# Patient Record
Sex: Male | Born: 1950 | Race: White | Hispanic: No | Marital: Married | Smoking: Never smoker
Health system: Southern US, Community
[De-identification: ages and names within clinical notes are randomized; demographics above are authoritative.]

## PROBLEM LIST (undated history)

## (undated) DIAGNOSIS — Z8639 Personal history of other endocrine, nutritional and metabolic disease: Secondary | ICD-10-CM

## (undated) DIAGNOSIS — N182 Chronic kidney disease, stage 2 (mild): Secondary | ICD-10-CM

## (undated) DIAGNOSIS — M109 Gout, unspecified: Secondary | ICD-10-CM

## (undated) DIAGNOSIS — J069 Acute upper respiratory infection, unspecified: Secondary | ICD-10-CM

## (undated) DIAGNOSIS — I340 Nonrheumatic mitral (valve) insufficiency: Secondary | ICD-10-CM

## (undated) DIAGNOSIS — F419 Anxiety disorder, unspecified: Secondary | ICD-10-CM

## (undated) DIAGNOSIS — I4891 Unspecified atrial fibrillation: Secondary | ICD-10-CM

## (undated) DIAGNOSIS — N400 Enlarged prostate without lower urinary tract symptoms: Secondary | ICD-10-CM

## (undated) DIAGNOSIS — G4730 Sleep apnea, unspecified: Secondary | ICD-10-CM

## (undated) DIAGNOSIS — E669 Obesity, unspecified: Secondary | ICD-10-CM

## (undated) DIAGNOSIS — H6690 Otitis media, unspecified, unspecified ear: Secondary | ICD-10-CM

## (undated) DIAGNOSIS — N3941 Urge incontinence: Secondary | ICD-10-CM

## (undated) DIAGNOSIS — E291 Testicular hypofunction: Secondary | ICD-10-CM

## (undated) HISTORY — DX: Testicular hypofunction: E29.1

## (undated) HISTORY — DX: Anxiety disorder, unspecified: F41.9

## (undated) HISTORY — DX: Benign prostatic hyperplasia without lower urinary tract symptoms: N40.0

## (undated) HISTORY — DX: Chronic kidney disease, stage 2 (mild): N18.2

## (undated) HISTORY — DX: Otitis media, unspecified, unspecified ear: H66.90

## (undated) HISTORY — DX: Unspecified atrial fibrillation: I48.91

## (undated) HISTORY — DX: Nonrheumatic mitral (valve) insufficiency: I34.0

## (undated) HISTORY — DX: Sleep apnea, unspecified: G47.30

## (undated) HISTORY — DX: Gout, unspecified: M10.9

## (undated) HISTORY — DX: Personal history of other endocrine, nutritional and metabolic disease: Z86.39

## (undated) HISTORY — DX: Urge incontinence: N39.41

## (undated) HISTORY — PX: TRANSTHORACIC ECHOCARDIOGRAM: SHX275

## (undated) HISTORY — DX: Acute upper respiratory infection, unspecified: J06.9

## (undated) HISTORY — DX: Obesity, unspecified: E66.9

---

## 1998-03-07 HISTORY — PX: HEMORRHOID SURGERY: SHX153

## 2001-03-07 HISTORY — PX: COLONOSCOPY: SHX174

## 2003-07-21 ENCOUNTER — Inpatient Hospital Stay (HOSPITAL_BASED_OUTPATIENT_CLINIC_OR_DEPARTMENT_OTHER): Admission: RE | Admit: 2003-07-21 | Discharge: 2003-07-21 | Payer: Self-pay | Admitting: Cardiovascular Disease

## 2004-05-13 ENCOUNTER — Ambulatory Visit: Payer: Self-pay | Admitting: Family Medicine

## 2005-01-17 ENCOUNTER — Ambulatory Visit: Payer: Self-pay | Admitting: Family Medicine

## 2005-02-11 ENCOUNTER — Ambulatory Visit: Payer: Self-pay | Admitting: Family Medicine

## 2005-03-07 HISTORY — PX: MITRAL VALVE REPAIR: SHX2039

## 2005-03-29 ENCOUNTER — Ambulatory Visit: Payer: Self-pay | Admitting: Family Medicine

## 2005-04-18 ENCOUNTER — Ambulatory Visit: Payer: Self-pay | Admitting: Family Medicine

## 2005-05-04 ENCOUNTER — Ambulatory Visit: Payer: Self-pay | Admitting: Family Medicine

## 2005-06-16 ENCOUNTER — Ambulatory Visit: Payer: Self-pay | Admitting: Family Medicine

## 2005-06-27 ENCOUNTER — Ambulatory Visit: Payer: Self-pay | Admitting: Family Medicine

## 2005-07-18 ENCOUNTER — Ambulatory Visit: Payer: Self-pay | Admitting: Family Medicine

## 2005-11-29 ENCOUNTER — Encounter: Payer: Self-pay | Admitting: Cardiovascular Disease

## 2005-11-29 ENCOUNTER — Ambulatory Visit: Payer: Self-pay

## 2005-12-08 ENCOUNTER — Ambulatory Visit: Payer: Self-pay | Admitting: Cardiovascular Disease

## 2005-12-12 ENCOUNTER — Ambulatory Visit (HOSPITAL_COMMUNITY): Admission: RE | Admit: 2005-12-12 | Discharge: 2005-12-12 | Payer: Self-pay | Admitting: Cardiovascular Disease

## 2005-12-12 ENCOUNTER — Ambulatory Visit: Payer: Self-pay | Admitting: Cardiovascular Disease

## 2005-12-26 ENCOUNTER — Ambulatory Visit: Payer: Self-pay | Admitting: Cardiovascular Disease

## 2005-12-28 ENCOUNTER — Ambulatory Visit: Payer: Self-pay | Admitting: Family Medicine

## 2005-12-30 ENCOUNTER — Inpatient Hospital Stay (HOSPITAL_BASED_OUTPATIENT_CLINIC_OR_DEPARTMENT_OTHER): Admission: RE | Admit: 2005-12-30 | Discharge: 2005-12-30 | Payer: Self-pay | Admitting: Cardiovascular Disease

## 2005-12-30 ENCOUNTER — Ambulatory Visit: Payer: Self-pay | Admitting: Cardiovascular Disease

## 2006-01-11 ENCOUNTER — Ambulatory Visit: Payer: Self-pay | Admitting: Family Medicine

## 2006-01-17 ENCOUNTER — Inpatient Hospital Stay (HOSPITAL_COMMUNITY)
Admission: RE | Admit: 2006-01-17 | Discharge: 2006-01-22 | Payer: Self-pay | Admitting: Thoracic Surgery (Cardiothoracic Vascular Surgery)

## 2006-01-17 ENCOUNTER — Encounter (INDEPENDENT_AMBULATORY_CARE_PROVIDER_SITE_OTHER): Payer: Self-pay | Admitting: *Deleted

## 2006-02-02 ENCOUNTER — Ambulatory Visit: Payer: Self-pay | Admitting: Cardiovascular Disease

## 2006-02-14 ENCOUNTER — Ambulatory Visit: Payer: Self-pay | Admitting: Cardiovascular Disease

## 2006-02-23 ENCOUNTER — Ambulatory Visit: Payer: Self-pay | Admitting: Cardiovascular Disease

## 2006-02-23 ENCOUNTER — Ambulatory Visit: Payer: Self-pay | Admitting: Cardiology

## 2006-03-03 ENCOUNTER — Ambulatory Visit (HOSPITAL_COMMUNITY): Admission: RE | Admit: 2006-03-03 | Discharge: 2006-03-03 | Payer: Self-pay | Admitting: Cardiovascular Disease

## 2006-03-03 ENCOUNTER — Ambulatory Visit: Payer: Self-pay | Admitting: Cardiology

## 2006-03-15 ENCOUNTER — Ambulatory Visit: Payer: Self-pay | Admitting: Cardiology

## 2006-04-10 ENCOUNTER — Ambulatory Visit: Payer: Self-pay | Admitting: Cardiology

## 2006-04-10 ENCOUNTER — Ambulatory Visit: Payer: Self-pay | Admitting: Cardiovascular Disease

## 2006-04-17 ENCOUNTER — Ambulatory Visit: Payer: Self-pay | Admitting: Thoracic Surgery (Cardiothoracic Vascular Surgery)

## 2006-04-27 ENCOUNTER — Ambulatory Visit: Payer: Self-pay | Admitting: Family Medicine

## 2006-05-10 ENCOUNTER — Ambulatory Visit: Payer: Self-pay | Admitting: *Deleted

## 2006-06-06 ENCOUNTER — Ambulatory Visit: Payer: Self-pay | Admitting: Family Medicine

## 2006-06-07 ENCOUNTER — Ambulatory Visit: Payer: Self-pay | Admitting: Cardiovascular Disease

## 2006-06-10 ENCOUNTER — Emergency Department (HOSPITAL_COMMUNITY): Admission: EM | Admit: 2006-06-10 | Discharge: 2006-06-10 | Payer: Self-pay | Admitting: Emergency Medicine

## 2006-06-19 ENCOUNTER — Ambulatory Visit: Payer: Self-pay | Admitting: Family Medicine

## 2006-06-23 ENCOUNTER — Ambulatory Visit: Payer: Self-pay | Admitting: Cardiology

## 2006-07-05 ENCOUNTER — Ambulatory Visit: Payer: Self-pay | Admitting: Family Medicine

## 2006-07-20 ENCOUNTER — Ambulatory Visit: Payer: Self-pay | Admitting: Internal Medicine

## 2006-08-17 ENCOUNTER — Ambulatory Visit: Payer: Self-pay | Admitting: Cardiology

## 2006-09-12 ENCOUNTER — Ambulatory Visit: Payer: Self-pay | Admitting: Cardiovascular Disease

## 2006-09-12 ENCOUNTER — Ambulatory Visit: Payer: Self-pay | Admitting: Cardiology

## 2006-09-15 ENCOUNTER — Ambulatory Visit (HOSPITAL_COMMUNITY): Admission: RE | Admit: 2006-09-15 | Discharge: 2006-09-15 | Payer: Self-pay | Admitting: Cardiovascular Disease

## 2006-09-17 ENCOUNTER — Ambulatory Visit: Payer: Self-pay | Admitting: Cardiology

## 2006-11-22 ENCOUNTER — Ambulatory Visit: Payer: Self-pay | Admitting: Cardiology

## 2007-03-15 ENCOUNTER — Ambulatory Visit: Payer: Self-pay

## 2007-03-15 ENCOUNTER — Encounter: Payer: Self-pay | Admitting: Cardiology

## 2007-09-21 IMAGING — CR DG CHEST 1V PORT
1 series · 1 of 1 positions shown · non-contrast
Comparison: Yesterday?s exam.

CLINICAL DATA: MVR.  
 PORTABLE CHEST - 1 VIEW, 3933 HOURS:

[view not recorded]
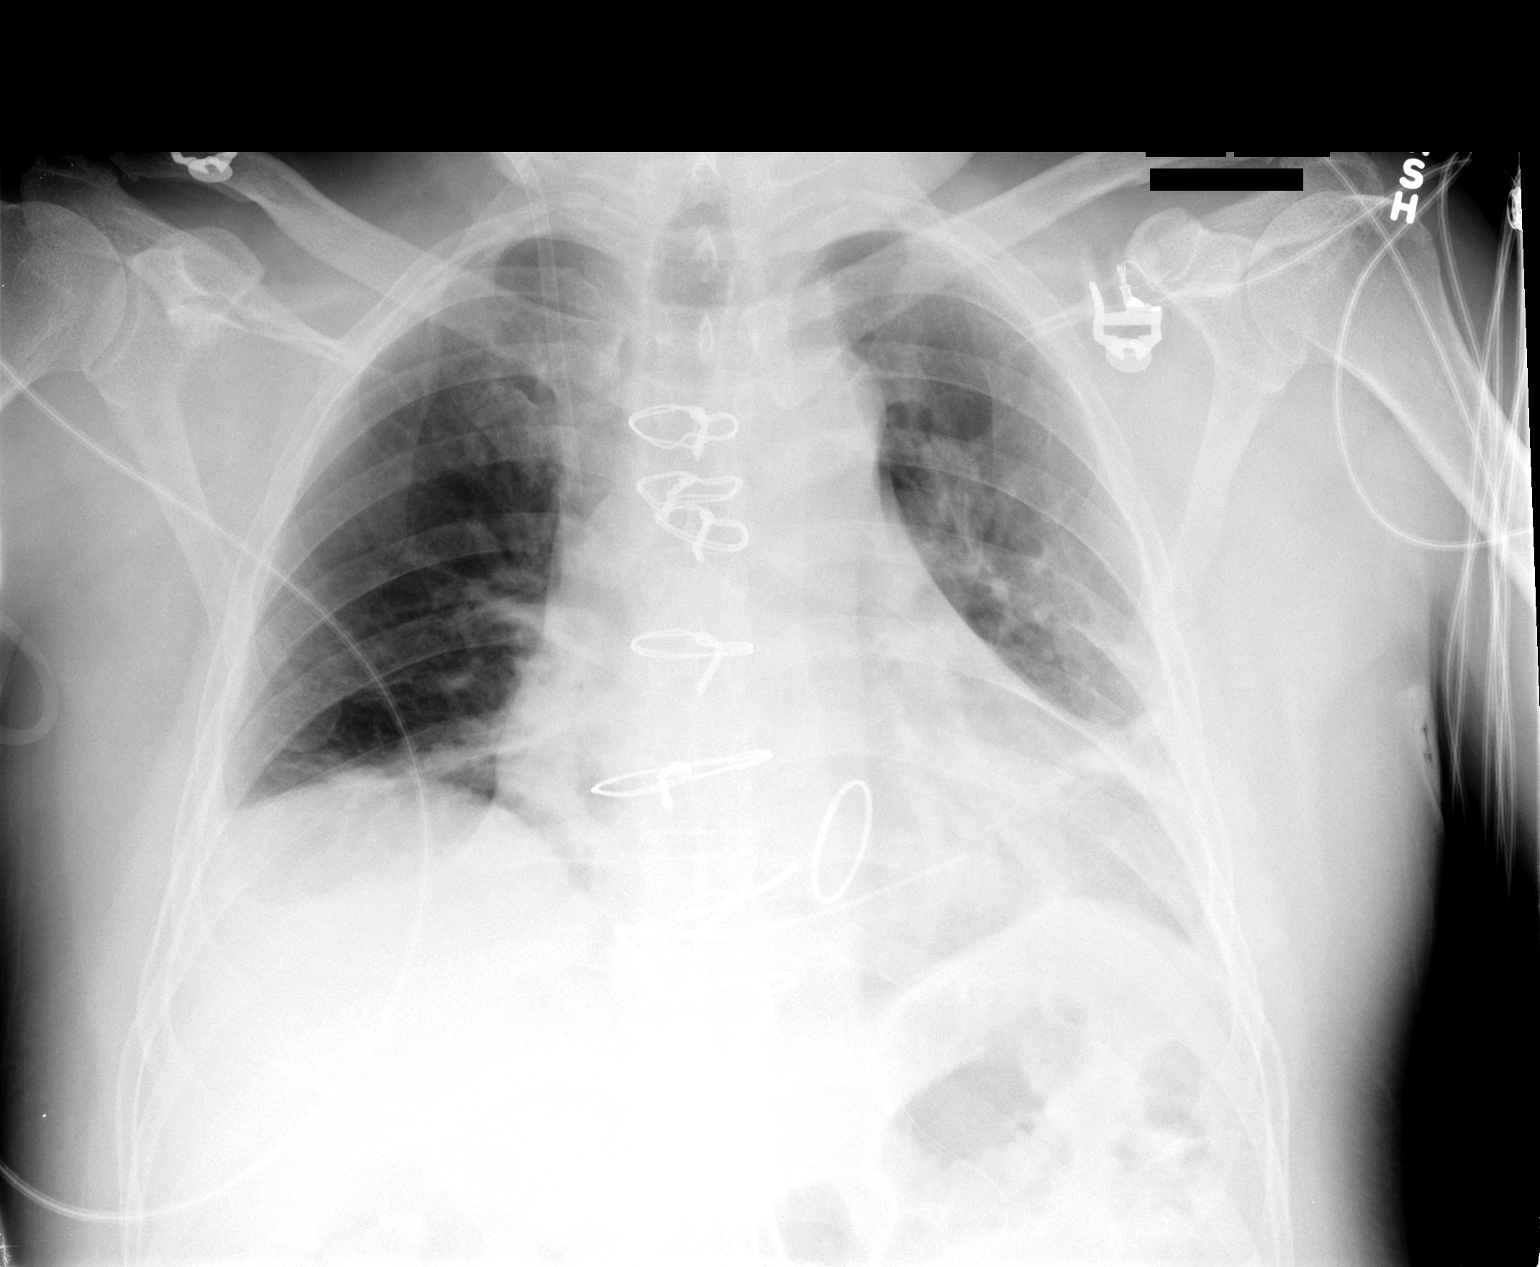

[1 of 1 positions shown; findings below may reference images not displayed]

Mediastinal chest tube remains.  Two chest tubes have been removed.  SGC has been removed with catheter sheath remaining in the right jugular/innominate vein.  Some improvement in aeration of the lungs.  Subsegmental atelectasis in the lower lung zone.  Cardiomediastinal silhouette appears stable.  No pneumothorax.
IMPRESSION: Improvement in lung aeration.  Bilateral subsegmental atelectasis.

## 2007-09-22 IMAGING — CR DG CHEST 2V
2 series · 2 of 2 positions shown · non-contrast
Comparison: 09/18/05.

CLINICAL DATA: Status post surgery for mitral regurgitation.  Follow-up postop status. 
 CHEST - 2 VIEW:

[w chest pa]
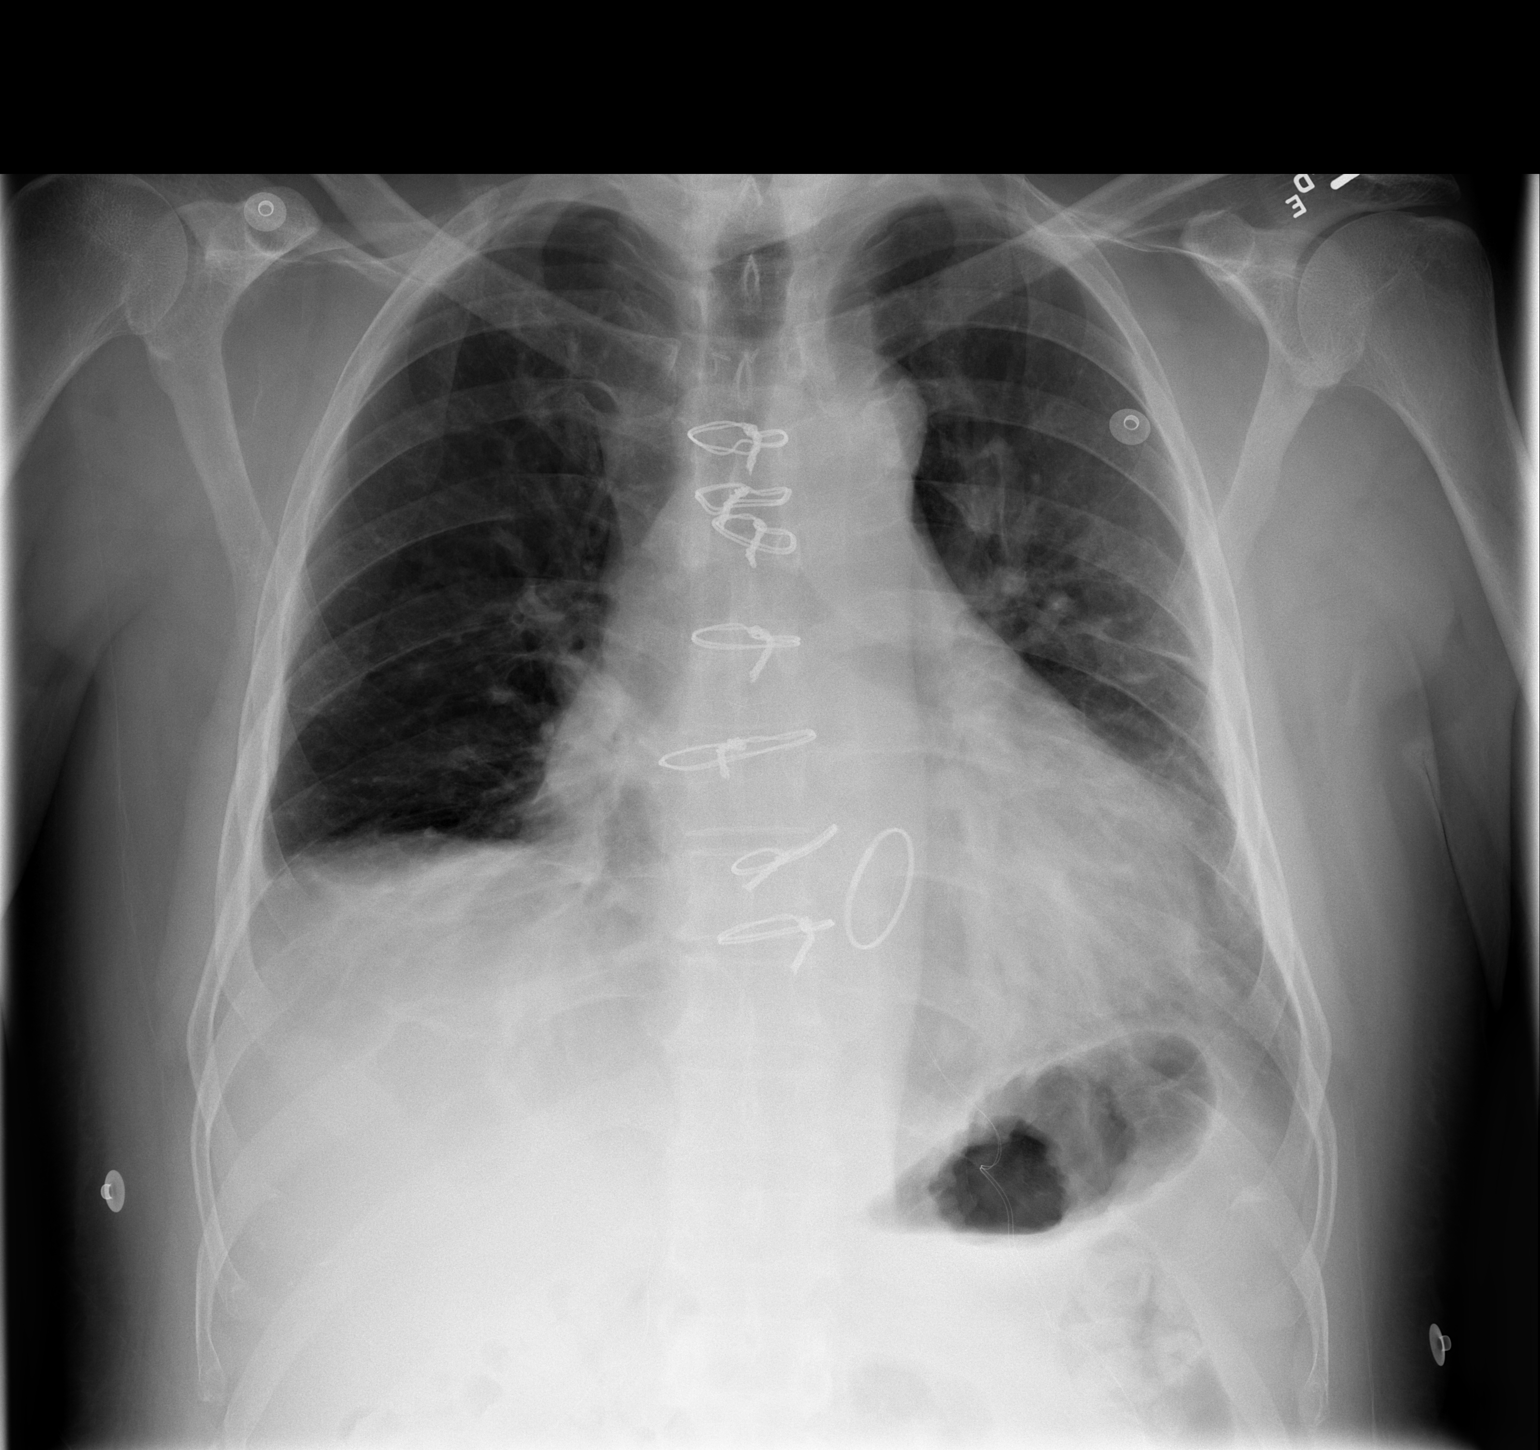

[w chest lat]
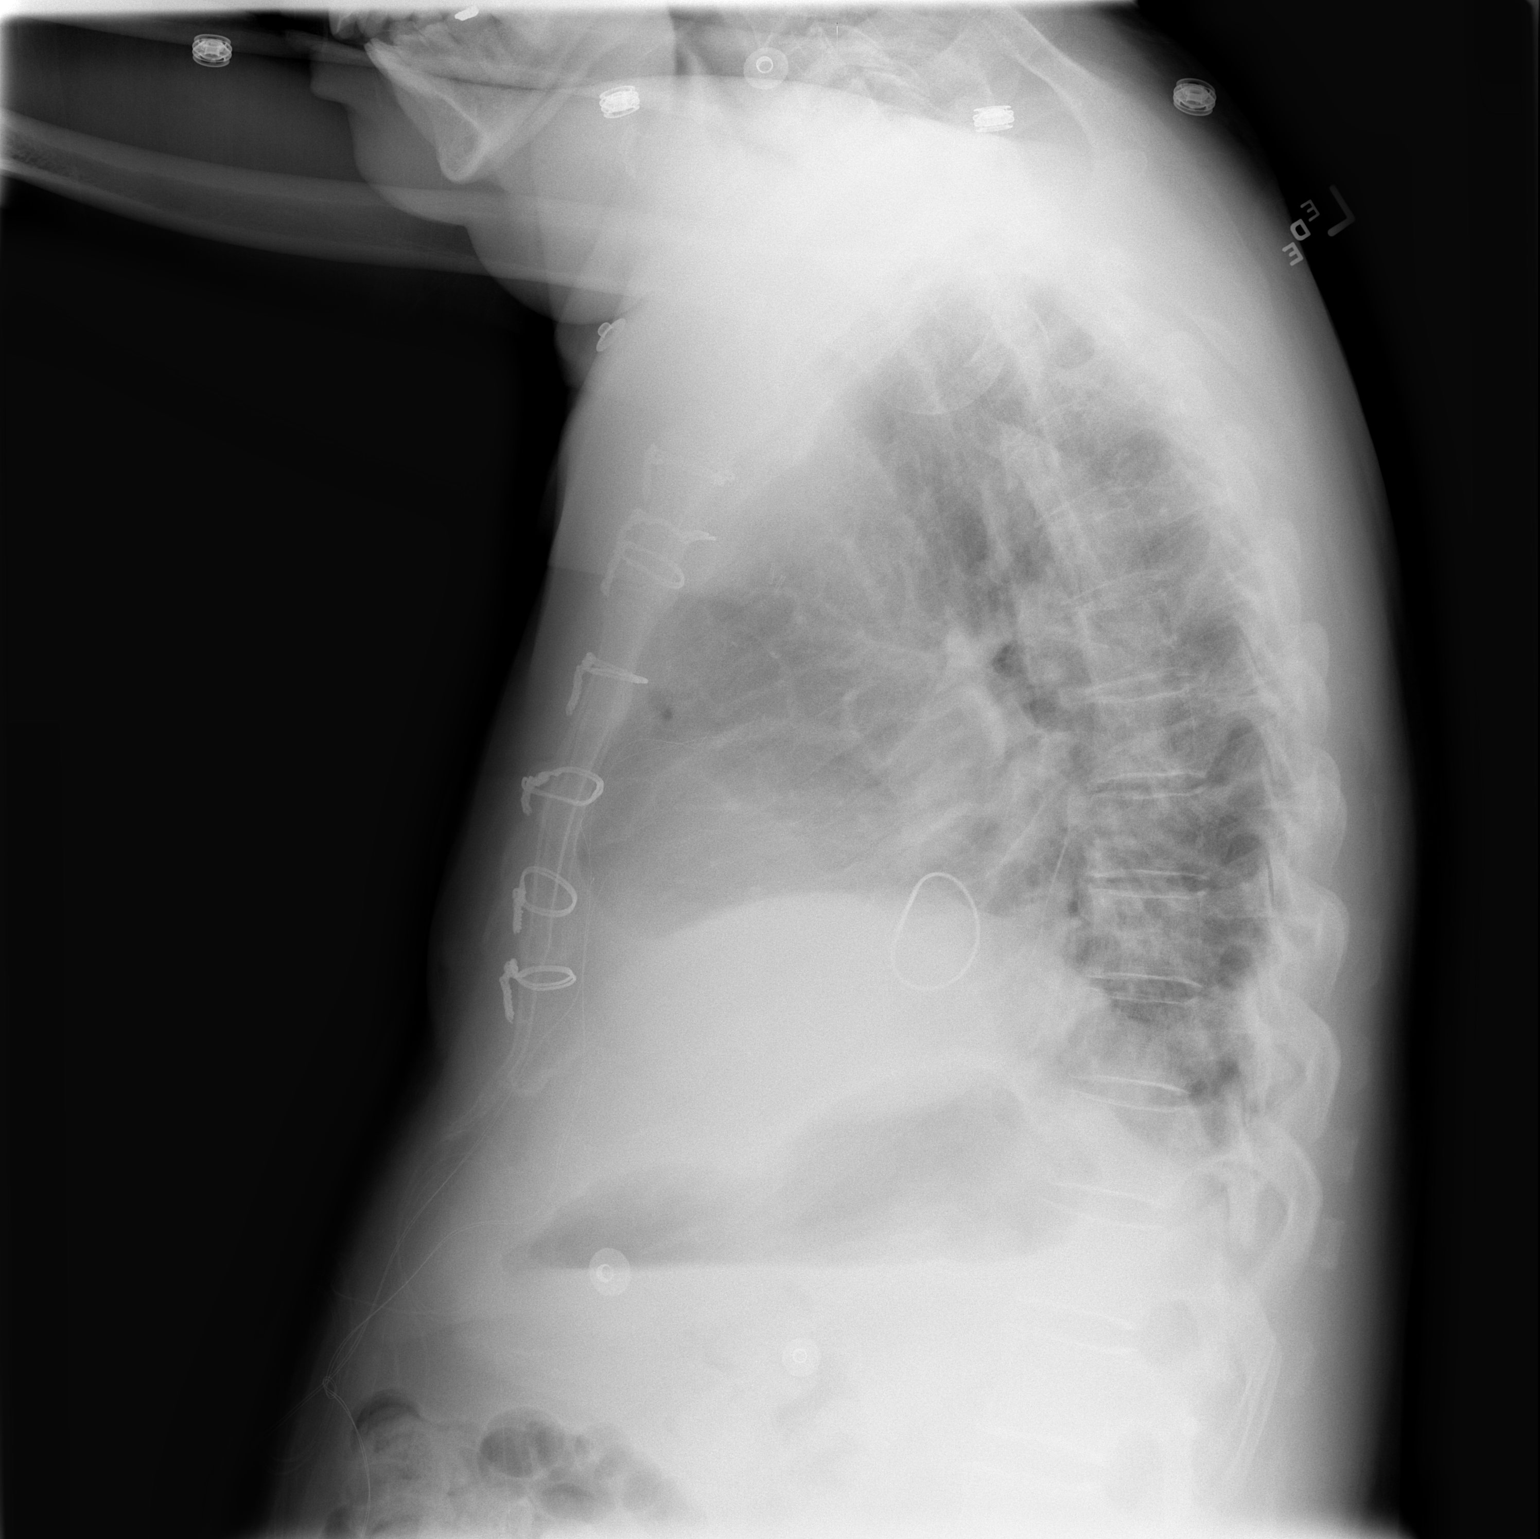

[2 of 2 positions shown; findings below may reference images not displayed]

FINDINGS: Mitral annular ring.  Subsegmental atelectasis at the right base.  Cardiomegaly.  No CHF.  Minimal right pleural effusion.  Removal of chest tube.  Swan Ganz catheter sheath removed as well.
IMPRESSION: Right base subsegmental atelectasis and minimal right pleural effusion.  Cardiomegaly.

## 2008-04-20 DIAGNOSIS — I059 Rheumatic mitral valve disease, unspecified: Secondary | ICD-10-CM | POA: Insufficient documentation

## 2008-04-20 DIAGNOSIS — I080 Rheumatic disorders of both mitral and aortic valves: Secondary | ICD-10-CM

## 2008-04-20 DIAGNOSIS — E669 Obesity, unspecified: Secondary | ICD-10-CM

## 2008-04-20 DIAGNOSIS — Z87898 Personal history of other specified conditions: Secondary | ICD-10-CM | POA: Insufficient documentation

## 2008-04-21 ENCOUNTER — Encounter: Payer: Self-pay | Admitting: Cardiology

## 2008-04-21 ENCOUNTER — Ambulatory Visit: Payer: Self-pay | Admitting: Cardiology

## 2008-04-21 DIAGNOSIS — F410 Panic disorder [episodic paroxysmal anxiety] without agoraphobia: Secondary | ICD-10-CM

## 2008-05-23 ENCOUNTER — Encounter: Payer: Self-pay | Admitting: Cardiology

## 2008-05-23 ENCOUNTER — Ambulatory Visit: Payer: Self-pay

## 2008-07-29 ENCOUNTER — Encounter: Admission: RE | Admit: 2008-07-29 | Discharge: 2008-07-29 | Payer: Self-pay | Admitting: Family Medicine

## 2008-08-14 ENCOUNTER — Encounter: Payer: Self-pay | Admitting: Cardiology

## 2008-08-14 ENCOUNTER — Telehealth: Payer: Self-pay | Admitting: Cardiology

## 2008-08-21 ENCOUNTER — Telehealth: Payer: Self-pay | Admitting: Cardiology

## 2008-10-01 ENCOUNTER — Encounter: Admission: RE | Admit: 2008-10-01 | Discharge: 2008-10-01 | Payer: Self-pay | Admitting: Family Medicine

## 2008-10-29 ENCOUNTER — Ambulatory Visit: Payer: Self-pay | Admitting: Cardiology

## 2008-10-29 ENCOUNTER — Telehealth (INDEPENDENT_AMBULATORY_CARE_PROVIDER_SITE_OTHER): Payer: Self-pay | Admitting: Physician Assistant

## 2008-10-30 ENCOUNTER — Ambulatory Visit: Payer: Self-pay | Admitting: Cardiology

## 2008-10-30 ENCOUNTER — Observation Stay (HOSPITAL_COMMUNITY): Admission: EM | Admit: 2008-10-30 | Discharge: 2008-10-30 | Payer: Self-pay | Admitting: Emergency Medicine

## 2008-10-30 ENCOUNTER — Encounter: Payer: Self-pay | Admitting: Cardiology

## 2008-12-03 ENCOUNTER — Ambulatory Visit: Payer: Self-pay | Admitting: Cardiovascular Disease

## 2008-12-03 DIAGNOSIS — R079 Chest pain, unspecified: Secondary | ICD-10-CM | POA: Insufficient documentation

## 2009-05-26 ENCOUNTER — Encounter: Payer: Self-pay | Admitting: Cardiology

## 2009-05-26 ENCOUNTER — Ambulatory Visit (HOSPITAL_COMMUNITY): Admission: RE | Admit: 2009-05-26 | Discharge: 2009-05-26 | Payer: Self-pay | Admitting: Cardiology

## 2009-05-26 ENCOUNTER — Ambulatory Visit: Payer: Self-pay | Admitting: Cardiology

## 2009-05-26 ENCOUNTER — Ambulatory Visit: Payer: Self-pay

## 2009-06-01 ENCOUNTER — Telehealth: Payer: Self-pay | Admitting: Cardiology

## 2009-06-04 ENCOUNTER — Telehealth: Payer: Self-pay | Admitting: Cardiology

## 2009-06-04 ENCOUNTER — Encounter: Payer: Self-pay | Admitting: Cardiology

## 2009-07-14 ENCOUNTER — Ambulatory Visit: Payer: Self-pay | Admitting: Cardiology

## 2009-12-08 ENCOUNTER — Telehealth: Payer: Self-pay | Admitting: Cardiology

## 2010-01-19 ENCOUNTER — Ambulatory Visit: Payer: Self-pay | Admitting: Cardiology

## 2010-01-19 DIAGNOSIS — R0602 Shortness of breath: Secondary | ICD-10-CM | POA: Insufficient documentation

## 2010-01-22 ENCOUNTER — Ambulatory Visit: Payer: Self-pay | Admitting: Cardiology

## 2010-01-22 ENCOUNTER — Ambulatory Visit (HOSPITAL_COMMUNITY): Admission: RE | Admit: 2010-01-22 | Discharge: 2010-01-22 | Payer: Self-pay | Admitting: Cardiology

## 2010-01-27 ENCOUNTER — Telehealth: Payer: Self-pay | Admitting: Cardiology

## 2010-02-14 ENCOUNTER — Emergency Department (HOSPITAL_COMMUNITY)
Admission: EM | Admit: 2010-02-14 | Discharge: 2010-02-14 | Payer: Self-pay | Source: Home / Self Care | Admitting: Emergency Medicine

## 2010-02-15 ENCOUNTER — Ambulatory Visit (HOSPITAL_COMMUNITY)
Admission: RE | Admit: 2010-02-15 | Discharge: 2010-02-15 | Payer: Self-pay | Source: Home / Self Care | Attending: Cardiology | Admitting: Cardiology

## 2010-02-15 ENCOUNTER — Encounter: Payer: Self-pay | Admitting: Cardiology

## 2010-02-15 ENCOUNTER — Telehealth: Payer: Self-pay | Admitting: Cardiology

## 2010-02-22 ENCOUNTER — Telehealth: Payer: Self-pay | Admitting: Cardiology

## 2010-03-07 DIAGNOSIS — Z8639 Personal history of other endocrine, nutritional and metabolic disease: Secondary | ICD-10-CM

## 2010-03-07 HISTORY — DX: Personal history of other endocrine, nutritional and metabolic disease: Z86.39

## 2010-03-15 ENCOUNTER — Ambulatory Visit: Admission: RE | Admit: 2010-03-15 | Discharge: 2010-03-15 | Payer: Self-pay | Source: Home / Self Care

## 2010-03-28 ENCOUNTER — Encounter: Payer: Self-pay | Admitting: Thoracic Surgery (Cardiothoracic Vascular Surgery)

## 2010-04-06 NOTE — Assessment & Plan Note (Signed)
Summary: [name]   Primary Provider:  Cannon Kettle, FNP   History of Present Illness: The patient returns for followup. Since I last saw him he has had no acute cardiac problems. He is busy and physically active at work. With this he denies any chest discomfort, neck or arm discomfort. He has no shortness of breath and denies any PND or orthopnea. He has no palpitations, presyncope or syncope. He has no weight gain or swelling. I did repeat an echocardiogram in March which demonstrated mild mitral regurgitation and stenosis of his repaired valve but this is clinically and hemodynamically stable.   Current Medications (verified): 1)  Aspirin 81 Mg Tabs (Aspirin) .... One By Mouth Daily 2)  Metoprolol Succinate 25 Mg Xr24h-Tab (Metoprolol Succinate) .... Take 1/2 Daily  Allergies (verified): No Known Drug Allergies  Past History:  Past Medical History: Reviewed history from 04/20/2008 and no changes required. Mitral valve prolapse and regurgitation status  post repair Obesity Postoperative atrial fibrillation Anxiety BPH  Past Surgical History: Reviewed history from 04/20/2008 and no changes required. Median sternotomy for mitral valve repair (quadrangular  resection of posterior leaflet with sliding leaflet annuloplasty, posterior  commissuroplasty, artificial Gore-Tex chord x1, closure of posterior leaflet  cleft, 30 mm Edwards physio ring annuloplasty).  (Dr. Cornelius Moras.  Nov 13/2007)  Review of Systems       As stated in the HPI and negative for all other systems.   Vital Signs:  Patient profile:   60 year old male Height:      67 inches Weight:      188 pounds BMI:     29.55 Pulse rate:   80 / minute Resp:     16 per minute BP sitting:   111 / 76  (left arm)  Vitals Entered By: Marrion Coy, CNA (Jul 14, 2009 4:20 PM)  Physical Exam  General:  Well developed, well nourished, in no acute distress. Head:  normocephalic and atraumatic Eyes:  PERRLA/EOM intact;  conjunctiva and lids normal. Neck:  Neck supple, no JVD. No masses, thyromegaly or abnormal cervical nodes. Chest Wall:  no deformities or breast masses noted Lungs:  Clear bilaterally to auscultation and percussion. Abdomen:  Bowel sounds positive; abdomen soft and non-tender without masses, organomegaly, or hernias noted. No hepatosplenomegaly. Msk:  Back normal, normal gait. Muscle strength and tone normal. Extremities:  No clubbing or cyanosis. Neurologic:  Alert and oriented x 3. Skin:  Intact without lesions or rashes. Cervical Nodes:  no significant adenopathy Inguinal Nodes:  no significant adenopathy Psych:  Normal affect.   Detailed Cardiovascular Exam  Neck    Carotids: Carotids full and equal bilaterally without bruits.      Neck Veins: Normal, no JVD.    Heart    Inspection: no deformities or lifts noted.      Palpation: normal PMI with no thrills palpable.      Auscultation: regular rate and rhythm, S1, S2 without murmurs, rubs, gallops, or clicks.    Vascular    Abdominal Aorta: no palpable masses, pulsations, or audible bruits.      Femoral Pulses: normal femoral pulses bilaterally.      Pedal Pulses: pulses normal in all 4 extremities    Radial Pulses: normal radial pulses bilaterally.      Peripheral Circulation: no clubbing, cyanosis, or edema noted with normal capillary refill.     EKG  Procedure date:  07/14/2009  Findings:      Sinus rhythm, rate 80, axis within normal limits,  intervals within normal limits, no acute ST-T wave changes.  Impression & Recommendations:  Problem # 1:  * MITRAL VALVE REPAIR Is mitral valve seems to be stable status post repair. He understands endocarditis prophylaxis. No further change in therapy is indicated. No further imaging is indicated.  Problem # 2:  CHEST PAIN-UNSPECIFIED (ICD-786.50) He has had none of the chest pain or other symptoms that he had that were so severe in the past months. He was hospitalized for  this previously. No further cardiovascular testing is suggested. Orders: EKG w/ Interpretation (93000)  Patient Instructions: 1)  Your physician recommends that you schedule a follow-up appointment in: 1 yr with Dr Antoine Poche 2)  Your physician recommends that you continue on your current medications as directed. Please refer to the Current Medication list given to you today. 3)  Your physician has requested that you have an echocardiogram in 1 yr.  Echocardiography is a painless test that uses sound waves to create images of your heart. It provides your doctor with information about the size and shape of your heart and how well your heart's chambers and valves are working.  This procedure takes approximately one hour. There are no restrictions for this procedure.

## 2010-04-06 NOTE — Miscellaneous (Signed)
  Clinical Lists Changes  Observations: Added new observation of ECHOINTERP:   - Left ventricle: The cavity size was normal. Wall thickness was       increased in a pattern of mild LVH. The estimated ejection       fraction was 60%.     - Aortic valve: Sclerosis without stenosis. Mild regurgitation.     - Mitral valve: The mitral valve has been treated in the past. I do       not have the specifics. There is MR. It is probably mild, but       difficult to assess. There may be mild inflow obstruction. Valve       area by continuity equation (using LVOT flow): 1.26cm 2.     - Left atrium: The atrium was mildly dilated.     - Right ventricle: The cavity size was moderately dilated. Systolic       function was moderately reduced. (05/26/2009 9:08)      Echocardiogram  Procedure date:  05/26/2009  Findings:        - Left ventricle: The cavity size was normal. Wall thickness was       increased in a pattern of mild LVH. The estimated ejection       fraction was 60%.     - Aortic valve: Sclerosis without stenosis. Mild regurgitation.     - Mitral valve: The mitral valve has been treated in the past. I do       not have the specifics. There is MR. It is probably mild, but       difficult to assess. There may be mild inflow obstruction. Valve       area by continuity equation (using LVOT flow): 1.26cm 2.     - Left atrium: The atrium was mildly dilated.     - Right ventricle: The cavity size was moderately dilated. Systolic       function was moderately reduced.

## 2010-04-06 NOTE — Progress Notes (Signed)
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 He is having trouble with extreme fatigue,  Doesn't get SOB just very tired, by the time he gets off work he just goes home and goes to bed,  started about 2 weeks ago - didn't happen when it was real hot.   Cannon Kettle is listed as his primary care (NP)  Pt very anxious about what to do.  He is going to start taking a multivitamin but he is "afraid it's his heart".  Again, he has no other s/s Follow-up by: Charolotte Capuchin, RN,  December 08, 2009 2:48 PM  Additional Follow-up for Phone Call Additional follow up Details #1::        We will schedule a follow up appt. Additional Follow-up by: Rollene Rotunda, MD, [facility],  December 09, 2009 1:27 PM    Additional Follow-up for Phone Call Additional follow up Details #2::    APPT GIVEN FOR 12/16/2009 AT 11AM.  PT AWARE AND AGREEABLE Follow-up by: Charolotte Capuchin, RN,  December 09, 2009 2:16 PM

## 2010-04-06 NOTE — Assessment & Plan Note (Signed)
Summary: ROV   Visit Type:  Follow-up Primary Provider:  Cannon Kettle, FNP  CC:  MVR and SOB.  History of Present Illness: The patient presents for followup of mitral valve repair. His echo in March of this year suggested possible mild regurgitation but the views were somewhat obscured. He now presents with progressive dyspnea on exertion. This is worse than it was when I saw him earlier this year. It is almost back to where he was prior to surgery. 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 He does not describe chest pressure, neck or arm discomfort. He does not describe palpitations, presyncope or syncope. He does not describe PND or orthopnea. He denies any weight gain or edema.  Current Medications (verified): 1)  Aspirin 81 Mg Tabs (Aspirin) .... One By Mouth Daily 2)  Metoprolol Succinate 25 Mg Xr24h-Tab (Metoprolol Succinate) .... Take 1/2 Daily  Allergies (verified): No Known Drug Allergies  Past History:  Past Medical History: Reviewed history from 04/20/2008 and no changes required. Mitral valve prolapse and regurgitation status  post repair Obesity Postoperative atrial fibrillation Anxiety BPH  Past Surgical History: Reviewed history from 04/20/2008 and no changes required. Median sternotomy for mitral valve repair (quadrangular  resection of posterior leaflet with sliding leaflet annuloplasty, posterior  commissuroplasty, artificial Gore-Tex chord x1, closure of posterior leaflet  cleft, 30 mm Edwards physio ring annuloplasty).  (Dr. Cornelius Moras.  Nov 13/2007)  Review of Systems       As stated in the HPI and negative for all other systems.   Vital Signs:  Patient profile:   60 year old male Height:      67 inches Weight:      189 pounds BMI:     29.71 Pulse rate:   78 / minute Resp:     16 per minute BP sitting:   110 / 72  (right arm)  Vitals Entered By: Marrion Coy, CNA (January 19, 2010 2:56 PM)  Physical  Exam  General:  Well developed, well nourished, in no acute distress. Head:  normocephalic and atraumatic Eyes:  PERRLA/EOM intact; conjunctiva and lids normal. Mouth:  Teeth, gums and palate normal. Oral mucosa normal. Neck:  Neck supple, no JVD. No masses, thyromegaly or abnormal cervical nodes. Chest Wall:  no deformities or breast masses noted Lungs:  Clear bilaterally to auscultation and percussion. Abdomen:  Bowel sounds positive; abdomen soft and non-tender without masses, organomegaly, or hernias noted. No hepatosplenomegaly. Msk:  Back normal, normal gait. Muscle strength and tone normal. Extremities:  No clubbing or cyanosis. Neurologic:  Alert and oriented x 3. Skin:  Intact without lesions or rashes. Cervical Nodes:  no significant adenopathy Axillary Nodes:  no significant adenopathy Inguinal Nodes:  no significant adenopathy Psych:  Normal affect.   Detailed Cardiovascular Exam  Neck    Carotids: Carotids full and equal bilaterally without bruits.      Neck Veins: Normal, no JVD.    Heart    Inspection: no deformities or lifts noted.      Palpation: normal PMI with no thrills palpable.      Auscultation: regular rate and rhythm, S1, S2 minimal systolic murmur heard at the apex only, no diastolic murmurs  Vascular    Abdominal Aorta: no palpable masses, pulsations, or audible bruits.      Femoral Pulses: normal femoral pulses bilaterally.      Pedal Pulses: normal pedal pulses bilaterally.      Radial Pulses:  normal radial pulses bilaterally.      Peripheral Circulation: no clubbing, cyanosis, or edema noted with normal capillary refill.     EKG  Procedure date:  01/19/2010  Findings:      Sinus rhythm, rate 78, axis within normal limits, intervals within normal limits, no acute ST-T wave changes  Impression & Recommendations:  Problem # 1:  * MITRAL VALVE REPAIR The patient had progressive dyspnea. I reviewed the results of his most recent echo. It is  clear that these images could be hiding more significant regurgitation. Therefore, transesophageal echocardiography is indicated.  Problem # 2:  CHEST PAIN-UNSPECIFIED (ICD-786.50) He is having no further chest discomfort.  Problem # 3:  OBESITY (ICD-278.00) He and I have discussed weight loss with diet and exercise.

## 2010-04-06 NOTE — Letter (Signed)
Summary: Cardioversion/TEE Instructions  Architectural technologist, Main Office  [address]   [place], [place] [address]   Phone: [phone]  Fax: [phone]    Cardioversion / TEE Cardioversion Instructions  01/19/2010 MRN: [id number]  [facility] [address] [place], [place]  [address]  Dear [name],  You are scheduled for a TEE  on Friday January 22, 2010 with Dr. Marca Ancona   Please arrive at the [facility] at 11 a.m. on the day of your procedure.  1)   DIET:   A)   May have clear liquid breakfast, then nothing to eat or drink after  7 a.m.     Clear liquids include:  water, broth, Sprite, Ginger Ale, black coffee, tea (no sugar),      cranberry / grape / apple juice, jello (not red), popsicle from clear juices (not red). Marland Kitchen  3)   MAKE SURE YOU TAKE YOUR COUMADIN.   B)   YOU MAY TAKE ALL of your remaining medications with a small amount of water.   4)  Must have a responsible person to drive you home.  5)   Bring a current list of your medications and current insurance cards.   * Special Note:  Every effort is made to have your procedure done on time. Occasionally there are emergencies that present themselves at the hospital that may cause delays. Please be patient if a delay does occur.  * If you have any questions after you get home, please call the office at [phone].

## 2010-04-06 NOTE — Progress Notes (Signed)
Summary: appt  Phone Note Call from Patient Call back at [phone]   Caller: Patient Reason for Call: Talk to Nurse Summary of Call: wants to know if he needs to keep his appt since his echo results came back normal Initial call taken by: Migdalia Dk,  June 04, 2009 1:26 PM  Follow-up for Phone Call        Spoke with pt. Patient would like to know if he needs to keeping the appointment with [facility] on May 10th ,2011 becaouse his echo results were normal. RN let pt. know this is a 12 month F/U appointment he has with Dr. Antoine Poche. Pt verbalized understanding. Follow-up by: Ollen Gross, RN, BSN,  June 04, 2009 1:51 PM

## 2010-04-06 NOTE — Progress Notes (Signed)
Summary: test results (review w/ [facility]) [phone] CELL NUMBER  Phone Note Call from Patient Call back at [phone]   Caller: Patient Reason for Call: Talk to Nurse, Lab or Test Results Summary of Call: TEE Cardioversion results  Follow-up for Phone Call        I called and spoke with the pt. I made him aware that Dr. Antoine Poche has not reviewed the results of his TEE. I did make him aware that Dr. Shirlee Latch read his MR as moderate. I explained I would contact Dr. Antoine Poche in the [place] office to see if he can review the pt's TEE results. The pt is quite concerned because he does not feel well. He is SOB and states it is getting very hard for him to do his job. He works for a [employer], [employer] and has to pull hoses to contact propane. I explained again I would call to see if Dr. Antoine Poche can review the TEE results and that he should hear something back from our office today. He is agreeable with this. Follow-up by: Sherri Rad, RN, BSN,  January 27, 2010 9:51 AM  Additional Follow-up for Phone Call Additional follow up Details #1::        Spoke with Dr Antoine Poche who is aware to review the results of the pts TEE.  Once reviewed, we will call the patient with recommendations  Sander Nephew, RN    Additional Follow-up for Phone Call Additional follow up Details #2::    I tried to call him back.  He needs a CPX to try to sort this out.  This needs to be done ASAP  Follow-up by: Rollene Rotunda, MD, [facility],  January 27, 2010 1:06 PM  Additional Follow-up for Phone Call Additional follow up Details #3:: Details for Additional Follow-up Action Taken: placed order for test.  will continue to try to get in touch with pt.  Sander Nephew, RN  Dr Antoine Poche spoke with pt 01/27/2010 about the need for further testing Additional Follow-up by: Charolotte Capuchin, RN,  January 29, 2010 9:15 AM

## 2010-04-06 NOTE — Progress Notes (Signed)
Summary: OK to Cancel 06/05/09 appt reschedule for 4 months  Phone Note Call from Patient Call back at Home Phone [phone]   Caller: Patient Reason for Call: Talk to Nurse Summary of Call: Echo results were good.  Patient would like to know if the appointment is necessary on Friday 06-05-09.  Ok to leave message if he is not home.  Initial call taken by: Burnard Leigh,  June 01, 2009 4:07 PM  Follow-up for Phone Call        OK to cancel follow up apt 4/1.  Reschedule follow up in 4 months. Follow-up by: Rollene Rotunda, MD, [facility],  June 01, 2009 8:25 PM  Additional Follow-up for Phone Call Additional follow up Details #1::        Daughter aware.  Appt to be canceled and reminder for 4 months to be placed in system Additional Follow-up by: Charolotte Capuchin, RN,  June 02, 2009 3:46 PM

## 2010-04-06 NOTE — Miscellaneous (Signed)
Summary: Orders Update  Clinical Lists Changes  Problems: Added new problem of DYSPNEA (ICD-786.05) Orders: Added new Service order of EKG w/ Interpretation (93000) - Signed Added new Test order of TLB-BNP (B-Natriuretic Peptide) (83880-BNPR) - Signed

## 2010-04-08 NOTE — Assessment & Plan Note (Signed)
Summary: to discuss heart cath d/t SOB  pfh,rn   Visit Type:  Follow-up Primary Provider:  Cannon Kettle, FNP  CC:  MR.  History of Present Illness: The patient returns for followup of mitral regurgitation. He has now had an extensive evaluation of his recent complaints of dyspnea. Transthoracic echo demonstrated that his mitral regurgitation is moderate but not severe. A cardiopulmonary stress test demonstrated only a mild limitation with no evidence for any ischemic or primary pulmonary etiology. BNP was 17.  Today he reports no change in his symptoms other than to say that he does feel better when the weather is cooler. He still gets fatigued by the end of the day. 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 He does not have resting shortness of breath, PND or orthopnea. He is not having chest pressure, neck or arm discomfort. He is having no palpitations, presyncope or syncope.  Current Medications (verified): 1)  Aspirin 81 Mg Tabs (Aspirin) .... One By Mouth Daily 2)  Metoprolol Succinate 25 Mg Xr24h-Tab (Metoprolol Succinate) .... Take 1/2 Daily 3)  Vesicare 10 Mg Tabs (Solifenacin Succinate) .Marland Kitchen.. 1 By Mouth Daily  Allergies (verified): No Known Drug Allergies  Past History:  Past Medical History: Reviewed history from 04/20/2008 and no changes required. Mitral valve prolapse and regurgitation status  post repair Obesity Postoperative atrial fibrillation Anxiety BPH  Past Surgical History: Reviewed history from 04/20/2008 and no changes required. Median sternotomy for mitral valve repair (quadrangular  resection of posterior leaflet with sliding leaflet annuloplasty, posterior  commissuroplasty, artificial Gore-Tex chord x1, closure of posterior leaflet  cleft, 30 mm Edwards physio ring annuloplasty).  (Dr. Cornelius Moras.  Nov 13/2007)  Review of Systems       As stated in the HPI and negative for all other  systems.   Vital Signs:  Patient profile:   60 year old male Height:      67 inches Weight:      188 pounds BMI:     29.55 Pulse rate:   80 / minute Resp:     16 per minute BP sitting:   122 / 77  (right arm)  Vitals Entered By: Marrion Coy, CNA (March 15, 2010 4:15 PM)  Physical Exam  General:  Well developed, well nourished, in no acute distress. Head:  normocephalic and atraumatic Eyes:  PERRLA/EOM intact; conjunctiva and lids normal. Neck:  Neck supple, no JVD. No masses, thyromegaly or abnormal cervical nodes. Chest Wall:  Well-healed surgical scar Lungs:  Clear bilaterally to auscultation and percussion. Abdomen:  Bowel sounds positive; abdomen soft and non-tender without masses, organomegaly, or hernias noted. No hepatosplenomegaly. Msk:  Back normal, normal gait. Muscle strength and tone normal. Extremities:  No clubbing or cyanosis. Neurologic:  Alert and oriented x 3. Skin:  Intact without lesions or rashes. Cervical Nodes:  no significant adenopathy Inguinal Nodes:  no significant adenopathy Psych:  Normal affect.   Detailed Cardiovascular Exam  Neck    Carotids: Carotids full and equal bilaterally without bruits.      Neck Veins: Normal, no JVD.    Heart    Inspection: no deformities or lifts noted.      Palpation: normal PMI with no thrills palpable.      Auscultation: regular rate and rhythm, S1, S2 minimal systolic murmur heard at the apex only, no diastolic murmurs  Vascular    Abdominal Aorta: no palpable masses, pulsations,  or audible bruits.      Femoral Pulses: normal femoral pulses bilaterally.      Pedal Pulses: normal pedal pulses bilaterally.      Radial Pulses: normal radial pulses bilaterally.      Peripheral Circulation: no clubbing, cyanosis, or edema noted with normal capillary refill.     Impression & Recommendations:  Problem # 1:  DYSPNEA (ICD-786.05) At this point he and I have discussed his symptoms and he does not drink and  is limiting and not from further evaluation given the negative results to date. He certainly does have mitral regurgitation which will be followed closely but I do not think this is causing his problem. If he continues to have increased dyspnea next with the right heart catheterization. He understands endocarditis prophylaxis.  Problem # 2:  * MITRAL VALVE REPAIR I will check another chest thoracic echo in May which will be 6 months from the time of his last ultrasound. I will keep a close eye on his moderate mitral regurgitation.  Problem # 3:  CHEST PAIN-UNSPECIFIED (ICD-786.50) The patient denies any ongoing chest pain. He had normal coronaries in 2007 no evidence of ischemia on a cardiopulmonary stress test. No further cardiovascular workup of this is indicated.  Other Orders: Echocardiogram (Echo)  Patient Instructions: 1)  Your physician recommends that you schedule a follow-up appointment in: 3 months with Dr Antoine Poche 2)  Your physician recommends that you continue on your current medications as directed. Please refer to the Current Medication list given to you today. 3)  Your physician has requested that you have an echocardiogram in May 2012.  Echocardiography is a painless test that uses sound waves to create images of your heart. It provides your doctor with information about the size and shape of your heart and how well your heart's chambers and valves are working.  This procedure takes approximately one hour. There are no restrictions for this procedure.

## 2010-04-08 NOTE — Progress Notes (Signed)
Summary: pt calling CPX results  appt scheduled  Phone Note Call from Patient   Caller: Patient [phone] Reason for Call: Talk to Nurse, Lab or Test Results Action Taken: Appt Scheduled Summary of Call: pt calling for results of stress test Initial call taken by: Glynda Jaeger,  February 22, 2010 4:38 PM  Follow-up for Phone Call        PT [facility] FOR TEST RESULTS [name]  February 23, 2010 2:57 PM  Left message for pt that test has not been read yet but should be read this week and I will call him with the results as soon as I have them  02/24/2010 Dr Gala Romney aware and will read this afternoon.  02/25/2010 No results in echart as of yet 4:50 pm  Sander Nephew, RN Follow-up by: Charolotte Capuchin, RN,  February 23, 2010 3:17 PM  Additional Follow-up for Phone Call Additional follow up Details #1::        TEE with moderate MR.  CPX does not suggest ischemia or severe pulmonary limitaiton.  BNP is normal.  The next test, since he is very symptomatic would be a right heart cath.  I would like to see him in the office this week to discuss if he wants to pursue further work up.  per pt calling back re test results. [phone]    [name]  March 03, 2010 12:22 PM Pt returning call call back number [phone] [name]  March 04, 2010 11:13 AM Additional Follow-up by: Rollene Rotunda, MD, [facility],  March 01, 2010 10:59 AM    Additional Follow-up for Phone Call Additional follow up Details #2::    Called patient and left message on machine to call back to discuss CPX results and schedule appt to discuss possible cath.  Sander Nephew, RN  10:00 03/04/10  Pt aware of results and scheduled to see Dr Antoine Poche 03/15/2010 at 4 pm Follow-up by: Charolotte Capuchin, RN,  March 04, 2010 12:47 PM

## 2010-04-08 NOTE — Progress Notes (Signed)
Summary: re today appt for  stress test  Phone Note Call from Patient Call back at Home Phone [phone]   Caller: Patient Reason for Call: Talk to Nurse Summary of Call: pt has a stress today pt would like to talk to a nurse before the test. pt states he was in er last night [place]. pt states he had test done at the hospital. Initial call taken by: Roe Coombs,  February 15, 2010 9:52 AM  Follow-up for Phone Call        per Dr Antoine Poche pt still needs CPX testing.  pt aware Follow-up by: Charolotte Capuchin, RN,  February 15, 2010 12:13 PM

## 2010-05-17 LAB — TROPONIN I: Troponin I: 0.03 ng/mL (ref 0.00–0.06)

## 2010-05-17 LAB — URINALYSIS, ROUTINE W REFLEX MICROSCOPIC
Glucose, UA: NEGATIVE mg/dL
Hgb urine dipstick: NEGATIVE
Specific Gravity, Urine: 1.012 (ref 1.005–1.030)
pH: 6 (ref 5.0–8.0)

## 2010-05-17 LAB — DIFFERENTIAL
Eosinophils Relative: 3 % (ref 0–5)
Lymphocytes Relative: 32 % (ref 12–46)
Monocytes Absolute: 0.5 10*3/uL (ref 0.1–1.0)
Monocytes Relative: 6 % (ref 3–12)
Neutro Abs: 5.1 10*3/uL (ref 1.7–7.7)

## 2010-05-17 LAB — CBC
HCT: 45.2 % (ref 39.0–52.0)
Hemoglobin: 15.5 g/dL (ref 13.0–17.0)
MCH: 29.2 pg (ref 26.0–34.0)
MCHC: 34.3 g/dL (ref 30.0–36.0)
RDW: 12.6 % (ref 11.5–15.5)

## 2010-05-17 LAB — POCT CARDIAC MARKERS
Myoglobin, poc: 44.9 ng/mL (ref 12–200)
Myoglobin, poc: 65.3 ng/mL (ref 12–200)
Troponin i, poc: <0.05 ng/mL (ref 0.00–0.09)

## 2010-05-17 LAB — BASIC METABOLIC PANEL
BUN: 19 mg/dL (ref 6–23)
CO2: 25 mEq/L (ref 19–32)
Calcium: 9.4 mg/dL (ref 8.4–10.5)
GFR calc non Af Amer: 59 mL/min — ABNORMAL LOW (ref >60)
Glucose, Bld: 103 mg/dL — ABNORMAL HIGH (ref 70–99)
Sodium: 139 mEq/L (ref 135–145)

## 2010-05-17 LAB — CK TOTAL AND CKMB (NOT AT ARMC): Relative Index: INVALID (ref 0.0–2.5)

## 2010-06-12 LAB — DIFFERENTIAL
Basophils Absolute: 0.1 10*3/uL (ref 0.0–0.1)
Basophils Relative: 1 % (ref 0–1)
Eosinophils Absolute: 0.3 10*3/uL (ref 0.0–0.7)
Neutro Abs: 3.4 10*3/uL (ref 1.7–7.7)
Neutrophils Relative %: 45 % (ref 43–77)

## 2010-06-12 LAB — CBC
HCT: 46.1 % (ref 39.0–52.0)
Hemoglobin: 15.7 g/dL (ref 13.0–17.0)
MCHC: 34.2 g/dL (ref 30.0–36.0)
MCV: 86.5 fL (ref 78.0–100.0)
Platelets: 197 10*3/uL (ref 150–400)
RBC: 5.33 MIL/uL (ref 4.22–5.81)
RDW: 13.1 % (ref 11.5–15.5)
WBC: 7.5 10*3/uL (ref 4.0–10.5)

## 2010-06-12 LAB — POCT CARDIAC MARKERS
CKMB, poc: <1 ng/mL — ABNORMAL LOW (ref 1.0–8.0)
CKMB, poc: <1 ng/mL — ABNORMAL LOW (ref 1.0–8.0)
CKMB, poc: <1 ng/mL — ABNORMAL LOW (ref 1.0–8.0)
Myoglobin, poc: 55.3 ng/mL (ref 12–200)
Myoglobin, poc: 59.7 ng/mL (ref 12–200)
Myoglobin, poc: 60.3 ng/mL (ref 12–200)
Troponin i, poc: <0.05 ng/mL (ref 0.00–0.09)
Troponin i, poc: <0.05 ng/mL (ref 0.00–0.09)
Troponin i, poc: <0.05 ng/mL (ref 0.00–0.09)

## 2010-06-12 LAB — BASIC METABOLIC PANEL
BUN: 17 mg/dL (ref 6–23)
CO2: 30 mEq/L (ref 19–32)
Calcium: 9.4 mg/dL (ref 8.4–10.5)
Chloride: 101 mEq/L (ref 96–112)
Creatinine, Ser: 1.01 mg/dL (ref 0.4–1.5)
GFR calc Af Amer: >60 mL/min (ref >60)

## 2010-06-12 LAB — AMMONIA: Ammonia: 30 umol/L (ref 11–35)

## 2010-07-20 NOTE — Assessment & Plan Note (Signed)
[facility]                       [place] CARDIOLOGY OFFICE NOTE   NAME:[name], [name]                        MRN:          [id number]  DATE:09/12/2006                            DOB:          08/23/1950    [name] returns today for followup.  It has been somewhat interesting.  The patient lives in [place].  I have been seeing him in [place].  He  subsequently wanted to see me in [place].  I explained to him that  Dr. Antoine Poche goes to [facility] and I will try to arrange his followup  there, since it is most convenient.   Patient is status post mitral valve repair for flail posterior leaflet.  He has done fairly well postoperatively.  He did have some atrial  fibrillation.  He has been on Coumadin.  We have been cutting back his  dosages of amiodarone over the last couple of months.  He has been  maintained in sinus rhythm.  I told him to stop his Coumadin in a month.   He continues to have significant anxiety.  Unfortunately, he lost his  job after his surgery.  He is not working.  He has gained some weight.  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   He has not had any significant palpitations.  He gets occasional flip  flops, but there has been no evidence of recurrent A-fib.   He had his INR checked in [address] today.  I do not know the result.   The patient has been having occasional exertional dyspnea and fatigue.  It sounds functional.  I think he is somewhat depressed about not having  work and there is a lot of stress.  He recently had to put a new septic  system in at his house.   His daughter was with him today, as well, and she has significant  anxiety and sees a psychiatrist.   REVIEW OF SYSTEMS:  Otherwise negative.  In particular, he has not had  any significant bleeding diathesis, TIA or CVA.   MEDICATIONS INCLUDE:  1. Warfarin as directed.  2. Lopressor 12.5 mg b.i.d.  3. Stool softener.  4. An aspirin a day.  5. Claritin as needed.  6. Amiodarone 200 a day, which was stopped about a month ago.   EXAM:  Remarkable for a somewhat anxious, middle-aged, white male, in no  distress.  Affect is appropriate.  Weight is 189, which is up about 5  pounds from February.  Blood pressure is 110/72, pulse is 84 and  regular, he is afebrile, respiratory rate is 14.  HEENT:  Normal.  There is no lymphadenopathy, no thyromegaly, no carotid  bruits.  LUNGS:  Clear with good diaphragmatic motion.  Sternum is well-healed.  There is an S1, S2, without a murmur.  PMI is  normal.  ABDOMEN:  Benign.  Bowel sounds positive.  There is no tenderness, no  AAA, no hepatosplenomegaly, no hepatojugular reflux.  Distal pulses are intact, no edema.  There is no femoral bruit.  NEUROLOGIC:  Nonfocal.  SKIN:  Warm and dry.  There is no muscular weakness.   IMPRESSION:  1. Status post mitral valve replacement.  Followup echo in the next      few weeks.  His valve repair sounds normal with no residual MR;      however, I would like to see what his LV function is.  In regards      to his complaints of dyspnea and fatigue, we will continue his low-      dose beta blocker.  2. Benign palpitations.  There is no evidence of recurrent atrial      fibrillation.  He is beating regular.  We will continue low-dose      beta blocker and aspirin.  He will stop his Coumadin in a month.  3. Anxiety.  I am not sure what anxiolytics he takes.  He needs to      follow up with his primary care doctor about this.  I suspect he      would benefit from an SSRI.  Suspect this would also improve if he      were to get gainful employment again.  4. Seasonal allergies.  Continue Claritin p.r.n.  Avoid Sudafed or      other stimulants in regards to sinus problems.   Again, patient will have his followup 2-D echocardiogram.  So long as  his LV function is good and his repair is not too tight, I will have him  see Dr.  Antoine Poche in about two months in [place].     Noralyn Pick. Eden Emms, MD, [facility]  Electronically Signed    PCN/MedQ  DD: 09/12/2006  DT: 09/13/2006  Job #: [id number]

## 2010-07-20 NOTE — Assessment & Plan Note (Signed)
[facility] HEALTHCARE                            CARDIOLOGY OFFICE NOTE   NAME:Wolfe Wolfe                        MRN:          [id number]  DATE:11/22/2006                            DOB:          [birth year]    PRIMARY CARE PHYSICIAN:  Delaney Meigs, M.D.   REASON FOR PRESENTATION:  Evaluate patient with mitral valve repair.   HISTORY OF PRESENT ILLNESS:  The patient presents for followup with  mitral valve repair. He had this done by Dr.  Cornelius Moras in mid November. He  apparently had mitral valve prolapse and severe mitral regurgitation. He  had some postoperative atrial fibrillation, but this resolved. He was  seeing Dr.  Eden Emms, but lives here in [place] and so is now referred  here. He did have an echocardiogram ordered by Dr.  Eden Emms and done in  [place], this was July. It demonstrated mitral regurgitation  substantial. However, Dr.  Dietrich Pates could not adequately quantify  this. Thought it was moderate but not necessarily severe. He had normal  left ventricular size and function.   The patient denies any symptoms. He never really had any prior to the  surgery. He denies any shortness of breath. He has had no palpitations,  pre-syncope or syncope. He denies any chest discomfort, neck or arm  discomfort. He thinks he has recovered nicely from the surgery.   PAST MEDICAL HISTORY:  1. Mitral valve prolapse and regurgitation, status post repair.  2. Obesity.  3. Postoperative atrial fibrillation.  4. Anxiety.   ALLERGIES:  None.   MEDICATIONS:  1. Aspirin 81 mg daily.  2. Lopressor 12.5 mg daily.   REVIEW OF SYSTEMS:  He did have an episode of orthostasis the other day.   PHYSICAL EXAMINATION:  The patient is in no distress. Blood pressure  112/68, heart rate 90 and regular. Weight 188 pounds. Body mass index  28.  HEENT: Eyelids unremarkable. Pupils equal, round, and reactive to light.  Fundi not visualized. Oral mucosa is unremarkable.  NECK: No jugular venous distention, waveform within normal limits.  Carotid upstroke brisk and symmetrical. No bruits, no thyromegaly.  LYMPHATICS: No cervical, axillary or inguinal adenopathy.  LUNGS: Clear to auscultation bilaterally.  BACK: No costovertebral angle tenderness.  CHEST: Well-healed sternotomy scar.  HEART: PMI not displaced or sustained. S1, S2 within normal limits. No  S3. No S4. No clicks, rub or murmurs.  ABDOMEN: Obese, positive bowel sounds, normal in frequency and pitch. No  bruits. No rebounds. No guarding. No midline pulsatile mass. No  hepatomegaly or splenomegaly.  SKIN: No rashes, no nodules.  EXTREMITIES: 2+ pulses. No edema.  NEURO: Oriented to person, place and time. Cranial nerves II-XII grossly  intact. Motor grossly intact throughout.   EKG: Sinus rhythm, rate 98, axis within normal limits. Intervals within  normal limits. No acute ST-T wave changes.   ASSESSMENT/PLAN:  1. Mitral regurgitation. The patient apparently has some perhaps      moderate mitral regurgitation following valve repair. My plan is to      wait another six months and have an echocardiogram done  down in our      office. It was suggested by Dr.  Dietrich Pates on his reading that we      get adequate Doppler. He is not having symptoms. At this point, he      will continue with medications as listed.  2. Orthostatic hypotension. The patient describes symptoms consistent      with this when he was bending over to fix a golf cart. He has not      had any other symptoms related to this. I cautioned him about      orthostasis and how to avoid it.  3. Obesity. He understands that he would lose weight with diet and      exercise.  4. Followup. Will be as listed above.     Rollene Rotunda, MD, [facility]  Electronically Signed    JH/MedQ  DD: 11/22/2006  DT: 11/22/2006  Job #: [id number]   cc:   Delaney Meigs, M.D.

## 2010-07-20 NOTE — Consult Note (Signed)
NAME:  [name], [name] NO.:  [id number]   MEDICAL RECORD NO.:  [id number]          PATIENT TYPE:  OBV   LOCATION:  1859                         FACILITY:  [facility]   PHYSICIAN:  Luis Abed, MD, FACCDATE OF BIRTH:  September 12, 1950   DATE OF CONSULTATION:  10/30/2008  DATE OF DISCHARGE:  10/30/2008                                 CONSULTATION   PRIMARY CARDIOLOGIST:  Rollene Rotunda, MD, [facility]   PRIMARY CARE Florentine Diekman:  Delaney Meigs, MD   PATIENT PROFILE:  A 60 year old Caucasian male with prior history of  mitral regurgitation status post mitral valve repair in 2007 who  presented to the ED with chest pain.   PROBLEMS:  1. Chest pain.      a.     Normal coronary arteries by cardiac catheterization in       October 2007.      b.     On October 30, 2008, stress echocardiogram, EF 60%,       questionable inferior basal hypokinesis, and significant MR.  2. History of mitral regurgitation.      a.     Status post mitral valve repair in 2007.      b.     Moderate MR postoperatively, stable by echo on march 2010.  3. History of postop AFib.  4. Anxiety.   HISTORY OF PRESENT ILLNESS:  A 60 year old Caucasian male with history  of mitral regurgitation status post mitral valve repair in 2007.  Postop, he has been known to have moderate mitral regurgitation.  His  last echo was in March of this year.  Last evening at approximately 5:00  p.m., the patient was at [employer], sitting and had not eaten.  He had  sudden onset of sharp, 10/10, substernal chest pain with mild  diaphoresis and flushing.  Chest pain resolved very promptly in about 30  seconds without intervention, but diaphoresis and flushing persisted for  about 30 minutes.  He then felt weak for about an hour or so.  He  presented to the [facility] ED and felt completely better after about  one or two hours.  He subsequently ruled out for MI and underwent stress  echo this morning.  He had good exercise  tolerance without recurrence of  chest pain; however, his echocardiographic imaging was difficult to  assess for inferobasal wall motion and thus the study was called  inconclusive.  He has not had any additional chest pain.  We have been  asked to formally evaluate him.   ALLERGIES:  No known drug allergies.   HOME MEDICATIONS:  1. Aspirin 81 mg daily.  2. Toprol-XL 12.5 mg daily.   FAMILY HISTORY:  Mother died at 24 of COPD.  Father died at 37 of MI.  His brother is alive and well.   SOCIAL HISTORY:  He lives in [place] with his wife.  He works as a Musician.  He denies tobacco,  alcohol, or drugs.  He does not routinely exercise.   REVIEW OF SYSTEMS:  Positive for  chest pain as outlined in the HPI.  Otherwise, he has also had diaphoresis and flushing, all of which have  resolved.  Otherwise, all systems are reviewed and negative.  He is a  full code.   PHYSICAL EXAMINATION:  VITAL SIGNS:  Temperature 97.3, heart rate 75,  respirations 18, blood pressure 107/71, and pulse oximetry 97% on room  air.  GENERAL:  A pleasant white male in no acute distress.  Awake, alert, and  oriented x3.  He has a normal affect.  HEENT:  Normal.  NEUROLOGIC:  Grossly intact, nonfocal.  SKIN:  Warm and dry without lesions or masses.  NECK:  Supple without bruits or JVD.  LUNGS:  Respirations are regular and unlabored.  Clear to auscultation.  CARDIAC:  Regular S1 and S2.  He has 3/6 systolic murmur at the apex.  ABDOMEN:  Round, soft, nontender, and nondistended. Bowel sounds present  X4.  EXTREMITIES:  Lower extremities are warm, dry, and pink.  No clubbing,  cyanosis, or edema.  Dorsalis pedis and posterior tibial pulses are 2+  and equal bilaterally.   Chest x-ray shows mild cardiomegaly.  No acute findings.  EKG shows no  acute ST-T changes.  Hemoglobin 15.7, hematocrit 46.1, WBC 7.5, and  platelets 197.  Sodium 137, potassium 3.9, chloride  101, CO2 of 30, BUN  17, creatinine 1.01, and glucose 90.  Cardiac markers negative x3.  Calcium 9.4.   ASSESSMENT AND PLAN:  1. Episode of chest pain, that last about 30 seconds somewhat      atypical.  This was followed by some fatigue and diaphoresis that      lasted about an hour or two, but have now been resolved completely      for about 18 hours.  Stress echo this morning showed good exercise      tolerance and overall has been felt to be low risk (this has been      read by Dr. Myrtis Ser who has also seen the patient).  Plan to discharge      from the ED.  He is to follow up with Dr. Antoine Poche on August 29 at      4:00 p.m.  2. Mitral regurgitation.  This was moderate by last echo March 2010.      It does not appear to be significantly changed but limited imaging      today.  Follow with Dr. Antoine Poche.      Nicolasa Ducking, ANP      Luis Abed, MD, [facility]  Electronically Signed    CB/MEDQ  D:  10/30/2008  T:  10/31/2008  Job:  [id number]

## 2010-07-20 NOTE — Assessment & Plan Note (Signed)
[facility] HEALTHCARE                            CARDIOLOGY OFFICE NOTE   NAME:PECHEJaimie, Redditt                        MRN:          [id number]  DATE:03/15/2007                            DOB:          January 12, 1951    PRIMARY CARE PHYSICIAN:  Delaney Meigs, M.D.   REASON FOR VISIT:  Evaluate patient status post mitral valve repair.   HISTORY OF PRESENT ILLNESS:  The patient presents for follow-up of the  above.  When I last saw him in September, he had no symptoms.  However,  an echocardiogram had suggested that he had substantial mitral  regurgitation following valve surgery.  At that point my plan was  repeat echo which was done today.   He has had no symptoms of shortness of breath, PND, or orthopnea.  He  has had no palpitations, presyncope, or syncope.  He did have a vague  episode two weeks ago.  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  He  felt like I was going to die.  He could not describe any symptoms  associated with this other than one of extreme anxiety.  He actually got  up and started running.  He started to cry.  Actually when he did that,  things got better.  He had a slight recurrence after he stopped and went  back to work.  He called EMS.  Everything checked out.  His blood  pressure, heart rate, and blood sugar were fine.  He has not had any  recurrence of this.  He had never had anything like this before.  He did  not describe any tachypalpitations.  He did not describe any shortness  of breath.  He has not had any PND or orthopnea.  He has not had any  chest pressure.   PAST MEDICAL HISTORY:  Mitral valve prolapse and regurgitation status  post repair, obesity, postoperative atrial fibrillation, anxiety.   ALLERGIES:  No known drug allergies.   CURRENT MEDICATIONS:  1. Aspirin 81 mg daily.  2. Lopressor 12.5 mg daily.  3. Vesicare.   REVIEW OF SYSTEMS:  As stated in the HPI and otherwise negative for  other systems.   PHYSICAL EXAMINATION:  GENERAL:  The patient is in no distress.  VITAL SIGNS:  Blood pressure 115/85, heart rate 87 and regular.  HEENT:  Eye lids unremarkable, pupils equal, round, and reactive to  light, fundi not visualized.  Oral mucosa unremarkable.  NECK:  No jugular venous distention, waveform within normal limits.  Carotid upstroke brisk and symmetric.  No bruits and no thyromegaly.  LYMPHATICS:  No cervical, axillary, or inguinal adenopathy.  LUNGS:  Clear to auscultation bilaterally.  BACK:  No costovertebral angle tenderness.  CHEST:  Unremarkable except for well-healed sternotomy scar.  HEART:  PMI not displaced or sustained.  S1 and S2 within normal limits.  No S3, no S4.  No clicks, rubs, or murmurs.  ABDOMEN:  Flat, positive bowel sounds normal in frequency and pitch.  No  bruits, no rebound, no guarding, no midline pulsatile mass, no  hepatomegaly, and no  splenomegaly.  SKIN:  No rashes, no nodules.  EXTREMITIES:  2+ pulses throughout.  No edema. No cyanosis or clubbing.  NEUROLOGY:  Oriented to person, place, and time.  Cranial nerves II-XII  grossly intact.  Motor grossly intact.   ECHOCARDIOGRAM:  The patient had an echocardiogram today.  The  preliminary demonstrates no significant regurgitation.  He has mild  regurgitation.  We will look at this carefully and try to quantify and  qualify this.  I would not suspect significant regurgitation from  physical examination or symptoms.   ASSESSMENT:  Abnormal episode, for lack of a better name.  I have listed  this as a episode.  This could have been tachypalpitations as he has  had atrial fibrillation in the past.  He had no symptoms consistent with  that.  If he gets this in the future, that is probably the first thing I  would investigate with a monitor.  There is a possibility that this is a  panic attack, though, he does not think so.  He has a history of  anxiety.   FOLLOWUP:  I will see him back in one year or  sooner if he has any  problems.     Rollene Rotunda, MD, [facility]  Electronically Signed    JH/MedQ  DD: 03/15/2007  DT: 03/16/2007  Job #: [id number]   cc:   Delaney Meigs, M.D.

## 2010-07-23 NOTE — Op Note (Signed)
[name], [name]                 ACCOUNT NO.:  [id number]   MEDICAL RECORD NO.:  [id number]          PATIENT TYPE:  INP   LOCATION:  2306                         FACILITY:  [facility]   PHYSICIAN:  Salvatore Decent. Cornelius Moras, M.D. DATE OF BIRTH:  [birth year]   DATE OF PROCEDURE:  01/17/2006  DATE OF DISCHARGE:                                 OPERATIVE REPORT   PREOPERATIVE DIAGNOSIS:  Mitral valve prolapse with severe mitral  regurgitation.   POSTOPERATIVE DIAGNOSIS:  Mitral valve prolapse with severe mitral  regurgitation.   PROCEDURE:  Median sternotomy for mitral valve repair (quadrangular  resection of posterior leaflet with sliding leaflet annuloplasty, posterior  commissuroplasty, artificial Gore-Tex chord x1, closure of posterior leaflet  cleft, 30 mm Edwards physio ring annuloplasty).   SURGEON:  Salvatore Decent. Cornelius Moras, M.D.   ASSISTANT:  Evelene Croon, M.D.   SECOND ASSISTANT:  Constance Holster, [place]   ANESTHESIA:  General.   BRIEF CLINICAL NOTE:  The patient is a 60 year old male with known history  of mitral valve prolapse, who has otherwise been healthy.  Approximately 2  years ago the patient was noted to have a heart murmur on physical exam.  An  echocardiogram confirmed the presence of mitral valve prolapse with mitral  regurgitation.  More recently the patient has developed progressive symptoms  of exertional shortness of breath, malaise, and fatigue.  Follow-up  echocardiogram was notable for the presence of moderate to severe mitral  regurgitation with normal left ventricular function.  Transesophageal  echocardiogram confirmed the presence of bileaflet prolapse of the mitral  valve with severe mitral regurgitation.  The patient ultimately underwent  left and right heart catheterization. And this was notable for the absence  of significant coronary artery disease.  A full consultation note has been  dictated previously.  The patient has been counseled at length regarding the  indications, risks, and potential benefits of surgical intervention for  treatment of his mitral valve regurgitation.  Alternative strategies have  been discussed in detail.  He understands and accepts all associated risks  of surgery including but not limited to risk of death, stroke, myocardial  infarction, bleeding requiring blood transfusion, arrhythmia, heart block  with bradycardia requiring permanent pacemaker, renal failure, respiratory  failure, possibility of late complications related to mitral valve repair  such as late recurrence of regurgitation or stenosis, possible need for  valve replacement if valve repair is not technically feasible at the time of  surgery.  All of his questions have been addressed.   OPERATIVE FINDINGS:  1. 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  2. No residual mitral regurgitation after successful mitral valve repair.  3. Mild left ventricular dysfunction (ejection fraction estimated 50%)   OPERATIVE NOTE IN DETAIL:  The patient was brought to the operating room on  above-mentioned date and central monitoring was established by the  anesthesia service under the care and direction of Dr. Bedelia Person.  Specifically, a Swan-Ganz catheter is placed through the right internal  jugular approach.  A radial  arterial line is placed.  Intravenous  antibiotics are administered.  Following induction with general endotracheal  anesthesia, a Foley catheter is placed.  The patient's chest, abdomen, both  groins, and both lower extremities are prepared and draped in a sterile  manner.   Baseline transesophageal echocardiogram is performed by Dr. Gypsy Balsam.  This  confirms the presence of mitral valve prolapse with moderate to severe  mitral regurgitation.  Findings are identical to those from the patient's  recent transesophageal echocardiogram performed as an outpatient.  Left  ventricular function  appears mildly reduced.  The aortic valve appears  normal.  No other significant abnormalities are noted.  There is bileaflet  prolapse with thickened and redundant leaflet tissues for both anterior and  posterior leaflet with gross characteristics consistent with Barlow's  disease.   A median sternotomy incision is performed.  The pericardium is opened.  The  patient is heparinized systemically.  The ascending aorta is cannulated.  A  venous cannula is placed in the superior vena cava.  A second venous cannula  is placed low in the right atrium with tip extending down the inferior vena  cava.  A retrograde cardioplegia catheter is placed through the right atrium  into the coronary sinus.  Adequate heparinization is verified.  Cardiopulmonary bypass is begun.  Vessel loops are placed around the  superior vena cava and the inferior vena cava.  A cardioplegia catheter is  placed in the ascending aorta.  A temperature probe is placed in the left  ventricular septum.   The patient is allowed to cool passively to 28 degrees' systemic  temperature.  The aortic crossclamp is applied and cold blood cardioplegia  is administered initially in an antegrade fashion through the aortic root.  Iced saline slush is applied for topical hypothermia.  Supplemental  cardioplegia is administered retrograde through the coronary sinus catheter.  The initial cardioplegic arrest and myocardial cooling are felt to be  excellent.  Repeat doses of cardioplegia are administered intermittently  throughout the crossclamp portion of the operation retrograde through the  coronary sinus catheter to maintain the left ventricular septal temperature  below 15 degrees centigrade.   A left atriotomy incision is performed posteriorly through the interatrial  groove.  The mitral valve is exposed using a self-retaining retractor. Exposure is felt to be excellent.  The patient has a large, floppy,  redundant mitral valve with  gross characteristics consistent with Barlow's  disease.  Specifically, there is bileaflet prolapse of the valve with  thickened and redundant leaflet tissue circumferentially.  There is a  particular severe area of prolapse in the posterior leaflet.  The posterior  leaflet is actually comprised of a total four scallops, to of which would  typically be characterized as P2.  The area of the most severe prolapse  involves the P2 portion of the posterior leaflet scallop, which was located  toward the posterior commissure.  There is also severe prolapse of P3.  The  patient has relatively large commissural leaflets in both the anterior and  posterior commissures, and both of these are prolapsed, particularly the one  in the posterior commissure.  The other portion of the posterior leaflet P2  towards the anterior commissure is also prolapsed, and there is mild  prolapse involving A2 as well.  There are no ruptured chords.  There is some  elongation of the subvalvular apparatus.  There is minimal calcification.   A quadrangular resection of the severely prolapsed portion of P2 is  performed.  A sliding leaflet annuloplasty is performed and the majority of  this sliding portion is constructed under the P2 portion of the residual P2  leaflet.  This is mobilized off of the posterior annulus over a distance of  approximately half the posterior circumference.  The P3 portion is mobilized  a short distance but not nearly as much.  The remaining P2 portion is also  shortened due to the excessive height of the P2 portion of the posterior  leaflet.  This is performed by excising a triangular shape of leaflet tissue  at its base.  Interrupted 2-0 Ethibond horizontal mattress sutures are  placed circumferentially around the entire mitral annulus.  These sutures  will ultimately serve as ring annuloplasty sutures and at this portion of  the procedure, they facilitate to expose the annulus more  symmetrically.  A  figure-of-eight compression suture is placed in the posterior annulus in the  vicinity of the quadrangular resection.  The remaining portions of P2 and P3  are reattached to the posterior annulus with a two-layer closure of running  4-0 Ethibond suture.  The intervening vertical defect in the posterior  leaflet is closed with interrupted 4-0 Ethibond everting simple sutures.  Inspection at this juncture is notable for the presence of some residual  prolapse in the P2 portion of the posterior leaflet towards the anterior  commissure as well as prolapse of P3 and the commissural leaflet at the  posterior commissure.  A single CD-4 Gore-Tex artificial chord is  constructed placing the Gore-Tex suture with small Gore-Tex pledgets through  the head of the anterior papillary muscle.  This suture is tied down and  subsequently placed through the free margin of the posterior leaflet in the prolapsed portion of P2 near the P1-P2 junction.  This suture is not tied  down until later after the ring annuloplasty has been completed.  The  prolapse of P3 and the posterior commissure is corrected by performing a  series of interrupted 4-0 Ethibond everting mattress sutures to construct a  commissuroplasty in this region.  A 30-mm annuloplasty ring is chosen as the  size, corresponds precisely to the dimensions of the anterior leaflet of the  mitral valve.  A 30-mm Edwards physio ring (model number 4450, serial number  [id number]) is secured in place uneventfully.  After completion of the ring  annuloplasty, the Gore-Tex suture is tied while filling the left ventricular  chamber with saline to adjust the height of the artificial chordae tendineae  to appropriate level.  Two additional horizontal mattress 4-0 Ethibond  sutures are utilized to further complete the commissuroplasty suture near  the posterior commissure.  In addition, a large cleft between P1 and P2 is  closed using a series of  interrupted 4-0 Ethibond sutures to treat mild  prolapse in this region as well.  After completion of the valve repair, the  valve appears to be perfectly competent upon instilling the left ventricular  chamber with iced saline solution.  Blue ink is utilized to mark the atrial  surface of the mitral valve at the level of coaptation and upon re-  examination, there appears to be a large broad area of surface of coaptation  below the plane of the leaflet.  Rewarming is begun.   The left atrial appendage is oversewn from within the left atrium using a  two-layer closure of running 3-0 Prolene suture.  The left atriotomy  incision is now closed using a two-layer closure of running  3-0 Prolene  suture.  The patient is placed in Trendelenburg position.  One final dose of  warm retrograde hot-shot cardioplegia is administered.  The lungs are  ventilated and the heart allowed to fill to evacuate any residual air  through the aortic root.  The aortic crossclamp is removed after a total  crossclamp time of 130 minutes.   The heart begins to beat spontaneously without need for cardioversion.  The  retrograde cardioplegia catheter is removed.  Epicardial pacing wires are  fixed to the right ventricular outflow tract and to the right atrial  appendage.  The patient is rewarmed to 37 degrees centigrade temperature.  The IVC cannula is removed and its cannulation site oversewn with Prolene  suture.  The patient is rewarmed to 37 degrees centigrade temperature.  The  patient is weaned from cardiopulmonary bypass.  Initially upon separation  from bypass, the patient's blood pressure is notably low and partial bypass  is continued for an additional 5 minutes until vasoconstrictive agents can  be instilled.  The patient is subsequently weaned from cardiopulmonary  bypass without difficulty.  The patient's rhythm at separation from bypass is a slow junctional rhythm.  AV sequential pacing is employed.   Total  cardiopulmonary bypass time for the operation is 165 minutes.  The patient  is weaned from bypass on low-dose dopamine at 5 mcg/kg per minute.   Follow-up transesophageal echocardiogram performed by Dr. Gypsy Balsam after  separation from bypass demonstrates preserved left ventricular function with  a well-seated mitral valve ring.  The mitral valve appears to be functioning  normally with an excellent broad area surface of coaptation of the anterior  and posterior leaflet below the plane of the mitral valve annulus.  There is  trivial residual mitral regurgitation.  No other abnormalities are noted.  There is no significant residual air.   The venous and arterial cannulae are removed uneventfully.  Protamine is  administered to reverse the anticoagulation.  The mediastinum is irrigated  with saline solution containing vancomycin.  Meticulous surgical hemostasis  is ascertained.  The mediastinum and both left and right pleural space are  drained using four chest tubes exited through separate stab incisions  inferiorly.  The pericardium is reapproximated loosely anterior to the aorta  and the right ventricular outflow tract.  The sternum is closed with double-  strength sternal wire.  The On-Q continuous pain management system is  utilized to facilitate postoperative pain control.  Two 10-inch Silastic  catheters supplied with the On-Q kit are tunneled into the deep subcutaneous  tissues and positioned just lateral to the lateral border of the sternum on  either side.  Each catheter is flushed with 5 mL of 0.5% bupivacaine  solution, and ultimately they are connected to a continuous infusion pump.  The soft tissues anterior to the sternum are closed in multiple layers and  the skin is closed with a running subcuticular skin closure.   The patient tolerated the procedure well and is transported to the surgical  intensive care unit in stable condition.  __________ intraoperative   complications.  All sponge, instrument and needle counts are verified  correct at completion of the operation.  No blood products were  administered.      Salvatore Decent. Cornelius Moras, M.D.  Electronically Signed     CHO/MEDQ  D:  01/17/2006  T:  01/17/2006  Job:  [id number]   cc:   Noralyn Pick. Eden Emms, MD, [facility]  Delaney Meigs, M.D.  [facility] Cardiology  Office

## 2010-07-23 NOTE — Discharge Summary (Signed)
[name], [name]                 ACCOUNT NO.:  [id number]   MEDICAL RECORD NO.:  [id number]          PATIENT TYPE:  INP   LOCATION:  2012                         FACILITY:  [facility]   PHYSICIAN:  [name], M.D. DATE OF BIRTH:  [birth year]   DATE OF ADMISSION:  01/17/2006  DATE OF DISCHARGE:  01/22/2006                               DISCHARGE SUMMARY   ADMISSION DIAGNOSIS:  Mitral valve prolapse with severe mitral  regurgitation.   DISCHARGE DIAGNOSES:  1. status post mitral valve repair.  2. Anxiety.   CONSULTATIONS:  None.   PROCEDURE:  On January 17, 2006, the patient underwent median  sternotomy for mitral valve repair (quadrangular resection of posterior  leaflet with sliding leaflet annuloplasty, posterior commissuroplasty,  artifical Gore-Tex chord  x1, closure posterior leaflet cleft, 30-mm  Edwards physio ring annuloplasty (by Dr. Tressie Stalker).   HISTORY AND PHYSICAL:  This is 60 year old male with known history of  mitral valve prolapse but has otherwise been healthy.  Two years ago,  the patient was noted to have heart murmur on physical exam.  An  echocardiogram confirmed the presence of mitral valve prolapse and  mitral regurgitation.  More recently the patient developed progressive  symptoms of shortness of breath, lightheadedness, fatigue.  Followup  echo was normal for presence of moderate to severe mitral regurgitation  with normal LV function.  Patient underwent a right and left heart cath  and this was absent for significant coronary artery disease.  Please see  dictated history and physical for further details.   HOSPITAL COURSE:  Patient underwent mitral valve repair, by Dr. Tressie Stalker on January 17, 2006, without any complications.  Postoperatively  the patient progressed quite well and as expected.  Hospital stay was  rather uneventful.  The patient was off the vent on January 18, 2006  with O2 sats of 98% on 2 liters.  The patients oxygen  was eventually  weaned appropriately and he was breathing 92% on room air.   Patient did have some acute blood loss anemia; however, he did not  require any blood transfusions and his hemoglobin and hematocrit have  remained stable.  Patient had some post op volume overload and he was  diuresed with Lasix and he did respond appropriately.  The patient did  have post op hypotension and he was on dopamine and Neo drips until the  evening of January 19, 2006.  Patient was then transferred out of the  SICU on January 20, 2006.  Patient's blood pressure was slightly  improved on January 20, 2006 at 113/69.  The patient was on a very low  dose Beta blocker, Lopressor 12.5 mg daily.  Patient was quite anxious  postoperatively and has received Xanax p.r.n. for this, which did help.  Patient has post op thrombocytopenia; however, this did improve daily.  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  Patient was started on  Coumadin on January 19, 2006 for short term treatment of his mitral  valve  repair.   Patient's incisions have been clear, dry, and intact and healing well.  He is doing his pulmonary toilet as instructed.  The patient is  ambulating with cardiac rehab.  He is ambulating 500 feet with a rolling  walker and assist x2 with a steady gait.  The patient did have a bowel  movement on post op day 4.   DISCHARGE DISPOSITION:  Patient will be discharged home in good health.   MEDICATIONS:  1. Aspirin 81 mg p.o. daily.  2. Lopressor 12.5 mg p.o. daily.  3. Tylenol 1 to 2 tabs every 4 hours p.r.n.  4. Coumadin 2.5 mg p.o. daily.  5. Lasix 40 mg p.o. daily x5 days.  6. Potassium chloride 20 mEq p.o. daily x5 days.  7. Amiodarone 200 mg b.i.d.   INSTRUCTIONS:  Patient is instructed to follow a low-fat, low-salt diet.  No heavy lifting or driving for 3 weeks.  Patient is to ambulate 3 to 5  times daily and  increase until is tolerated.  Continue his breathing  exercises.  He may shower and clean his incision with mild soap and  water.  Patient is to call the office if any more problems shall arise,  such an incision erythema, drainage, temperature greater than 101.5.   FOLLOWUP:  Patient will have a followup appointment with Dr. Cornelius Wolfe on  February 13, 2006 at 12:15 p.m.  He is to follow up with [facility] at January 24, 2006.  Patient will follow up with Dr.  Eden Emms in 2 weeks after discharge, where he will have a chest x-ray  taken.      Constance Holster, PA      [name], M.D.  Electronically Signed    JMW/MEDQ  D:  02/03/2006  T:  02/04/2006  Job:  [id number]   cc:   [name], M.D.

## 2010-07-23 NOTE — Assessment & Plan Note (Signed)
[facility]                              CARDIOLOGY OFFICE NOTE   NAME:PECHEDerreon, Consalvo                          MRN:          [id number]  DATE:12/26/2005                            DOB:          08/15/1950    [name] returns here to follow up his TEE.  He did show significant  prolapse of the posterior leaflet which should be repairable.   He has seen the dentist and had 7 days of antibiotics, but I am not sure  what the ultimate plan is for his abscessed tooth.  He has a followup  appointment with Dr. Cornelius Moras and I explained to him that he needed right and  left heart catheterization prior to surgery.  We will try to schedule this  for this Friday.  He knows that no definitive surgery can be made yet until  his teeth get taken care of.   He did not have significant coronary artery disease on this catheterization.  He had a significant V-wave but no pulmonary hypertension.   EXAM:  On exam, he is anxious.  The  blood pressure is 120/70, pulse is 70  and regular, lungs were clear, carotids are normal, skin is warm and dry.  There is no thyromegaly, there is no adenopathy.  He has a significant MR  murmur.  PMI is palpable.  Abdomen is benign.  His pulses are intact with no  edema.   MEDICATIONS:  His only medications currently include Protonix.   IMPRESSION:  Severe MR with unsupported segment to the middle scallop of the  posterior leaflet.  Valve appears repairable.  I think particularly given  its repairable nature he should proceed with surgery.  He has had  significant increase in his dyspnea.  He has  other constitutional symptoms  which I do not think are related to his heart.  He will continue to follow  up with the dentist tomorrow, and once his abscessed tooth is fixed he can  proceed with the surgery.  He will see Dr. Cornelius Moras in the near future and I  will proceed with his right and left heart cath.    ______________________________  Noralyn Pick. Eden Emms, MD, [facility]    PCN/MedQ  DD: 12/26/2005  DT: 12/27/2005  Job #: [id number]   cc:   Salvatore Decent. Cornelius Moras, M.D.

## 2010-07-23 NOTE — Procedures (Signed)
NAMEWILDER, KUROWSKI                 ACCOUNT NO.:  [id number]   MEDICAL RECORD NO.:  [id number]          PATIENT TYPE:  OUT   LOCATION:  RAD                           FACILITY:  [facility]   PHYSICIAN:  Gerrit Friends. Dietrich Pates, MD, FACCDATE OF BIRTH:  03-26-1950   DATE OF PROCEDURE:  DATE OF DISCHARGE:  09/15/2006                                ECHOCARDIOGRAM   CLINICAL DATA:  A 60 year old gentleman with previous mitral valve  repair surgery.   M-mode aorta 3.9, left atrium 3.3, septum 1.3, posterior wall 1.3, LV  diastole 4.4, LV systole 3.6.   1. Technically adequate echocardiographic study.  2. Normal left and right atrial size.  3. Right ventricular size at the upper limit of normal; normal wall      thickness; normal RV systolic function.  4. Normal and trileaflet aortic valve.  5. Normal diameter of the proximal ascending aorta; moderate      calcification of the wall.  6. Mitral valve annuloplasty ring identified.  There is a delicate and      mobile anterior leaflet with a fixed posterior leaflet.  The      Doppler study is technically suboptimal.  A valve area of 1.4 is      suggested, but this does not appear to be an accurate estimate.      Valve area may be somewhat higher.  The mitral regurgitation is not      adequately defined, but appears moderate.  7. The tricuspid valve is normal; trivial regurgitation is present.  8. The pulmonic valve and proximal pulmonary artery are normal.  9. Normal left ventricular size; very mild hypertrophy; normal      regional and global function.   IMPRESSION:  Substantial mitral regurgitation following valve repair  surgery.  Some component of mitral stenosis as well, but this does not  appear to be severe.   A repeat study with adequate attention to Doppler examination of the  mitral valve is recommended.      Gerrit Friends. Dietrich Pates, MD, [facility]  Electronically Signed     RMR/MEDQ  D:  09/17/2006  T:  09/18/2006  Job:  [id number]

## 2010-07-23 NOTE — H&P (Signed)
[name], Wolfe                 ACCOUNT NO.:  [id number]   MEDICAL RECORD NO.:  [id number]          PATIENT TYPE:  OIB   LOCATION:  1961                         FACILITY:  [facility]   PHYSICIAN:  Salvatore Decent. Cornelius Moras, M.D. DATE OF BIRTH:  [birth year]   DATE OF ADMISSION:  12/30/2005  DATE OF DISCHARGE:  12/30/2005                                HISTORY & PHYSICAL   DATE OF PLANNED HOSPITAL ADMISSION:  January 17, 2006.   CHIEF COMPLAINT:  Exertional shortness of breath.   HISTORY OF PRESENT ILLNESS:  [name] is a 60 year old white male with  known history of mitral valve prolapse, who has otherwise been healthy most  of his life.  [name] reports that he first found that he had mitral valve  prolapse approximately 2 years ago, when he was evaluated by Dr. Andee Lineman.  At  that time, there apparently was only a mild amount of mitral regurgitation.  More recently, he was evaluated by [name], a physician assistant, who  works at Dr. [name] office in [place].  He was noted to have a murmur on  physical exam, and he complained of progressive symptoms of shortness of  breath, malaise and fatigue.  He was referred to Dr. Eden Emms and underwent a  transthoracic echocardiogram on September 25 that was notable for the  presence of moderate to severe mitral regurgitation with normal left  ventricular function.  After discussing options with Dr. Eden Emms, [name]  subsequently underwent transesophageal echocardiogram on October 8th, which  confirmed the presence of mitral valve prolapse with severe mitral  regurgitation.  [name] was undergoing some dental work at that time, due  to a dental abscess, and once this was cleared up, he was brought in for  elective left and right heart catheterization which was performed on October  26th.  This confirmed the presence of severe mitral regurgitation with  normal left ventricular function and normal right-sided pressures.  There  was no significant  coronary artery disease.  [name] has now been referred  for possible elective mitral valve repair.   REVIEW OF SYSTEMS:  GENERAL:  The patient reports normal appetite.  Has not  been gaining or losing weight recently.  He is 5 feet 7 inches tall and  weighs 170 pounds.  CARDIAC:  The patient describes a progressive history of  exertional shortness of breath, which probably began close to a year ago and  seemed to progress more significantly after April of this year.  The patient  denies any history of resting shortness of breath.  The patient reports no  PND, orthopnea, nor lower extremity edema.  He does have occasional tachy  palpitations.  He has no documented history of irregular heart rhythms, and  he denies any syncopal episodes.  RESPIRATORY:  Notable for a persistent dry  cough that has lasted over the last 2 months.  The patient denies productive  cough, hemoptysis, wheezing.  GASTROINTESTINAL:  Negative.  The patient has  had symptoms of reflux in the distant past but none for quite some time.  The patient  reports no difficulty swallowing.  He reports normal bowel  function, and he specifically denies hematochezia, hematemesis or melena.  GENITOURINARY:  Negative.  The patient has been treated for benign prostatic  hypertrophy in the past, but he has not had any difficulty urinating  recently.  PERIPHERAL VASCULAR:  Negative.  The patient does report aching  pains in both lower legs that occurs typically at night when he is lying in  bed.  He denies any exertional pain in his legs suggestive of claudication.  NEUROLOGIC:  Notable for fairly frequent occasional headaches.  These have  been evaluated in the past, and the patient was seen by Dr. Darrick Penna at one  time, due to question of a intracranial arterial aneurysm.  MUSCULOSKELETAL:  Notable for mild arthritis afflicting both hands.  HEENT:  Notable for  chronic and recurrent ear infections in the left ear, as well as  decreased  hearing that stems back to a ruptured eardrum and failed surgery that was  performed almost 30 years ago.  Of note, the patient recently had an ear  infection develop, and he has just now started oral antibiotics under the  care of [name] for this infection.  HEMATOLOGICAL:  Negative.   PAST MEDICAL HISTORY:  1. Mitral valve prolapse with mitral regurgitation.  2. Recurrent left ear infections, acute at present.  3. Recent dental abscess, resolved.  4. GE reflux disease, quiescent.  5. Benign prostatic hypertrophy, quiescent.   FAMILY HISTORY:  Noncontributory.   SOCIAL HISTORY:  The patient is married and lives with his wife and two of  their children and one grandchild in [place].  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  This requires fairly strenuous  physical activity.  He is a nonsmoker, and he denies significant alcohol  consumption.   CURRENT MEDICATIONS:  Augmentin 1 tablet 3 times daily, which commenced 3  days ago.   DRUG ALLERGIES:  None known.   PHYSICAL EXAM:  GENERAL:  The patient is a well-appearing white male who  appears his stated age, in no acute distress.  VITAL SIGNS:  Blood pressure is 154/80, pulse 90 regular, oxygen saturation  94% on room air.  HEENT:  Exam is grossly unrevealing.  There is no tenderness in the vicinity  of the previous dental abscess, and the patient has seen his dentist  recently who has cleared him for surgery.  NECK:  Supple.  There is no cervical or supraclavicular lymphadenopathy.  There is no jugular venous distension.  No carotid bruits noted.  Auscultation of the chest reveals clear and symmetrical breath sounds  bilaterally.  No wheezes or rhonchi are noted.  CARDIOVASCULAR:  Exam is  notable for regular rate and rhythm.  There is a very prominent grade 3/6  systolic murmur heard best at the apex but with radiation all across the precordium into the axilla.  There is a prominent S3  gallop as well.  ABDOMEN:  Soft, nontender.  There are no palpable masses.  There is rectus  diastasis in the midline, but there is no true hernia present.  Bowel sounds  are present.  EXTREMITIES:  Warm and well-perfused.  There is no lower extremity edema.  Pulses are palpable at the level of the ankle.  There is no sign of  significance venous insufficiency.  Rectal and GU exams are both deferred.  NEUROLOGIC:  Examination is grossly nonfocal.   DIAGNOSTIC TESTS:  Transthoracic and transesophageal echocardiogram  performed September 25 and October 8 respectively, have both been directly  reviewed.  Mr. Dawson has bi-leaflet prolapse of the mitral valve with severe  mitral regurgitation.  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  There is particularly severe prolapse of the posterior leaflet  near of the vicinity of the junction between the P2 and P3 portion of the  posterior leaflet,t somewhat eccentrically located towards the posterior  commissure.  From this region, the most significant jet of mitral  regurgitation emanates and courses over the atrial surface of the anterior  leaflet of the mitral valve and around the left atrium.  There are no  obvious ruptured cords.  There is no thrombus in the left atrial appendage.  Left ventricular function appears normal.  No other significant  abnormalities are noted.  Cardiac catheterization performed.  October 26 is  also reviewed and notable for the presence of normal coronary artery anatomy  with no significant coronary artery disease.  Right heart pressures were  normal.  Cardiac output is not reported.   IMPRESSION:  Mitral valve prolapse with severe mitral regurgitation and  worsening symptoms of exertional shortness of breath.  I believe that Mr.  Russom would best  be treated by mitral valve repair.  He has severe bileaflet  prolapse of the mitral valve, but I do feel that his valves should be  repairable.  He has had recent exacerbation of recurrent ear infection on  the left side for which he is currently on antibiotics.  He has also had a  dental abscess recently, but this has apparently resolved.   PLAN:  I have discussed options at length with Mr. Rabideau and his wife here,  in the office today.  Alternative treatment strategies have been discussed.  In particular, the risks of continued medical therapy versus proceeding with  surgery at this juncture have been reviewed at length.  A variety of  surgical alternatives have also been reviewed, including minimally invasive  technique versus use of a full sternotomy.  They understand and accept all  associated risks of surgery, including but not limited to risk of death,  stroke, myocardial infarction, respiratory failure, renal failure, pneumonia, bleeding requiring blood transfusion, arrhythmia, heart block  with bradycardia requiring permanent pacemaker, recurrence of mitral  regurgitation or other complications thereof, or the possibility of need for  valve replacement at the time of surgery, if valve repair is not technically  feasible or satisfactory.  If this were the case, we would probably use a  mechanical prosthesis, due to Mr. Martel's relatively young age and lack of  significant other comorbid medical conditions.  This would unfortunately be  associated with the need for long-term anticoagulation with Coumadin, and  under the circumstances, obviously we feel that in all likelihood valve  repair will be feasible.  He understands and accepts all associated risks of  surgery and desires to proceed as described.  I have asked Mr. Sandoval to  schedule an appointment to see [name], after he has completed his course  of oral antibiotics to make sure that his ear infection is cleared up prior   to surgery.  Once he has completed his course of antibiotics, we will begin  amiodarone 400 mg p.o. twice daily in an effort to decrease his risk of  perioperative arrhythmias.  Salvatore Decent. Cornelius Moras, M.D.  Electronically Signed     CHO/MEDQ  D:  01/02/2006  T:  01/03/2006  Job:  [id number]   cc:   Noralyn Pick. Eden Emms, MD, [facility] [place]  Delaney Meigs, M.D.  [facility], [place]

## 2010-07-23 NOTE — Op Note (Signed)
NAME:  [name], [name]                 ACCOUNT NO.:  [id number]   MEDICAL RECORD NO.:  [id number]          PATIENT TYPE:  AMB   LOCATION:  ENDO                         FACILITY:  [facility]   PHYSICIAN:  Peter C. Eden Emms, MD, FACCDATE OF BIRTH:  1950-08-18   DATE OF PROCEDURE:  12/12/2005  DATE OF DISCHARGE:                                 OPERATIVE REPORT   INDICATIONS:  A 60 year old patient with increasing shortness of breath,  severe MR, pre-open heart surgery for assessment of mitral valve  architecture.   The patient was sedated with 7 mg of Versed and 75 mcg of fentanyl.  Using  digital technique, an Omniplane probe was advanced in the distal esophagus  without incident.   Left ventricular cavity size was normal.  EF was 60%.   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  There was bileaflet prolapse.  The  posterior leaflet was worse.  There appeared to be severe prolapse of the  middle segment of the posterior leaflet although it was not frankly flail it  was clearly unsupported.  There was severe anteriorly directed mitral  insufficiency.   The left atrium was mild to moderately dilated.  The right-sided cardiac  chambers were normal.  The aortic valve was trileaflet and normal.  There  was no PFO.  There was no atrial appendage thrombus.  Imaging in the aorta  showed no significant debris.   IMPRESSION:  1. Severe mitral regurgitation with bileaflet prolapse, posterior leaflet      worse than anterior leaflet, unsupported middle scallop segment.  The      valve should be reparable and, given the patient's symptoms of      shortness of breath, he will be referred to CVTS for mitral valve      repair.  2. Normal aortic valve, tricuspid valve, and pulmonary valve.  3. Ejection fraction 60%.  4. Normal right-sided cardiac chambers.  5. No left atrial appendage clot.  6. No atrial septal defect.   The patient tolerated the procedure well.  I will make arrangements for him  to have a  right and left heart catheterization and follow up with myself and  CVTS.           ______________________________  Noralyn Pick. Eden Emms, MD, [facility]     PCN/MEDQ  D:  12/12/2005  T:  12/12/2005  Job:  [id number]

## 2010-07-23 NOTE — Cardiovascular Report (Signed)
[name], [name]                 ACCOUNT NO.:  [id number]   MEDICAL RECORD NO.:  [id number]          PATIENT TYPE:  OIB   LOCATION:  1961                         FACILITY:  [facility]   PHYSICIAN:  Peter C. Eden Emms, MD, FACCDATE OF BIRTH:  10-26-1950   DATE OF PROCEDURE:  12/30/2005  DATE OF DISCHARGE:  12/30/2005                              CARDIAC CATHETERIZATION   INDICATIONS:  60 year old patient with severe mitral valve prolapse,  unsupported middle scallop to the posterior leaflet, severe MR.  Catheterization was done pre mitral valve repair surgery.   Cine catheterization was done with 4-French arterial sheath in the right  femoral artery and a 7-French venous sheath in right femoral vein.   Left main coronary was normal.   Left anterior descending artery was normal.   First and second diagonal branch was normal.   The circumflex coronary artery was a large vessel.  There were two large  obtuse marginal branches that were normal.   Right coronary artery was dominant and normal.   Right heart cath was done due to the patient's significant shortness of  breath and MR.  Mean right atrial pressure was 5, RV pressure was 24/5, PA  pressure was 21/70, mean pulmonary capillary wedge pressure was 12, LV  pressure was 120/80, aortic pressure is 120/68.   RAO ventriculography showed mild left ventricular cavity dilatation.  There  was normal LV function, ejection fraction 55%.  There was angiographic grade  3-4 out of 4 MR.   IMPRESSION:  Mr. Tapp has symptoms of fatigue and shortness of breath.  He  has severe MR with the mitral valve that should be repairable. He does not  have concomitant coronary disease or pulmonary hypertension.  He will be  referred to Dr. Cornelius Moras for mitral valve repair.           ______________________________  Noralyn Pick. Eden Emms, MD, [facility]     PCN/MEDQ  D:  12/30/2005  T:  12/31/2005  Job:  [id number]   cc:   Salvatore Decent. Cornelius Moras, M.D.

## 2010-07-23 NOTE — Assessment & Plan Note (Signed)
[facility]                            CARDIOLOGY OFFICE NOTE   NAME:PECHETerrian, Sentell                          MRN:          [id number]  DATE:03/03/2006                            DOB:          28-Oct-1950    PRIMARY CARDIOLOGIST:  Dr. Charlton Haws.   PATIENT PROFILE:  A 60 year old white male who is recently status post  mitral valve repair by Dr. Cornelius Moras, who presents to clinic for followup of  a recently developed cough.   PROBLEM LIST:  1. Flail mitral valve leaflet.      a.     Status post mitral valve repair, January 17, 2006, by Dr.       Tressie Stalker.  2. Paroxysmal atrial fibrillation.  3. Cough postoperatively.  4. Postoperative minimal right-sided pleural effusion.  5. History of hemorrhoid surgery in 1999.  6. History of ear surgery x2 in the 1980s.  7. Normal coronary arteries by catheterization December 30, 2005.   HISTORY OF PRESENT ILLNESS:  A 60 year old white male with the above  problem list.  He was last seen in the clinic February 23, 2006.  At  that time, he was noted to be in atrial fibrillation but, otherwise,  doing well.  Unfortunately, on December 22, he noted the beginnings of  flu-like symptoms with a cough and called in to the on-call Tamsin Nader and  was advised to continue taking Tessalon Perles and a followup  appointment was arranged.  On December 25, he had worsening of cough  which was productive of clear sputum.  He denies any fevers but did have  chills and aches which he would described as flu-like symptoms.  It hurt  his left chest to cough hard.  Since then, the aches and chills have  resolved and his cough has improved markedly.  It is no longer  productive and it is much less frequent and severe.  He does  occasionally get chest wall discomfort with cough but, as the cough is  improving, this is occurring less frequently.  He denies any PND or  orthopnea, dizziness, syncope, edema, exertional chest pain, or  dyspnea  on exertion.   CURRENT MEDICATIONS:  1. Coumadin as directed.  2. Metoprolol 25 mg 1/2 tablet daily.  3. A stool softener.  4. Aspirin 81 mg daily.  5. Protonix 40 mg daily.  6. Claritin daily.  7. Amiodarone 200 mg b.i.d.  8. Tessalon Perles p.r.n.  9. Drinks lemon juice, which he thinks helps with the cough.   PHYSICAL EXAM:  Blood pressure 116/80.  Heart rate 88.  Respirations 18.  His weight is 180 pounds, up 1 pound since his last visit.  A pleasant, white male in no acute distress, awake, alert, and oriented  x3.  NECK:  No bruits or JVD.  LUNGS:  Respirations are regular and unlabored, clear to auscultation.  CARDIAC:  Regular S1, S2.  No S3, S4, or murmurs.  ABDOMEN:  Round, soft, nontender, nondistended.  Bowel sounds present  x4.  EXTREMITIES:  Warm, dry, pink.  No clubbing, cyanosis, or edema.  Dorsalis pedis, posterior tibial pulses 2+ and equal bilaterally.   ACCESSORY CLINICAL FINDINGS:  Chest x-ray is pending.  EKG showing sinus  rhythm, first-degree AV block, rate of 88 beats per minute.   ASSESSMENT AND PLAN:  1. Cough.  This has been somewhat ongoing since his surgery.  He says      that he was able to control it postoperatively with lemon juice and      Tessalon but then, over this past weekend and holiday, he developed      a recurrence of cough which became productive and then associated      with myalgias and chills which have now resolved.  I suspect he      could have had a touch of the flu and advised continued hydration      and symptomatic management of cough with Tessalon Perles.  We will      obtain a chest x-ray today.  Notably on exam whereas he was      described to have diminished breath sounds by Dr. Eden Emms on      December 20 in the left base, he has clear breath sounds      bilaterally with good air movement today.  2. Mitral regurgitation/flair mitral valve leaflet, status post      repair.  The patient has a well-healed surgical  scar and denies any      dyspnea, which was his presenting symptom prior to surgery.  He      follows up with Dr. Cornelius Moras.  3. Atrial fibrillation.  The patient remains in sinus rhythm today.      The patient remains on amiodarone 200 mg b.i.d. as well as Coumadin      as directed.  When taken off of amiodarone in the past, he had      recurrent paroxysmal atrial fibrillation.  4. Gastroesophageal reflux disease, stable, continue proton-pump      inhibitor.  5. Disposition.  He will follow up with Dr. Eden Emms in 1-2 months or      sooner if necessary.  We will call the patient with results of the      chest x-ray.      Nicolasa Ducking, ANP  Electronically Signed      Everardo Beals. Juanda Chance, MD, [facility]  Electronically Signed   CB/MedQ  DD: 03/03/2006  DT: 03/03/2006  Job #: [id number]

## 2010-07-23 NOTE — Assessment & Plan Note (Signed)
[facility]                            CARDIOLOGY OFFICE NOTE   NAME:Wolfe, [name] HOGSTON                        MRN:          [id number]  DATE:04/10/2006                            DOB:          16-Feb-1951    [name] returns today for a followup.  He has had a bit of a long  recovery from his mitral valve repair.   I think a lot of his problems have stemmed from PAF but also from  anxiety.  Unfortunately, he lost his job.  He used to deliver fuel for a  company for 18 years.  He thinks it is a combination of his health and  his age but he will have to collect unemployment after his disability  runs out.  From a cardiac perspective, he is much improved.  His  postoperative effusions are gone.  He has had some PAF and he has been  maintaining sinus rhythm.  We will start to wean back his amiodarone.   He is not having any significant palpitations.  He did have a  presyncopal spell the other day.  It sounded vagal.  He did not actually  pass out.  It was not associated with shortness of breath, PND, or  orthopnea.   Currently, his medications include:  1. Warfarin as directed.  2. Lopressor 12.5 mg daily.  3. Aspirin daily.  4. Protonix 40 mg daily.  5. Amiodarone 200 b.i.d.   EXAM:  He is in sinus rhythm, rate of 75.  Blood pressure is 130/70.  HEENT:  Normal. Carotids are normal without bruits.  LUNGS:  Clear.  There is a S1, S2.  Normal heart sounds.  ABDOMEN:  Benign.  LOWER EXTREMITIES:  Intact pulses, no edema.   EKG is entirely normal.   IMPRESSION:  Stable flail mitral leaflet status post mitral valve repair  with paroxysmal atrial fibrillation.  He will continue his current  medications and we will wean his amiodarone back to 200 once daily.  I  will try to stop this in a month.   We will reassess the need for Coumadin in 6 months.   The patient, again, has a lot of anxiety and I told him he should  probably try to seek retraining at  [employer] or some other community college.   His heart is improving and I do not think he needs to be on disability  in regards to this.   I had a long discussion with [name] regarding some of his anxiety issues.   He will have a followup echo in 6 months to reassess his LV function  since his mitral valve has been repaired.   And, again, he knows to stop his amiodarone within a month.     Noralyn Pick. Eden Emms, MD, [facility]  Electronically Signed    PCN/MedQ  DD: 04/10/2006  DT: 04/10/2006  Job #: [id number]

## 2010-07-23 NOTE — Assessment & Plan Note (Signed)
[facility]                              CARDIOLOGY OFFICE NOTE   NAME:[name], [name]                        MRN:          [id number]  DATE:12/08/2005                            DOB:          November 27, 1950    [name] is seen today as a new patient.   He is 60years old.  I read a 2D echocardiogram on him on September 25th.  He  had what appeared to be a flail leaflet of his mitral valve.  We arranged  for him to see me in followup.  The patient apparently has seen [name],  P.A., at Dr. Melene Muller office for a physical required by his job.  He  works for an [employer] in [place].  At the time of his physical, he was  noted to have a murmur.   The patient says he has felt awful since April.  He had the acute onset of  some shortness of breath, malaise, and fatigue.  He has not felt well since.  He apparently has seen Dr. Andee Lineman in [employer] two years ago.  At that time, he  said there was a question of some prolapse.  He had a heart cath which  apparently was normal.  He had not had followup since.   On review of systems, he has not had fever.  There has been no evidence of  SBE.  He is functional class I but is having increasing exertional edema.  No palpitations.  No lower extremity swelling.   In talking to the patient, he really does not get seen by medical doctors  frequently.  He is supposed to be a patient of Dr. Joyce Copa, but has not  seen him in about two years.  He denies taking any medicines on a routine  basis.   Review of systems otherwise remarkable for an abscessed tooth in the right  side that needs to be fixed.  He appears to have put this off as well, due  to the cost.   He has no known allergies.   He has not had any major surgery in the past.  He is happily married.  I  have actually seen his wife before for an abnormal EKG.  He works at [employer] and is otherwise fairly sedentary.  He is a nonsmoker.   PHYSICAL  EXAMINATION:  VITAL SIGNS:  Blood pressure 130/70, pulse 64 and  regular.  LUNGS:  Clear.  NECK:  Carotids normal.  HEART:  There is an S1 and S2 with a loud MR murmur with a mid systolic  click.  ABDOMEN:  Benign.  EXTREMITIES:  Lower extremities have intact pulses.  No edema.  LYMPH:  No thyromegaly.  No lymphadenopathy.  HEENT:  He does have an abscessed tooth on the right side.   EKG is normal.  Echo was reviewed.   IMPRESSION:  I had a long discussion with [name] and his wife.  I suspect  that he has baseline mitral valve prolapse and ruptured a cord or leaflet;  however, I will  have to review the previous cath and echoes that were  apparently done two years ago.  With the acute onset of symptoms in April,  it is very likely that he had acute change in the mitral valve apparatus.   His MR was quite significant on our echo, and it appeared that he had a  flail segment.  The natural history of a flail segment with symptoms would  indicate that he should probably have surgery.  I also talked to him at  length about his dentition and his abscess.  This needs to get fixed.  I  gave him a prescription for amoxicillin to take an hour before any dental  surgery, and he understands the importance of fixing this, both in terms of  not having SBE and also having his valve fixed.   We will initially start his workup with the TEE to further define the mitral  valve apparatus and to see which segment may be flail.  Subsequently, if  there is a high likelihood of repair, he will probably be referred to right  and left heart cath.  The patient has had family members who have been  operated on by Dr. Cornelius Moras, and I think he is an excellent mitral valve repair  surgeon, and he would likely be the person we would send him to.  I am a  little bit concerned about his general overall malaise since April.  We will  check his routine lab work for a heart cath as well as two blood cultures to  rule  out indolent SBE.   Again, I spent quite a long time talking to the wife and the patient  regarding the diagnosis, the treatment options, the diagnostic options, and  complications.   Since the patient has not had close medical followup, this was all quite new  to them, but they seem to understand.  We will answer more questions after  his TEE.            ______________________________  Noralyn Pick. Eden Emms, MD, [facility]     PCN/MedQ  DD:  12/08/2005  DT:  12/09/2005  Job #:  [id number]

## 2010-07-23 NOTE — Op Note (Signed)
[name], [name]                 ACCOUNT NO.:  [id number]   MEDICAL RECORD NO.:  [id number]          PATIENT TYPE:  INP   LOCATION:  2306                         FACILITY:  [facility]   PHYSICIAN:  [name], M.D.        DATE OF BIRTH:  [birth year]   DATE OF PROCEDURE:  01/17/2006  DATE OF DISCHARGE:                                 OPERATIVE REPORT   PROCEDURE:  Intraoperative transesophageal echocardiography.   Prior to the surgical procedure the patient was questioned about the  possibility of esophageal or gastric medical complications.  The patient  denied any.  The TEE will be used intraoperatively to assess the repair or  the replacement of the damaged mitral valve.  After induction with general  anesthesia the airway was secured with an oral endotracheal tube.  An oral  gastric tube was used to decompress the stomach and then removed.  The  transesophageal probe was heavily lubricated, placed in a sleeve which was  also lubricated and passed blindly down the oropharynx to the 45 cm mark.  There it remained throughout the case obtaining various Omniplane views.  The probe was left in the neutral and flexed position during the bypass.  At  the completion of the case the probe was removed.  There was no evidence of  oral or pharyngeal damage.  The pre-bypass examination revealed the left  ventricle to be normal sized.  There were no segmental defects detected.  Left atrium was enlarged.  The appendage was clean.  The septum was intact.  The mitral valve revealed thickened anterior and posterior leaflets.  The  anterior leaflet slightly prolapsed.  The posterior leaflet showed  significant prolapse with an exaggerated segment of knuckling.  This was  determined to be the middle scallop.  Color Doppler revealed 2-3+ mitral  regurgitation that showed the jet extending over the posterior leaflet.  No  reversal of flow could be documented in either pulmonary vein.  The aortic  valve had  three leaflets.  They opened and closed appropriately.  There was  no aortic insufficiency on color Doppler.  The tricuspid valve was normal in  appearance.  Color Doppler revealed trace regurgitant flow.  Swan-Ganz  catheter was noted across the valve at the time.  The patient was placed on  cardiopulmonary bypass and underwent placement of a mitral valve ring as  well as tangential wedge resection and repair of the posterior leaflet.  At  the completion of the bypass there were numerous bubbles which were cleared  with the left ventricular vent.  Contractility initially was significantly  depressed, especially in the posterior wall which was severely hypokinetic  to akinetic.  This improved with time after bypass suggesting air embolus  down the coronary artery as well as improved with inotropic Dopamine  support.  The mitral valve now showed a significantly reduced size to the  posterior leaflet.  There was no longer prolapse of the posterior leaflet.  The anterior and posterior leaflets appeared to be coapting well.  The ring  appeared to be well seated in  place.  Color Doppler revealed an  insignificant wisp of central mitral regurgitation that again appeared to be  coming from the middle scallop region.  There were no perivalvular leaks  detected.  The aortic valve was unchanged from the pre-bypass examination.  The septum remained intact and left ventricular function continues to  improve as time progressed after bypass.  There were no other changes from  the pre-bypass examination.           ______________________________  [name], M.D.     LK/MEDQ  D:  01/17/2006  T:  01/18/2006  Job:  [id number]   cc:   [name]. [name], M.D.

## 2010-07-23 NOTE — Assessment & Plan Note (Signed)
[facility]                                   ON-CALL NOTE   NAME:PECHEBayley, Hurn                          MRN:          [id number]  DATE:12/18/2005                            DOB:          Jul 20, 1950    PHONE CONVERSATION:  Patient of Dr. Eden Emms.  Patient called the telephone  service with complaints of feeling tired and weak.  I called Mr. Teche back  following his page and spoke with him extensively.  Patient reports that he  has been feeling fatigued throughout the day and anxious.  He reports that  he underwent a TEE on Monday for evaluation of his valvular heart disease.  He was told by Dr. Eden Emms at that time that he is going to require surgery.  He is scheduled for a heart catheterization to complete his preoperative  workup.  He reports this evening he is feeling anxious as he has been  thinking more and more about this surgery.  He denies any chest pain or  recent shortness of breath.  He denies any swelling.  He did report no  significant change in his activity.  He just feels more anxious and a little  more fatigued.  Due to this he called the service to discuss his symptoms.  I asked him if he had any palpitations or had had anything of syncopal  events.  In addition to the other questions he states that he is had not had  any discomfort or abnormal feelings in his chest at all. He has had no  palpitations, he has had no light headedness.  With this I felt that he  likely just had some anxiety related to his recent diagnosis and asked him  to call the clinic on Monday to discuss further with Dr. Eden Emms or his  extender his results and for possible anxiolytic therapy.  I also informed  the patient should he develop any palpitations or shortness of breath, any  lightheadedness or syncopal events, any increase in swelling, then he should  call us back immediately or go immediately to the emergency room.  He  understands this and agreed with our  plan.      ______________________________  Lorain Childes, MD    ______________________________  Noralyn Pick. Eden Emms, MD, [facility]     CGF/MedQ  DD:  12/18/2005  DT:  12/19/2005  Job #:  [id number]

## 2010-08-06 DIAGNOSIS — E291 Testicular hypofunction: Secondary | ICD-10-CM

## 2010-08-06 HISTORY — DX: Testicular hypofunction: E29.1

## 2010-10-18 ENCOUNTER — Encounter: Payer: Self-pay | Admitting: Cardiology

## 2010-11-11 ENCOUNTER — Ambulatory Visit: Payer: Self-pay | Admitting: Cardiology

## 2010-11-15 ENCOUNTER — Telehealth: Payer: Self-pay | Admitting: Cardiology

## 2010-11-15 NOTE — Telephone Encounter (Signed)
Office closed. Will call tomorrow

## 2010-11-15 NOTE — Telephone Encounter (Signed)
Does pt need to be pre-med before dental work. Pt at office now.

## 2010-11-16 NOTE — Telephone Encounter (Signed)
Will ask Dr Hochrein 

## 2010-11-16 NOTE — Telephone Encounter (Signed)
[name] needs scaling and root planing.  The dental office wants to know if he needs pre-medication.

## 2010-11-17 NOTE — Telephone Encounter (Signed)
He does not need prophylaxis

## 2010-11-17 NOTE — Telephone Encounter (Signed)
Left message for dentist office that pt does not need SBE

## 2010-11-23 ENCOUNTER — Telehealth: Payer: Self-pay | Admitting: Cardiology

## 2010-11-23 NOTE — Telephone Encounter (Signed)
Per Arline Asp calling back regarding pre-med for dental work.  appt in oct. Fax # [phone]

## 2010-11-23 NOTE — Telephone Encounter (Signed)
Note faxed to [facility] at number listed

## 2010-12-09 ENCOUNTER — Encounter: Payer: Self-pay | Admitting: Cardiology

## 2011-03-24 ENCOUNTER — Ambulatory Visit: Payer: Self-pay | Admitting: Cardiology

## 2011-03-31 ENCOUNTER — Encounter: Payer: Self-pay | Admitting: Cardiology

## 2011-03-31 ENCOUNTER — Ambulatory Visit: Payer: Self-pay | Admitting: Cardiology

## 2011-03-31 ENCOUNTER — Ambulatory Visit (INDEPENDENT_AMBULATORY_CARE_PROVIDER_SITE_OTHER): Payer: BC Managed Care – PPO | Admitting: Cardiology

## 2011-03-31 DIAGNOSIS — R0600 Dyspnea, unspecified: Secondary | ICD-10-CM

## 2011-03-31 DIAGNOSIS — R0602 Shortness of breath: Secondary | ICD-10-CM

## 2011-03-31 DIAGNOSIS — R0609 Other forms of dyspnea: Secondary | ICD-10-CM

## 2011-03-31 DIAGNOSIS — I080 Rheumatic disorders of both mitral and aortic valves: Secondary | ICD-10-CM

## 2011-03-31 DIAGNOSIS — R0989 Other specified symptoms and signs involving the circulatory and respiratory systems: Secondary | ICD-10-CM

## 2011-03-31 NOTE — Patient Instructions (Signed)
The current medical regimen is effective;  continue present plan and medications.  Follow up in 1 year with Dr Hochrein.  You will receive a letter in the mail 2 months before you are due.  Please call us when you receive this letter to schedule your follow up appointment.  

## 2011-03-31 NOTE — Assessment & Plan Note (Signed)
This seems to have resolved. No further cardiovascular testing is suggested.

## 2011-03-31 NOTE — Assessment & Plan Note (Signed)
At this point he needs no further imaging. He has no symptoms or change in his physical exam. I will follow him up in one year.

## 2011-03-31 NOTE — Progress Notes (Signed)
   HPI The patient presents for followup of mitral regurgitation status post repair. In 2011 he was having dyspnea. He did have some mitral regurgitation on TEE but this was mild to moderate. It is much improved compared with prior to his surgery. Cardiopulmonary stress testing could not identify an etiology for this exercise-induced dyspnea. However, since that time he has done much better. He denies any ongoing shortness of breath. He's had no PND or orthopnea. He's had no palpitations, presyncope or syncope. He has had no chest pressure, neck or arm discomfort.  No Known Allergies  Current Outpatient Prescriptions  Medication Sig Dispense Refill  . aspirin 81 MG tablet Take 81 mg by mouth daily.        . metoprolol succinate (TOPROL-XL) 25 MG 24 hr tablet Take 12.5 mg by mouth daily.        . solifenacin (VESICARE) 10 MG tablet Take 5 mg by mouth daily.          Past Medical History  Diagnosis Date  . Prolapse of mitral valve     s/p repair  . Mitral valve regurgitation     s/p repair  . Obesity   . Atrial fibrillation     postoperative  . Anxiety   . BPH (benign prostatic hypertrophy)     Past Surgical History  Procedure Date  . Mitral valve repair     ROS:  As stated in the HPI and negative for all other systems.  PHYSICAL EXAM BP 125/90  Pulse 75  Ht 5\' 8"  (1.727 m)  Wt 189 lb (85.73 kg)  BMI 28.74 kg/m2 GENERAL:  Well appearing HEENT:  Pupils equal round and reactive, fundi not visualized, oral mucosa unremarkable NECK:  No jugular venous distention, waveform within normal limits, carotid upstroke brisk and symmetric, no bruits, no thyromegaly LYMPHATICS:  No cervical, inguinal adenopathy LUNGS:  Clear to auscultation bilaterally BACK:  No CVA tenderness CHEST:  Well healed surgical scar HEART:  PMI not displaced or sustained,S1 and S2 within normal limits, no S3, no S4, no clicks, no rubs, no murmurs ABD:  Flat, positive bowel sounds normal in frequency in  pitch, no bruits, no rebound, no guarding, no midline pulsatile mass, no hepatomegaly, no splenomegaly EXT:  2 plus pulses throughout, no edema, no cyanosis no clubbing SKIN:  No rashes no nodules NEURO:  Cranial nerves II through XII grossly intact, motor grossly intact throughout [facility]:  Cognitively intact, oriented to person place and time  EKG:  Sinus rhythm, incomplete right bundle branch block, no acute ST-T wave changes, rate 75 03/31/2011   ASSESSMENT AND PLAN

## 2011-04-06 ENCOUNTER — Telehealth: Payer: Self-pay | Admitting: Cardiology

## 2011-04-06 NOTE — Telephone Encounter (Signed)
Left message to call back  

## 2011-04-06 NOTE — Telephone Encounter (Signed)
Pt having feeling in his heart that he never felt before and wants to talk about it. No chest pain just a feeling he can not explain

## 2011-04-22 NOTE — Telephone Encounter (Signed)
Pt has never returned my calls

## 2011-05-24 ENCOUNTER — Ambulatory Visit: Payer: BC Managed Care – PPO | Admitting: Family Medicine

## 2011-06-02 ENCOUNTER — Other Ambulatory Visit: Payer: Self-pay | Admitting: Cardiology

## 2011-06-02 NOTE — Telephone Encounter (Signed)
Refill -Metoprolol succinate (TOPROL-XL) 25 MG 24 hr tablet   Patient verified [facility], he can be reached at [phone] for additional information.

## 2011-06-06 MED ORDER — METOPROLOL SUCCINATE ER 25 MG PO TB24: 12.5000 mg | ORAL_TABLET | Freq: Every day | ORAL | Status: DC

## 2011-06-06 NOTE — Telephone Encounter (Signed)
..   Requested Prescriptions   Pending Prescriptions Disp Refills  . metoprolol succinate (TOPROL-XL) 25 MG 24 hr tablet 15 tablet 12    Sig: Take 0.5 tablets (12.5 mg total) by mouth daily.

## 2011-06-23 ENCOUNTER — Ambulatory Visit (INDEPENDENT_AMBULATORY_CARE_PROVIDER_SITE_OTHER): Payer: BC Managed Care – PPO | Admitting: Family Medicine

## 2011-06-23 ENCOUNTER — Encounter: Payer: Self-pay | Admitting: Family Medicine

## 2011-06-23 VITALS — BP 113/74 | HR 70 | Ht 68.0 in | Wt 188.0 lb

## 2011-06-23 DIAGNOSIS — IMO0001 Reserved for inherently not codable concepts without codable children: Secondary | ICD-10-CM

## 2011-06-23 DIAGNOSIS — M791 Myalgia, unspecified site: Secondary | ICD-10-CM

## 2011-06-23 DIAGNOSIS — Z1211 Encounter for screening for malignant neoplasm of colon: Secondary | ICD-10-CM

## 2011-06-23 DIAGNOSIS — R5383 Other fatigue: Secondary | ICD-10-CM

## 2011-06-23 DIAGNOSIS — R5381 Other malaise: Secondary | ICD-10-CM

## 2011-06-23 DIAGNOSIS — Z87898 Personal history of other specified conditions: Secondary | ICD-10-CM

## 2011-06-23 LAB — COMPREHENSIVE METABOLIC PANEL
ALT: 32 U/L (ref 0–53)
AST: 33 U/L (ref 0–37)
Albumin: 4.1 g/dL (ref 3.5–5.2)
Alkaline Phosphatase: 62 U/L (ref 39–117)
BUN: 20 mg/dL (ref 6–23)
Creatinine, Ser: 1 mg/dL (ref 0.4–1.5)
Potassium: 4.3 mEq/L (ref 3.5–5.1)

## 2011-06-23 LAB — CBC WITH DIFFERENTIAL/PLATELET
Basophils Absolute: 0.1 10*3/uL (ref 0.0–0.1)
HCT: 47.7 % (ref 39.0–52.0)
Lymphs Abs: 2.6 10*3/uL (ref 0.7–4.0)
Monocytes Relative: 6.9 % (ref 3.0–12.0)
Neutrophils Relative %: 48.4 % (ref 43.0–77.0)
Platelets: 180 10*3/uL (ref 150.0–400.0)
RDW: 12.7 % (ref 11.5–14.6)
WBC: 6.7 10*3/uL (ref 4.5–10.5)

## 2011-06-23 LAB — TSH: TSH: 2.47 u[IU]/mL (ref 0.35–5.50)

## 2011-06-23 LAB — SEDIMENTATION RATE: Sed Rate: 3 mm/hr (ref 0–22)

## 2011-06-24 LAB — TESTOSTERONE: Testosterone: 332.19 ng/dL — ABNORMAL LOW (ref 350.00–890.00)

## 2011-06-27 DIAGNOSIS — R5383 Other fatigue: Secondary | ICD-10-CM | POA: Insufficient documentation

## 2011-06-27 DIAGNOSIS — Z1211 Encounter for screening for malignant neoplasm of colon: Secondary | ICD-10-CM | POA: Insufficient documentation

## 2011-06-27 NOTE — Progress Notes (Signed)
Office Note 06/27/2011  CC:  Chief Complaint  Patient presents with  . Establish Care    urinary issues    HPI:  [name] is a 61 y.o. White male who is here to establish care. Patient's most recent primary MD: Silas Sacramento, FNP.  Urologist is Dr. Lovenia Shuck in [place], [place]. Old records in EPIC/HL reviewed prior to or during today's visit.  Has one issue today to discuss: approx 3-4 wk hx of nearly daily feeling of fatigue and achiness.  Wakes up, doesn't feel rested but energy level is decent.  Then as day goes on he gets a bad feeling of body fatigue, weariness/achy all over, without any other symptoms (including no CP, no SOB, no cough, no URI, no HA, no joint swelling or redness or warmth, no rash, no new hearing problems, no vision problems, no focal weakness, no abdominal pains, no nausea, no constip or diarrhea.  He has chronic urinary urgency and frequency that is unchanged lately, he's able to deal with fine lately---has been unresponsive to trial of vesicare so his urologist recently told him to d/c this med).      Of note, his wife did leave him last week, is living with another man.  He admits this has him down/frustrated, but goes on to say that this sort of this is nothing new with his relationship with his wife, and he doesn't think it is playing a big role in his physical sx's.  However, he does admit to feeling down some days and supposes he could be depressed.  He denies crying spells, thoughts of suicide or homicide, hopelessness, excessive guilt, or anhedonia.  Past Medical History  Diagnosis Date  . Prolapse of mitral valve     s/p repair  . Mitral valve regurgitation     s/p repair  . Obesity   . Atrial fibrillation     postoperative  . Anxiety   . BPH (benign prostatic hypertrophy)   . Urge incontinence     Past Surgical History  Procedure Date  . Mitral valve repair 2007    Dr. Cornelius Moras  . Colonoscopy 2003    Normal per pt: Dr. Gabriel Cirri in [place]  .  Hemorrhoid surgery 2000    doing fine since    Family History  Problem Relation Age of Onset  . Alcohol abuse Mother   . Alcohol abuse Father   . Cancer Father     prostate    History   Social History  . Marital Status: Married    Spouse Name: N/A    Number of Children: N/A  . Years of Education: N/A   Occupational History  . 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 Smoking status: Never Smoker   . Smokeless tobacco: Never Used  . Alcohol Use: No  . Drug Use: No  . Sexually Active: Not on file   Other Topics Concern  . Not on file   Social History Narrative   Married 40 yrs, wife left him last week.Truck driver, delivers propane.Has 3 grown children.    Outpatient Encounter Prescriptions as of 06/23/2011  Medication Sig Dispense Refill  . aspirin 81 MG tablet Take 81 mg by mouth daily.        . metoprolol succinate (TOPROL-XL) 25 MG 24 hr tablet Take 0.5 tablets (12.5 mg total) by mouth daily.  15 tablet  12  . solifenacin (VESICARE) 10 MG tablet Take 5  mg by mouth daily.          No Known Allergies  ROS Review of Systems  Constitutional: Positive for fatigue. Negative for fever, chills and appetite change.  HENT: Positive for hearing loss (chronic). Negative for ear pain, congestion, sore throat, neck stiffness and dental problem.   Eyes: Negative for discharge, redness and visual disturbance.  Respiratory: Negative for cough, chest tightness, shortness of breath and wheezing.   Cardiovascular: Negative for chest pain, palpitations and leg swelling.  Gastrointestinal: Negative for nausea, vomiting, abdominal pain, diarrhea and blood in stool.  Genitourinary: Positive for urgency (chronic) and frequency (chronic). Negative for dysuria, hematuria, flank pain and difficulty urinating.  Musculoskeletal: Positive for myalgias. Negative for back pain, joint swelling and arthralgias.  Skin: Negative for pallor and rash.    Neurological: Negative for dizziness, speech difficulty, weakness and headaches.  Hematological: Negative for adenopathy. Does not bruise/bleed easily.  Psychiatric/Behavioral: Positive for dysphoric mood. Negative for confusion and sleep disturbance. The patient is not nervous/anxious.     PE; Blood pressure 113/74, pulse 70, height 5\' 8"  (1.727 m), weight 188 lb (85.276 kg), SpO2 94.00%. Gen: Alert, well appearing.  Patient is oriented to person, place, time, and situation. Affect: pleasant, affable.  Lucid thought and speech ENT: Ears: EACs clear, normal epithelium.  TMs with good light reflex and landmarks bilaterally.  Eyes: no injection, icteris, swelling, or exudate.  EOMI, PERRLA. Nose: no drainage or turbinate edema/swelling.  No injection or focal lesion.  Mouth: lips without lesion/swelling.  Oral mucosa pink and moist.  Dentition intact and without obvious caries or gingival swelling.  Oropharynx without erythema, exudate, or swelling.  Neck - No masses or thyromegaly or limitation in range of motion CV: RRR, no m/r/g.  Mid systolic click audible.   LUNGS: CTA bilat, nonlabored resps, good aeration in all lung fields. ABD: soft, NT, ND, BS normal.  No hepatospenomegaly or mass.  No bruits. EXT: no clubbing, cyanosis, or edema.    Pertinent labs:  None today  ASSESSMENT AND PLAN:   Fatigue Nonspecific. R/o anemia, hypothyroidism, and hypogonadism.  Basic blood lab panel drawn today. Reassured pt.  Questioning regarding OSA were negative, but if all labs normal and pt continues to be absent of convincing sx's of mood disorder to explain his sx's, will do more formal screening for OSA, possibly recommend sleep study. F/u in office in 1 mo.  Colon cancer screening Pt due for repeat colonoscopy (last was 10 yrs ago). Will order referral to [facility].  BENIGN PROSTATIC HYPERTROPHY, HX OF Also sounds like he was treated for urge incontinence but this treatment was  unsuccessful. He is stable regarding urinary system at this time.  He does say that if he requires urologic f/u he would prefer to re-establish care with Dr. Brunilda Payor with [facility] rather than continue on with Dr. Edwyna Shell in [place].   No new meds or referrals for this problem today.     Return in about 1 month (around 07/23/2011) for f/u fatigue/myalgias.

## 2011-06-27 NOTE — Assessment & Plan Note (Signed)
Also sounds like he was treated for urge incontinence but this treatment was unsuccessful. He is stable regarding urinary system at this time.  He does say that if he requires urologic f/u he would prefer to re-establish care with Dr. Brunilda Payor with [facility] rather than continue on with Dr. Edwyna Shell in [place].   No new meds or referrals for this problem today.

## 2011-06-27 NOTE — Assessment & Plan Note (Signed)
Pt due for repeat colonoscopy (last was 10 yrs ago). Will order referral to [facility].

## 2011-06-27 NOTE — Assessment & Plan Note (Signed)
Nonspecific. R/o anemia, hypothyroidism, and hypogonadism.  Basic blood lab panel drawn today. Reassured pt.  Questioning regarding OSA were negative, but if all labs normal and pt continues to be absent of convincing sx's of mood disorder to explain his sx's, will do more formal screening for OSA, possibly recommend sleep study. F/u in office in 1 mo.

## 2011-07-04 ENCOUNTER — Encounter: Payer: Self-pay | Admitting: Family Medicine

## 2011-07-04 ENCOUNTER — Ambulatory Visit (INDEPENDENT_AMBULATORY_CARE_PROVIDER_SITE_OTHER): Payer: BC Managed Care – PPO | Admitting: Family Medicine

## 2011-07-04 VITALS — BP 113/77 | HR 79 | Ht 68.0 in | Wt 188.0 lb

## 2011-07-04 DIAGNOSIS — H6690 Otitis media, unspecified, unspecified ear: Secondary | ICD-10-CM

## 2011-07-04 DIAGNOSIS — M791 Myalgia, unspecified site: Secondary | ICD-10-CM

## 2011-07-04 DIAGNOSIS — F329 Major depressive disorder, single episode, unspecified: Secondary | ICD-10-CM

## 2011-07-04 DIAGNOSIS — IMO0001 Reserved for inherently not codable concepts without codable children: Secondary | ICD-10-CM

## 2011-07-04 DIAGNOSIS — E291 Testicular hypofunction: Secondary | ICD-10-CM

## 2011-07-04 DIAGNOSIS — Z125 Encounter for screening for malignant neoplasm of prostate: Secondary | ICD-10-CM | POA: Insufficient documentation

## 2011-07-04 DIAGNOSIS — R5383 Other fatigue: Secondary | ICD-10-CM

## 2011-07-04 DIAGNOSIS — F32A Depression, unspecified: Secondary | ICD-10-CM | POA: Insufficient documentation

## 2011-07-04 DIAGNOSIS — E538 Deficiency of other specified B group vitamins: Secondary | ICD-10-CM

## 2011-07-04 LAB — VITAMIN B12: Vitamin B-12: 454 pg/mL (ref 211–911)

## 2011-07-04 MED ORDER — TESTOSTERONE 10 MG/ACT (2%) TD GEL: 40.0000 mg | Freq: Every day | TRANSDERMAL | Status: DC

## 2011-07-04 MED ORDER — AMOXICILLIN 875 MG PO TABS: 875.0000 mg | ORAL_TABLET | Freq: Two times a day (BID) | ORAL | Status: AC

## 2011-07-04 MED ORDER — BUPROPION HCL ER (XL) 150 MG PO TB24: 150.0000 mg | ORAL_TABLET | Freq: Every day | ORAL | Status: DC

## 2011-07-04 NOTE — Progress Notes (Signed)
OFFICE NOTE  07/05/2011  CC:  Chief Complaint  Patient presents with  . Follow-up    discuss testosterone results     HPI: Patient is a 61 y.o. Caucasian male who is here for discussion of low testosterone. I first saw him a couple of weeks ago for chronic fatigue/achiness and lab eval was normal except mildly low testosterone.  Review of labs from prior MD recently showed that his testost in the past was as low as the 200s and it appears he was rx'd axiron in the past but the notes are unhelpful regarding compliance/follow up, except a f/u testosterone level at one point was >400.  He reports today that he took axiron for 57mo and stopped it b/c he felt no improvement and hated rubbing the gel on himself.  He continues to complain of several weeks of feeling very tired/fatigued, achy all over--"flu-like", but without any fever, cough, nasal sx's, ST, rash, or HA.  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 No rash.  No joint swelling, redness, or warmth.  No night sweats, no loss of appetite, no sleep dysfunction (denies hx of snoring or witnessed apnea:  Sleep questionnaire filled out today scored a 3= low risk of OSA). He reiterates that he has a lot of problems in his life right now: his wife left him and is living with another man, his daughter and grandson whom he dearly cared about moved out of his home, he recently had to declare bankruptcy. He won't admit to depressed/sad mood but keeps saying "I've got enough going on to be depressed about". No crying spells.  +Anhedonia.  No suicidal or homicidal thoughts.  Reports distant hx of depression and [facility] when his wife left him a different time, was put on prozac for a while but didn't like the way it made him feel.  No other antidepressants tried.  We reviewed all labs from last visit in detail:  essenatially all normal.  Pertinent PMH:  Past Medical History  Diagnosis Date  . Prolapse of mitral valve     s/p repair  . Mitral valve regurgitation     s/p repair  . Obesity   . Atrial fibrillation     postoperative  . Anxiety   . BPH (benign prostatic hypertrophy)   . Urge incontinence   . Hypogonadism male 08/2010    Axiron started--?compliance? (repeat value 01/2011 mildly improved).    MEDS:  Outpatient Prescriptions Prior to Visit  Medication Sig Dispense Refill  . aspirin 81 MG tablet Take 81 mg by mouth daily.        . metoprolol succinate (TOPROL-XL) 25 MG 24 hr tablet Take 0.5 tablets (12.5 mg total) by mouth daily.  15 tablet  12  . solifenacin (VESICARE) 10 MG tablet Take 5 mg by mouth daily.          PE: Blood pressure 113/77, pulse 79, height 5\' 8"  (1.727 m), weight 188 lb (85.276 kg). Gen: Alert, well appearing.  Patient is oriented to person, place, time, and situation. Affect: acts friendly but a bit "down on his luck", then at times acts stoic.  No flight of ideas or racing thoughts, no pressured speech.  Lucid thought and conversation. Left ear with minimal moisture in distal EAC, middle ear cavity appears to be filled with soft mucosal lining and  is erythematous.  Mild discomfort with manipulation of external ear.  No edema of EAC, no EAC exudate. NECK: no LAD CV: RRR, no m/r/g.   LUNGS: CTA bilat, nonlabored resps, good aeration in all lung fields. ABD: soft, NT, ND, BS normal.  No hepatospenomegaly or mass.  No bruits. EXT: no clubbing, cyanosis, or edema.  Muscles and joints: nontender.  ROM fully intact and without pain.  No joint swelling, erythema, or warmth.    IMPRESSION AND PLAN:  Depression Start trial of wellbutrin XL 150mg  qd.  Therapeutic expectations and side effect profile of medication discussed today.  Patient's questions answered. He has lots of extraneous/outside social circumstances that are currently unchangeable,  unfortunately. He declines counseling due to potential cost.  Hypogonadism male May be contributing some to his current symptoms. Start fortesta trial: 2 pumps rubbed on each thigh once daily.  Coupon for 30 days of med free + limitation of copay to no more than 25$ per month given today. Will give it at least a month of daily use and then check testost level 2 hours after he applies the gel. Will check LH and prolactin today.  Myalgia With exam and labs unremarkable, I strongly suspect that these are physical manifestations of stress and depression. Will check CPK level for completeness today.  Acute otitis media Left sided.  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 He is very cost conscious and the otic drops won't be affordable and he won't get them, so we'll treat with oral abx only: amoxil 875mg  bid x 10d.      FOLLOW UP: 1 mo

## 2011-07-05 DIAGNOSIS — M791 Myalgia, unspecified site: Secondary | ICD-10-CM | POA: Insufficient documentation

## 2011-07-05 DIAGNOSIS — H6690 Otitis media, unspecified, unspecified ear: Secondary | ICD-10-CM | POA: Insufficient documentation

## 2011-07-05 LAB — PROLACTIN: Prolactin: 4.6 ng/mL (ref 2.1–17.1)

## 2011-07-05 NOTE — Assessment & Plan Note (Signed)
Start trial of wellbutrin XL 150mg  qd.  Therapeutic expectations and side effect profile of medication discussed today.  Patient's questions answered. He has lots of extraneous/outside social circumstances that are currently unchangeable, unfortunately. He declines counseling due to potential cost.

## 2011-07-05 NOTE — Assessment & Plan Note (Signed)
Left sided.  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 He is very cost conscious and the otic drops won't be affordable and he won't get them, so we'll treat with oral abx only: amoxil 875mg  bid x 10d.

## 2011-07-05 NOTE — Assessment & Plan Note (Signed)
May be contributing some to his current symptoms. Start fortesta trial: 2 pumps rubbed on each thigh once daily.  Coupon for 30 days of med free + limitation of copay to no more than 25$ per month given today. Will give it at least a month of daily use and then check testost level 2 hours after he applies the gel. Will check LH and prolactin today.

## 2011-07-05 NOTE — Assessment & Plan Note (Signed)
With exam and labs unremarkable, I strongly suspect that these are physical manifestations of stress and depression. Will check CPK level for completeness today.

## 2011-07-06 LAB — METHYLMALONIC ACID, SERUM: Methylmalonic Acid, Quant: 0.63 umol/L — ABNORMAL HIGH (ref <0.40)

## 2011-08-08 ENCOUNTER — Telehealth: Payer: Self-pay | Admitting: Family Medicine

## 2011-08-08 NOTE — Telephone Encounter (Signed)
Patient said his ear is still hurting, the antibotic isn't working, is there any one that Dr Milinda Cave recommends? If you don't get him on the phone patient said okay to lv him a VM.

## 2011-08-08 NOTE — Telephone Encounter (Signed)
Message left for pt that we often refer to [facility].  Advised if this is not the kind of info he was looking for to give me a call.

## 2011-08-09 NOTE — Telephone Encounter (Signed)
Pt states he is having pain and oral abx did not work.  Advised last given meds on 07/04/11 and pt would need to be seen.  appt scheduled for 3:45 on 08/10/11.

## 2011-08-10 ENCOUNTER — Other Ambulatory Visit: Payer: Self-pay | Admitting: Family Medicine

## 2011-08-10 ENCOUNTER — Encounter: Payer: Self-pay | Admitting: Family Medicine

## 2011-08-10 ENCOUNTER — Ambulatory Visit (INDEPENDENT_AMBULATORY_CARE_PROVIDER_SITE_OTHER): Payer: BC Managed Care – PPO | Admitting: Family Medicine

## 2011-08-10 VITALS — BP 151/93 | HR 87 | Temp 97.8°F | Ht 68.0 in | Wt 189.4 lb

## 2011-08-10 DIAGNOSIS — H6690 Otitis media, unspecified, unspecified ear: Secondary | ICD-10-CM

## 2011-08-10 DIAGNOSIS — H6610 Chronic tubotympanic suppurative otitis media, unspecified: Secondary | ICD-10-CM

## 2011-08-10 MED ORDER — CIPROFLOXACIN HCL 500 MG PO TABS: 500.0000 mg | ORAL_TABLET | Freq: Two times a day (BID) | ORAL | Status: AC

## 2011-08-10 MED ORDER — FLUCONAZOLE 150 MG PO TABS: ORAL_TABLET | ORAL | Status: DC

## 2011-08-10 NOTE — Assessment & Plan Note (Addendum)
Will send swab of drainage for bacterial culture and for fungal smear and culture. Antibacterial and antifungal ear drops would be ideal, but pt states he simply cannot afford to pay for this costly med. As the next best thing, I rx'd cipro 500 mg po bid x 10d and diflucan 150mg  qd x 5d.   We had a long discussion about the longterm plan for his ear, since this problem has been recurrent and won't likely go away unless he gets his TM repaired/reconstructed.  He indicates that the local ENT MD's he has seen have told him they cannot help him with this (surgery), and he is ok with trying a referral to a tertiary care center where they may assess him and give him a second opinion.  He lives closest to [facility] so we'll put the referral in for there.   F/u here 55mo--in the meantime I encouraged him to start his wellbutrin and his fortesta.

## 2011-08-10 NOTE — Progress Notes (Signed)
OFFICE NOTE  08/10/2011  CC:  Chief Complaint  Patient presents with  . ear drainage    left X 2 months     HPI: Patient is a 61 y.o. Caucasian male who is here for left ear drainage. Going on for "months", has long history of this problem; has no ear drum on left side. Denies ear pain.  The drainage is whitish.  It stopped for a week or so when I rx'd amoxil about 6 wks ago for him, but returned the same again.  No fevers.  Hearing is chronically diminished bilaterally, L>R.  We discussed his depression and hypogonadism meds some today: he has not started the wellbutrin or the [place] yet, says he might/might not.  He goes over the main external life factors that are plaguing him lately: bankruptcy, wife having affair/divorce, daughter with "worthless" boyfriend, work is stressful and he wants to retire but he can't.  Denies suicidal thinking.  Admits to poor energy level chronically.   Pertinent PMH:  Past Medical History  Diagnosis Date  . Prolapse of mitral valve     s/p repair  . Mitral valve regurgitation     s/p repair  . Obesity   . Atrial fibrillation     postoperative  . Anxiety   . BPH (benign prostatic hypertrophy)   . Urge incontinence   . Hypogonadism male 08/2010    Axiron started--?compliance? (repeat value 01/2011 mildly improved).    MEDS:  Outpatient Prescriptions Prior to Visit  Medication Sig Dispense Refill  . aspirin 81 MG tablet Take 81 mg by mouth daily.        . metoprolol succinate (TOPROL-XL) 25 MG 24 hr tablet Take 0.5 tablets (12.5 mg total) by mouth daily.  15 tablet  12  . buPROPion (WELLBUTRIN XL) 150 MG 24 hr tablet Take 1 tablet (150 mg total) by mouth daily.  30 tablet  1  . Testosterone (FORTESTA) 10 MG/ACT (2%) GEL Place 40 mg onto the skin daily.  60 g  5  . solifenacin (VESICARE) 10 MG tablet Take 5 mg by mouth daily.          PE: Blood pressure 151/93, pulse 87, temperature 97.8 F (36.6 C), temperature source Temporal, height 5'  8" (1.727 m), weight 189 lb 6.4 oz (85.911 kg), SpO2 95.00%. Gen: Alert, well appearing.  Patient is oriented to person, place, time, and situation. Right TM intact, pearly color. Left TM absent.  Deep reddish granulation-type tissue visible in middle ear cavity, with moderate amount of erythema and whitish/milky drainage in distal ear canal.  No significant swelling of the EAC epithelium.  No pain with palpation of external ear anatomy.  No pre or post auricular lymph nodes palpable.  No erythema of external ear.  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 Antibacterial and antifungal ear drops would be ideal, but pt states he simply cannot afford to pay for this costly med. As the next best thing, I rx'd cipro 500 mg po bid x 10d and diflucan 150mg  qd x 5d.   We had a long discussion about the longterm plan for his ear, since this problem has been recurrent and won't likely go away unless he gets his TM repaired/reconstructed.  He indicates that the local ENT MD's he has seen have told him they cannot help him with this (surgery), and he  is ok with trying a referral to a tertiary care center where they may assess him and give him a second opinion.  He lives closest to [facility] so we'll put the referral in for there.   F/u here 60mo--in the meantime I encouraged him to start his wellbutrin and his fortesta.      FOLLOW UP:  75mo

## 2011-08-12 ENCOUNTER — Other Ambulatory Visit: Payer: Self-pay | Admitting: Family Medicine

## 2011-08-12 MED ORDER — SULFAMETHOXAZOLE-TMP DS 800-160 MG PO TABS: ORAL_TABLET | ORAL | Status: DC

## 2011-08-12 NOTE — Progress Notes (Signed)
Of note, my result note from today stated that I was going to get him on clindamycin but I changed my mind and put him on bactrim DS 1 bid x 14d.--PM

## 2011-08-19 ENCOUNTER — Encounter: Payer: Self-pay | Admitting: Family Medicine

## 2011-08-23 ENCOUNTER — Encounter: Payer: Self-pay | Admitting: Family Medicine

## 2011-08-23 ENCOUNTER — Ambulatory Visit (INDEPENDENT_AMBULATORY_CARE_PROVIDER_SITE_OTHER): Payer: BC Managed Care – PPO | Admitting: Family Medicine

## 2011-08-23 VITALS — BP 115/84 | HR 67 | Temp 97.4°F | Ht 68.0 in | Wt 188.0 lb

## 2011-08-23 DIAGNOSIS — M109 Gout, unspecified: Secondary | ICD-10-CM

## 2011-08-23 DIAGNOSIS — R202 Paresthesia of skin: Secondary | ICD-10-CM | POA: Insufficient documentation

## 2011-08-23 DIAGNOSIS — R209 Unspecified disturbances of skin sensation: Secondary | ICD-10-CM

## 2011-08-23 DIAGNOSIS — H6610 Chronic tubotympanic suppurative otitis media, unspecified: Secondary | ICD-10-CM

## 2011-08-23 LAB — URIC ACID: Uric Acid, Serum: 7.3 mg/dL (ref 4.0–7.8)

## 2011-08-23 MED ORDER — CLONAZEPAM 1 MG PO TABS: ORAL_TABLET | ORAL | Status: DC

## 2011-08-23 NOTE — Assessment & Plan Note (Signed)
+   itching--he seems to think the itching was more prominent than the tingling feeling. No rash.   Doubt allergic rxn. Sleep apnea questionnaire indicates low risk for OSA. He has RLS intermittently and I'll start him on prn clonazepam 1mg  for this, #30, RF x 1.  Therapeutic expectations and side effect profile of medication discussed today.  Patient's questions answered. Also, given unclear vit B12 labs in April 2013, will recheck vit B12 as well as methylmalonic acid today. Will also check uric acid level, but he describes a musculotendonous pain in his right forearm around the medial epicondyle, not an inflammed joint.

## 2011-08-23 NOTE — Progress Notes (Signed)
OFFICE NOTE  08/23/2011  CC:  Chief Complaint  Patient presents with  . Allergic Reaction    woke up this AM with itching all over, no hives, no swelling; took indomethacin at 3 AM, is on Bactrim for ear infection     HPI: Patient is a 61 y.o. Caucasian male who is here for itching, fear of allergic rxn to med. Took indocin about 3AM today for right antecubital fossa area on right arm (his shifting arm) b/c he thought it may be gout.  He woke up this morning with itching/tingling of hands and feet only.  Breathing and throat feel normal.  No fever.  The hands/feet itching lasted approx 2 hrs and spontaneously resolved prior to taking any benadryl. He has been on bactrim for about 10d for left OM and the ear has quit draining. 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  He has taken both sulfa drugs and indocin w/out problem in the past.  Pertinent PMH:  Past Medical History  Diagnosis Date  . Prolapse of mitral valve     s/p repair  . Mitral valve regurgitation     s/p repair  . Obesity   . Atrial fibrillation     postoperative  . Anxiety   . BPH (benign prostatic hypertrophy)   . Urge incontinence   . Hypogonadism male 08/2010    Axiron started--?compliance? (repeat value 01/2011 mildly improved).    MEDS:  Outpatient Prescriptions Prior to Visit  Medication Sig Dispense Refill  . aspirin 81 MG tablet Take 81 mg by mouth daily.        . metoprolol succinate (TOPROL-XL) 25 MG 24 hr tablet Take 0.5 tablets (12.5 mg total) by mouth daily.  15 tablet  12  . sulfamethoxazole-trimethoprim (BACTRIM DS) 800-160 MG per tablet 1 tab po bid x 14 days  28 tablet  0  . buPROPion (WELLBUTRIN XL) 150 MG 24 hr tablet Take 1 tablet (150 mg total) by mouth daily.  30 tablet  1  . fluconazole (DIFLUCAN) 150 MG tablet 1 tab po qd x 5d  5 tablet  0  . Testosterone (FORTESTA) 10 MG/ACT (2%) GEL Place 40 mg onto the skin daily.   60 g  5  Note: he is not taking testosterone, fluconazole, or bupropion as listed above.  PE: Blood pressure 115/84, pulse 67, temperature 97.4 F (36.3 C), temperature source Temporal, height 5\' 8"  (1.727 m), weight 188 lb (85.276 kg). Gen: Alert, tired-appearing.  Patient is oriented to person, place, time, and situation. Psych: affect pleasant, thought and speech lucid. ENT: Right EAC and TM normal.  Left EAC with minimal debris abutting TM, TM with polypoid/moist mucosal appearance to surface, with decent sized chronic oval perforation centrally. Eyes: no injection, icteris, swelling, or exudate.  EOMI, PERRLA. Nose: no drainage or turbinate edema/swelling.  No injection or focal lesion.  Mouth: lips without lesion/swelling.  Oral mucosa pink and moist.  Dentition intact and without obvious caries or gingival swelling.  Oropharynx without erythema, exudate, or swelling.  Neck - No masses or thyromegaly or limitation in range of motion CV: RRR, no m/r/g.   LUNGS: CTA bilat, nonlabored resps, good aeration in all lung fields. Hands and feet: normal sensation, normal pulses, normal color.  Mild hyperkeratotic papular rash coalescing on left thenar region--chronic per pt--waxes and wanes.  SLEEP QUESTIONNAIRE TODAY: score of 4 (low risk)  IMPRESSION AND PLAN:  Paresthesias + itching--he seems to think the itching was more prominent than the tingling feeling. No rash.   Doubt allergic rxn. Sleep apnea questionnaire indicates low risk for OSA. He has RLS intermittently and I'll start him on prn clonazepam 1mg  for this, #30, RF x 1.  Therapeutic expectations and side effect profile of medication discussed today.  Patient's questions answered. Also, given unclear vit B12 labs in April 2013, will recheck vit B12 as well as methylmalonic acid today. Will also check uric acid level, but he describes a musculotendonous pain in his right forearm around the medial epicondyle, not an inflammed  joint.  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 I told him to go ahead and stop this med now, but I do not think he has an allergy to this med (or to indocin). He has paperwork to fill out in order to get scheduled to see ENT at [facility].     FOLLOW UP: to be determined based on lab results and how he does over the next couple of days.

## 2011-08-23 NOTE — Patient Instructions (Signed)
Stop bactrim. Call if you develops an itchy rash in the next couple of days.

## 2011-08-23 NOTE — Assessment & Plan Note (Signed)
Recent increased volume and purulence of left ear drainage, +staph aureus that was pansensitive--has now taken 10d of bactrim. I told him to go ahead and stop this med now, but I do not think he has an allergy to this med (or to indocin). He has paperwork to fill out in order to get scheduled to see ENT at [facility].

## 2011-08-26 ENCOUNTER — Other Ambulatory Visit: Payer: Self-pay | Admitting: *Deleted

## 2011-09-09 ENCOUNTER — Ambulatory Visit: Payer: BC Managed Care – PPO | Admitting: Family Medicine

## 2011-09-12 ENCOUNTER — Encounter: Payer: Self-pay | Admitting: Family Medicine

## 2011-09-12 ENCOUNTER — Ambulatory Visit (INDEPENDENT_AMBULATORY_CARE_PROVIDER_SITE_OTHER): Payer: BC Managed Care – PPO | Admitting: Family Medicine

## 2011-09-12 VITALS — BP 111/76 | HR 85 | Ht 68.0 in | Wt 189.0 lb

## 2011-09-12 DIAGNOSIS — M109 Gout, unspecified: Secondary | ICD-10-CM

## 2011-09-12 DIAGNOSIS — H7290 Unspecified perforation of tympanic membrane, unspecified ear: Secondary | ICD-10-CM

## 2011-09-12 DIAGNOSIS — H6610 Chronic tubotympanic suppurative otitis media, unspecified: Secondary | ICD-10-CM

## 2011-09-12 DIAGNOSIS — E538 Deficiency of other specified B group vitamins: Secondary | ICD-10-CM

## 2011-09-12 DIAGNOSIS — R5381 Other malaise: Secondary | ICD-10-CM

## 2011-09-12 DIAGNOSIS — R5383 Other fatigue: Secondary | ICD-10-CM

## 2011-09-12 MED ORDER — SULFAMETHOXAZOLE-TMP DS 800-160 MG PO TABS: 1.0000 | ORAL_TABLET | Freq: Two times a day (BID) | ORAL | Status: AC

## 2011-09-12 MED ORDER — CYANOCOBALAMIN 1000 MCG/ML IJ SOLN
1000.0000 ug | Freq: Once | INTRAMUSCULAR | Status: AC
  Administered 2011-09-12: 1000 ug via INTRAMUSCULAR

## 2011-09-12 MED ORDER — "SYRINGE 25G X 1"" 3 ML MISC": Status: DC

## 2011-09-12 MED ORDER — CYANOCOBALAMIN 1000 MCG/ML IJ SOLN: 1000.0000 ug | Freq: Once | INTRAMUSCULAR | Status: DC

## 2011-09-12 MED ORDER — ALLOPURINOL 300 MG PO TABS: 300.0000 mg | ORAL_TABLET | Freq: Every day | ORAL | Status: DC

## 2011-09-12 MED ORDER — CYANOCOBALAMIN 1000 MCG/ML IJ SOLN: INTRAMUSCULAR | Status: DC

## 2011-09-12 MED ORDER — PREDNISONE 20 MG PO TABS: ORAL_TABLET | ORAL | Status: DC

## 2011-09-12 NOTE — Assessment & Plan Note (Signed)
Multifactorial: hypogonadism, vit B12 def, depression. He declined trial of antidepressant again today. We started vit b12 "ramp up" replacement: 1000 mcg qd x 7d, then 1000 mcg q week x 4 wks, then 1000 mcg q month lifetime. CMA did teaching on home injection for his wife today, vit B12 and needles/syringes rx'd. We'll recheck vit B12 level at f/u in 1 mo.  He wiill also start the fortesta that I rx'd for him recently.  We'll recheck testosterone level at 1 mo f/u if his appt here is appropriately timed.

## 2011-09-12 NOTE — Progress Notes (Signed)
OFFICE VISIT  09/12/2011   CC:  Chief Complaint  Patient presents with  . Follow-up    multiple issues, discuss preventative for gout, begin B12 injections     HPI:    Patient is a 61 y.o. Caucasian male who presents for 3 wk f/u left ear problems, fatigue, hypogonadism,vit B12 def. He is ready to start treatment with vit B12.  No sx's other than fatigue. He is agreeable to restart of testosterone replacement--he has a fortesta rx I gave him at a recent visit.  He has been recently having some trouble with his joints--describes some periarticular swelling in left lateral elbow region, redness and tenderness in the area, extended into soft tissues of forearm fairly prominently per pt and wife's report.  Did not get better with indomethacin at home so went to ED in middle of the night.  X-ray was unremarkable.  Presumed dx gouty arthritis/tendon synovitis and he was given prednisone x 5d and this has now resolved.  However, last night he had abrupt onset of swelling, redness, and tenderness in the anterior knee towards the bottom region of the patella and over the patellar tendon region.   Describes hx of lateral ankle swelling and tenderness w/out trigger in the past.  Also says he had what sounds like podagra about 6 yrs ago shortly after getting heart surgery.  Uric acid level at ED and here have been wnl.  He has never been on gout prophylactic med.  Past Medical History  Diagnosis Date  . Prolapse of mitral valve     s/p repair  . Mitral valve regurgitation     s/p repair  . Obesity   . Atrial fibrillation     postoperative  . Anxiety   . BPH (benign prostatic hypertrophy)   . Urge incontinence   . Hypogonadism male 08/2010    Axiron started--?compliance? (repeat value 01/2011 mildly improved).    Past Surgical History  Procedure Date  . Mitral valve repair 2007    Dr. Cornelius Moras  . Colonoscopy 2003    Normal per pt: Dr. Gabriel Cirri in [place]  . Hemorrhoid surgery 2000    doing fine  since    Outpatient Prescriptions Prior to Visit  Medication Sig Dispense Refill  . aspirin 81 MG tablet Take 81 mg by mouth daily.        . metoprolol succinate (TOPROL-XL) 25 MG 24 hr tablet Take 0.5 tablets (12.5 mg total) by mouth daily.  15 tablet  12  . buPROPion (WELLBUTRIN XL) 150 MG 24 hr tablet Take 1 tablet (150 mg total) by mouth daily.  30 tablet  1  . clonazePAM (KLONOPIN) 1 MG tablet 1/2 -1 tab po qhs prn restless legs syndrome  30 tablet  1  . diphenhydrAMINE (BENADRYL) 25 mg capsule Take 25 mg by mouth every 6 (six) hours as needed.      . Testosterone (FORTESTA) 10 MG/ACT (2%) GEL Place 40 mg onto the skin daily.  60 g  5  . fluconazole (DIFLUCAN) 150 MG tablet 1 tab po qd x 5d  5 tablet  0  . sulfamethoxazole-trimethoprim (BACTRIM DS) 800-160 MG per tablet 1 tab po bid x 14 days  28 tablet  0   No facility-administered medications prior to visit.    No Known Allergies  ROS As per HPI  PE: Blood pressure 111/76, pulse 85, height 5\' 8"  (1.727 m), weight 189 lb (85.73 kg). Gen: Alert, tired appearing but NAD.  Patient is oriented to  person, place, time, and situation. Elbows and forearms and ankles are without erythema, swelling, warmth, or tenderness or loss of ROM. Right knee without erythema, warmth, tenderness, or swelling.  Left knee has focal TTP and mild erythema anteriorly in the lower 1/3 of the prepatellar region and mild soft tissue fullness in patellar tendon region, question mild prepatellar bursa swelling.  Remainder of knee is nontender, without swelling, and without warmth.  LABS:  None today  PROCEDURE: left knee prepatellar bursa aspiration: under sterile conditions I attempted to aspirate fluid from the region of the inferior portion of the prepatellar bursa (22 gauge needle, 5cc syringe) but with 2 separate attempts was unable to aspirate any fluid at all.  No immediate complications.  Pt tolerated procedure well.  IMPRESSION AND  PLAN:  Gout This is his best working dx at this time, but most of his "flares" seem to be periarticular/bursa/tendon synovitis rather than arthritis. I made an unsuccessful attempt to get fluid from the left prepatellar bursa today in the office. Will give 40mg  prednisone qd x 5d. Will start allopurinol 300mg  qd. F/u in 1 mo and recheck uric acid level. Needs to come in and let me see next time he has a joint flare up so I can see exactly where the inflammation is and see if I can get a fluid sample.  Fatigue Multifactorial: hypogonadism, vit B12 def, depression. He declined trial of antidepressant again today. We started vit b12 "ramp up" replacement: 1000 mcg qd x 7d, then 1000 mcg q week x 4 wks, then 1000 mcg q month lifetime. CMA did teaching on home injection for his wife today, vit B12 and needles/syringes rx'd. We'll recheck vit B12 level at f/u in 1 mo.  He wiill also start the fortesta that I rx'd for him recently.  We'll recheck testosterone level at 1 mo f/u if his appt here is appropriately timed.  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  Culture of drainage in recent past showed MSSA and he says each time I rx oral antibiotic it gets better. I recommended antibiotic ear drops but pt declines them, says he can't pay for them.   He has appt with ENT at [facility] on 09/20/11 to see if there is anything they can offer surgically for his left TM.   FOLLOW UP: Return in about 1 month (around 10/13/2011) for f/u gout and hypogonadism; mid morning appt if possible.

## 2011-09-12 NOTE — Assessment & Plan Note (Signed)
This is his best working dx at this time, but most of his "flares" seem to be periarticular/bursa/tendon synovitis rather than arthritis. I made an unsuccessful attempt to get fluid from the left prepatellar bursa today in the office. Will give 40mg  prednisone qd x 5d. Will start allopurinol 300mg  qd. F/u in 1 mo and recheck uric acid level. Needs to come in and let me see next time he has a joint flare up so I can see exactly where the inflammation is and see if I can get a fluid sample.

## 2011-09-12 NOTE — Assessment & Plan Note (Addendum)
With restart of excessive drainage in left ear today + mild TM erythema I'll start bactrim DS 1 tab bid x 10d.  Culture of drainage in recent past showed MSSA and he says each time I rx oral antibiotic it gets better. I recommended antibiotic ear drops but pt declines them, says he can't pay for them.   He has appt with ENT at [facility] on 09/20/11 to see if there is anything they can offer surgically for his left TM.

## 2011-09-19 ENCOUNTER — Other Ambulatory Visit: Payer: Self-pay | Admitting: Family Medicine

## 2011-09-19 ENCOUNTER — Telehealth: Payer: Self-pay | Admitting: Cardiology

## 2011-09-19 NOTE — Telephone Encounter (Signed)
Patient feels he needs to be seen this week sometime for his blood pressure going up on Saturday.  Did offer appointment tomorrow morning but he already has an appointment with ENT at [facility] that he has been waiting for a long time for.  Will forward to [facility] RN to see if anywhere he might be able to be worked in.  Patient does live in [place] and would be willing to go to that office. Left message on patients machine

## 2011-09-19 NOTE — Telephone Encounter (Signed)
Please return call to patient hm#   Pt c/o Elevated BP, nightsweats,  not feeling good

## 2011-09-20 MED ORDER — TESTOSTERONE 10 MG/ACT (2%) TD GEL: 40.0000 mg | Freq: Every day | TRANSDERMAL | Status: DC

## 2011-09-20 NOTE — Telephone Encounter (Signed)
Pt states Saturday his  BP was 121/100 he became hot and sweaty, he turned red and didn't "feel right".  He did not have any chest pain or SOB.  Now he just doesn't "feel right" and would like to be seen.  He reports that EMS checked him out but he would prefer Dr Antoine Poche see him.  He has had no s/s since other than just no feeling "right".  Appointment given for tomorrow in [place] at [place]

## 2011-09-20 NOTE — Telephone Encounter (Signed)
Pt lost previous RX.  RX reprinted.  Message left RX is ready.

## 2011-09-21 ENCOUNTER — Encounter: Payer: Self-pay | Admitting: Cardiology

## 2011-09-21 ENCOUNTER — Ambulatory Visit (INDEPENDENT_AMBULATORY_CARE_PROVIDER_SITE_OTHER): Payer: BC Managed Care – PPO | Admitting: Cardiology

## 2011-09-21 VITALS — BP 115/80 | HR 87 | Ht 68.0 in | Wt 189.0 lb

## 2011-09-21 DIAGNOSIS — I10 Essential (primary) hypertension: Secondary | ICD-10-CM

## 2011-09-21 DIAGNOSIS — Z9889 Other specified postprocedural states: Secondary | ICD-10-CM

## 2011-09-21 DIAGNOSIS — I080 Rheumatic disorders of both mitral and aortic valves: Secondary | ICD-10-CM

## 2011-09-21 NOTE — Progress Notes (Signed)
HPI The patient presents because of an episode of hypertension that happened this weekend. He was working in the yard like he usually does when all of a sudden he became very flushed in his face. He felt uncomfortable but not with chest discomfort. He was sweating more than he thought he should. He did call EMS and he thinks his blood pressure was recorded at 145/100. He was given aspirin but did not want to be transported to the hospital. His symptoms eventually resolved. He since has had no further episodes. He's not had this happen before. He otherwise has been doing well. He's not been having any new shortness of breath or chest discomfort. He denies any palpitations, presyncope or syncope.  No Known Allergies  Current Outpatient Prescriptions  Medication Sig Dispense Refill  . allopurinol (ZYLOPRIM) 300 MG tablet Take 1 tablet (300 mg total) by mouth daily.  30 tablet  6  . aspirin 81 MG tablet Take 81 mg by mouth daily.        . cyanocobalamin (,VITAMIN B-12,) 1000 MCG/ML injection 1 ml IM qd x 6d, then 1 ml IM q week x 4 weeks, then 1 ml IM q month  30 mL  0  . metoprolol succinate (TOPROL-XL) 25 MG 24 hr tablet Take 0.5 tablets (12.5 mg total) by mouth daily.  15 tablet  12  . sulfamethoxazole-trimethoprim (BACTRIM DS) 800-160 MG per tablet Take 1 tablet by mouth 2 (two) times daily.  20 tablet  0  . Syringe/Needle, Disp, (SYRINGE 3CC/25GX1") 25G X 1" 3 ML MISC Use as directed for B12 injections  20 each  1  . Testosterone (FORTESTA) 10 MG/ACT (2%) GEL Place 40 mg onto the skin daily.  60 g  5    Past Medical History  Diagnosis Date  . Prolapse of mitral valve     s/p repair  . Mitral valve regurgitation     s/p repair  . Obesity   . Atrial fibrillation     postoperative  . Anxiety   . BPH (benign prostatic hypertrophy)   . Urge incontinence   . Hypogonadism male 08/2010    Axiron started--?compliance? (repeat value 01/2011 mildly improved).    Past Surgical History    Procedure Date  . Mitral valve repair 2007    Dr. Cornelius Moras  . Colonoscopy 2003    Normal per pt: Dr. Gabriel Cirri in [place]  . Hemorrhoid surgery 2000    doing fine since    ROS:  As stated in the HPI and negative for all other systems.  PHYSICAL EXAM BP 115/80  Pulse 87  Ht 5\' 8"  (1.727 m)  Wt 189 lb (85.73 kg)  BMI 28.74 kg/m2 GENERAL:  Well appearing NECK:  No jugular venous distention, waveform within normal limits, carotid upstroke brisk and symmetric, no bruits, no thyromegaly LUNGS:  Clear to auscultation bilaterally BACK:  No CVA tenderness CHEST:  Well healed surgical scar HEART:  PMI not displaced or sustained,S1 and S2 within normal limits, no S3, no S4, no clicks, no rubs, no murmurs ABD:  Flat, positive bowel sounds normal in frequency in pitch, no bruits, no rebound, no guarding, no midline pulsatile mass, no hepatomegaly, no splenomegaly EXT:  2 plus pulses throughout, no edema, no cyanosis no clubbing  ASSESSMENT AND PLAN   MITRAL REGURGITATION -  At this point he needs no further imaging. He has no symptoms or change in his physical exam. I will follow him up in one year.  HTN -  At this point I am not exactly clear what this episode was. At this point I would suggest taking an extra dose of metoprolol if this happens again. Further testing is not indicated at this point. His wife asked if this could have been a panic attack and certainly could have.

## 2011-09-21 NOTE — Patient Instructions (Addendum)
The current medical regimen is effective;  continue present plan and medications.  Follow up in January 2014

## 2011-09-26 ENCOUNTER — Encounter: Payer: Self-pay | Admitting: Family Medicine

## 2011-10-10 ENCOUNTER — Encounter: Payer: Self-pay | Admitting: Family Medicine

## 2011-10-10 ENCOUNTER — Telehealth: Payer: Self-pay | Admitting: Family Medicine

## 2011-10-10 ENCOUNTER — Ambulatory Visit (INDEPENDENT_AMBULATORY_CARE_PROVIDER_SITE_OTHER): Payer: BC Managed Care – PPO | Admitting: Family Medicine

## 2011-10-10 VITALS — BP 110/74 | HR 87 | Temp 98.4°F | Ht 68.0 in | Wt 185.0 lb

## 2011-10-10 DIAGNOSIS — M791 Myalgia, unspecified site: Secondary | ICD-10-CM

## 2011-10-10 DIAGNOSIS — IMO0001 Reserved for inherently not codable concepts without codable children: Secondary | ICD-10-CM

## 2011-10-10 DIAGNOSIS — R509 Fever, unspecified: Secondary | ICD-10-CM

## 2011-10-10 LAB — COMPREHENSIVE METABOLIC PANEL
ALT: 18 U/L (ref 0–53)
AST: 18 U/L (ref 0–37)
CO2: 25 mEq/L (ref 19–32)
Calcium: 8.5 mg/dL (ref 8.4–10.5)
Chloride: 104 mEq/L (ref 96–112)
Potassium: 3.9 mEq/L (ref 3.5–5.3)
Sodium: 137 mEq/L (ref 135–145)
Total Protein: 6.3 g/dL (ref 6.0–8.3)

## 2011-10-10 LAB — SEDIMENTATION RATE: Sed Rate: 7 mm/hr (ref 0–16)

## 2011-10-10 LAB — CBC WITH DIFFERENTIAL/PLATELET
Eosinophils Relative: 3 % (ref 0–5)
Lymphocytes Relative: 28 % (ref 12–46)
Lymphs Abs: 1.1 10*3/uL (ref 0.7–4.0)
MCV: 82 fL (ref 78.0–100.0)
Neutro Abs: 2.1 10*3/uL (ref 1.7–7.7)
Neutrophils Relative %: 52 % (ref 43–77)
Platelets: 155 10*3/uL (ref 150–400)
RBC: 5.34 MIL/uL (ref 4.22–5.81)
WBC: 4.1 10*3/uL (ref 4.0–10.5)

## 2011-10-10 MED ORDER — DOXYCYCLINE HYCLATE 100 MG PO CAPS: ORAL_CAPSULE | ORAL | Status: DC

## 2011-10-10 NOTE — Assessment & Plan Note (Addendum)
With prominent fatigue and myalgias diffusely.  I agree that viral syndrome is the most likely dx. Reviewed all of [facility] ED records from 10/08/11--all unremarkable (CBC, CMET, UA, EKG, and CXR). Will treat empirically for tick-borne illness: doxycycline 100mg  bid x 7d. For completeness, will repeat CBC and CMET today, as well as checking an ESR, TSH, and monospot test. Continue ibuprofen/tylenol for pain/fever. Emphasized the importance of fluids, rest.

## 2011-10-10 NOTE — Progress Notes (Signed)
OFFICE NOTE  10/10/2011  CC:  Chief Complaint  Patient presents with  . acute    fatigue, fever, seen in [place] ER on Saturday, still having body aches and fatigue, sweaty/chills     HPI: Patient is a 61 y.o. Caucasian male who is here for feeling feverish over the weekend. Onset of fatigue 4 d/a, body hurting all over, subjective fever/chills.  No appetite but no n/v.  He had a bit of diarrhea a couple days ago.  No ST, no HA, no rash.  No known sick contacts.  Says chest feels fine, breathing feels fine. No ear aches.   Went to [facility] ED and says temp was 100.3 there.  Says urine, blood, EKG, and CXR were checked and they all were normal per his report. Yesterday he was feeling a bit better so he got on his riding [employer] and mowed his yard.  He then got worse again last evening after doing this.  Pertinent PMH:  Past Medical History  Diagnosis Date  . Prolapse of mitral valve     s/p repair  . Mitral valve regurgitation     s/p repair  . Obesity   . Atrial fibrillation     postoperative  . Anxiety   . BPH (benign prostatic hypertrophy)   . Urge incontinence   . Hypogonadism male 08/2010    Axiron started--?compliance? (repeat value 01/2011 mildly improved).  . Chronic otitis media     left, with chronic TM perforation; also with bilateral conductive hearing loss (sees Dr. Joelene Millin at [facility] ENT)   Past surgical, social, and family history reviewed and no changes noted since last office visit.  MEDS:  Outpatient Prescriptions Prior to Visit  Medication Sig Dispense Refill  . allopurinol (ZYLOPRIM) 300 MG tablet Take 1 tablet (300 mg total) by mouth daily.  30 tablet  6  . aspirin 81 MG tablet Take 81 mg by mouth daily.        . cyanocobalamin (,VITAMIN B-12,) 1000 MCG/ML injection 1 ml IM qd x 6d, then 1 ml IM q week x 4 weeks, then 1 ml IM q month  30 mL  0  . metoprolol succinate (TOPROL-XL) 25 MG 24 hr tablet Take 0.5 tablets (12.5 mg total) by mouth  daily.  15 tablet  12  . Syringe/Needle, Disp, (SYRINGE 3CC/25GX1") 25G X 1" 3 ML MISC Use as directed for B12 injections  20 each  1  . Testosterone (FORTESTA) 10 MG/ACT (2%) GEL Place 40 mg onto the skin daily.  60 g  5    PE: Blood pressure 110/74, pulse 87, temperature 98.4 F (36.9 C), temperature source Temporal, height 5\' 8"  (1.727 m), weight 185 lb (83.915 kg), SpO2 97.00%. Gen: alert, tired appearing but in NAD. ENT:  Eyes: no injection, icteris, swelling, or exudate.  EOMI, PERRLA. Nose: no drainage or turbinate edema/swelling.  No injection or focal lesion.  Mouth: lips without lesion/swelling.  Oral mucosa pink and moist.  Dentition intact and without obvious caries or gingival swelling.  Oropharynx without erythema, exudate, or swelling.  Neck - No masses or thyromegaly or limitation in range of motion CV: RRR, no m/r/g.   LUNGS: CTA bilat, nonlabored resps, good aeration in all lung fields. ABD: soft, NT, ND, BS normal.  No hepatospenomegaly or mass.  No bruits. EXT: no clubbing, cyanosis, or edema.  MUSC: no muscle tenderness to palpation. Neuro: no focal neuro deficit. SKin: no rash, pallor, or jaundice  Labs: none today  IMPRESSION AND PLAN:  Febrile illness With prominent fatigue and myalgias diffusely.  I agree that viral syndrome is the most likely dx. Reviewed all of [facility] hosp ED records from 10/08/11--all unremarkable (CBC, CMET, UA, EKG, and CXR). Will treat empirically for tick-borne illness: doxycycline 100mg  bid x 7d. For completeness, will repeat CBC and CMET today, as well as checking an ESR, TSH, and monospot test. Continue ibuprofen/tylenol for pain/fever. Emphasized the importance of fluids, rest.  May hold allopurinol, ASA, and metoprolol until he is feeling well again.   FOLLOW UP: has appt set for 3d already.

## 2011-10-10 NOTE — Telephone Encounter (Signed)
Pt has appt today

## 2011-10-10 NOTE — Telephone Encounter (Signed)
Patient wants to talk to Judeth Cornfield, he went to the ER Friday 10/07/11 and they advised him that he has a viral infection, patient states he doesn't feel any better this morning & he is burning up with fever.

## 2011-10-13 ENCOUNTER — Ambulatory Visit: Payer: BC Managed Care – PPO | Admitting: Family Medicine

## 2011-10-21 ENCOUNTER — Telehealth: Payer: Self-pay | Admitting: Family Medicine

## 2011-10-21 NOTE — Telephone Encounter (Signed)
Please confirm that he is taking his allopurinol and also ask specifically what joint or joints are giving him problems.  Tell him to describe what he feels and what the joint looks like.-thx

## 2011-10-21 NOTE — Telephone Encounter (Signed)
Please advise 

## 2011-10-21 NOTE — Telephone Encounter (Signed)
Left a detailed message and asked pt to return my call

## 2011-10-22 ENCOUNTER — Encounter: Payer: Self-pay | Admitting: Family Medicine

## 2011-10-22 ENCOUNTER — Ambulatory Visit (INDEPENDENT_AMBULATORY_CARE_PROVIDER_SITE_OTHER): Payer: BC Managed Care – PPO | Admitting: Family Medicine

## 2011-10-22 ENCOUNTER — Ambulatory Visit: Payer: BC Managed Care – PPO | Admitting: Family Medicine

## 2011-10-22 VITALS — BP 106/74 | Temp 98.6°F | Wt 188.0 lb

## 2011-10-22 DIAGNOSIS — H43399 Other vitreous opacities, unspecified eye: Secondary | ICD-10-CM

## 2011-10-22 DIAGNOSIS — M79609 Pain in unspecified limb: Secondary | ICD-10-CM

## 2011-10-22 DIAGNOSIS — H43391 Other vitreous opacities, right eye: Secondary | ICD-10-CM

## 2011-10-22 DIAGNOSIS — M79669 Pain in unspecified lower leg: Secondary | ICD-10-CM

## 2011-10-22 DIAGNOSIS — M79676 Pain in unspecified toe(s): Secondary | ICD-10-CM

## 2011-10-22 NOTE — Progress Notes (Signed)
Subjective:    Patient ID: [name], male    DOB: [birth year], 61 y.o.   MRN: [id number]  HPI 2 days ago noticed a web in his vision.  Says will see a bright light in the periphery esp when dark outside.  Not estab with an eye doc.  No trauma or eye or face. Never happened before. Says will see a flash of light.    Was having left toe and knee pain. Had had pain in the elbow as well. He is on allopurinol for possible gout. Woke up with great pain in his toe. Went to work anyway.  Took IBU late last night.  Toe woke him up at [facility].  Took some more IBU.  This am pain is beter but now more pain in the calf.  Toe is still a little swollen yesterday.  Put warm washcloth on it too.    Review of Systems BP 106/74  Temp 98.6 F (37 C) (Oral)  Wt 188 lb (85.276 kg)    No Known Allergies  Past Medical History  Diagnosis Date  . Prolapse of mitral valve     s/p repair  . Mitral valve regurgitation     s/p repair  . Obesity   . Atrial fibrillation     postoperative  . Anxiety   . BPH (benign prostatic hypertrophy)   . Urge incontinence   . Hypogonadism male 08/2010    Axiron started--?compliance? (repeat value 01/2011 mildly improved).  . Chronic otitis media     left, with chronic TM perforation; also with bilateral conductive hearing loss (sees Dr. Joelene Millin at [facility])    Past Surgical History  Procedure Date  . Mitral valve repair 2007    Dr. Cornelius Moras  . Colonoscopy 2003    Normal per pt: Dr. Gabriel Cirri in [place]  . Hemorrhoid surgery 2000    doing fine since    History   Social History  . Marital Status: Married    Spouse Name: N/A    Number of Children: N/A  . Years of Education: N/A   Occupational History  . 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 Smoking status: Never Smoker   . Smokeless tobacco: Never Used  . Alcohol Use: No  . Drug Use: No  . Sexually Active: Not on file   Other Topics Concern  . Not on file   Social History  Narrative   Married 40 yrs, wife left him last week.Truck driver, delivers propane.Has 3 grown children.    Family History  Problem Relation Age of Onset  . Alcohol abuse Mother   . Alcohol abuse Father   . Cancer Father     prostate    Outpatient Encounter Prescriptions as of 10/22/2011  Medication Sig Dispense Refill  . allopurinol (ZYLOPRIM) 300 MG tablet Take 1 tablet (300 mg total) by mouth daily.  30 tablet  6  . aspirin 81 MG tablet Take 81 mg by mouth daily.        . cyanocobalamin (,VITAMIN B-12,) 1000 MCG/ML injection 1 ml IM qd x 6d, then 1 ml IM q week x 4 weeks, then 1 ml IM q month  30 mL  0  . metoprolol succinate (TOPROL-XL) 25 MG 24 hr tablet Take 0.5 tablets (12.5 mg total) by mouth daily.  15 tablet  12  . Syringe/Needle, Disp, (SYRINGE 3CC/25GX1") 25G X 1" 3 ML MISC Use as directed for B12  injections  20 each  1  . Testosterone (FORTESTA) 10 MG/ACT (2%) GEL Place 40 mg onto the skin daily.  60 g  5  . DISCONTD: doxycycline (VIBRAMYCIN) 100 MG capsule 1 tab po bid x 7d  14 capsule  0          Objective:   Physical Exam  Constitutional: He is oriented to person, place, and time. He appears well-developed and well-nourished.  HENT:  Head: Normocephalic and atraumatic.       EOMi, PEERLA, Able to visualize vessels on exam. No hemorrhage.    Musculoskeletal:       None tender on calf squeeze.  Great toe on left foot is mildly tender laterally. Trace swelling of great toe, no sig erythema. No breaks in skin. Normal flexion and dorsiflexion of the ankle.  No pain in the calf with dorsiflexion  Neurological: He is alert and oriented to person, place, and time.          Assessment & Plan:  Eye Floaters - Discussed needs prompt evaluation. Called Dr. Robinette Haines office. No Sat office hours.  Needs to be seen on Monday. Stressed the importance of this to him. He agrees to call Monday morning. If any change in vision or spot becomes larger go to ED immediately and they  will call on call ophtho.  Gave him H.O.  Discussed potential for vison loss if not evaluated.   He is not diabetic.   Great toe pain-  I do think this is gout based on his description of severe pain.  Seems to have subsided or at lest improved sig with 2 doses of IBU. Continue IBU 619m Q6 hours over the weekend.   Calf pain - i think likely strain from walking akwardly on the outside of the foot bc of great toe pain. Exam is reassuring.  Warned him if inc in pain, swelling then go to ED for further eval.  Thought DVt is very low on differential at this point in time.

## 2011-10-22 NOTE — Patient Instructions (Addendum)
Call Dr. Randon Goldsmith office On Monday, [phone] for same day eye appointment' Continue your Ibuprofen 600mg  every 6 hours for the next couple of days If leg starts to swell or gets worse of is notice larger part of vision is effected then go to ED immediately.     Eye Floaters A jelly-like fluid fills the inside of the eye and is called the vitreous. The vitreous is normally clear. It allows light to pass through to the back of the eye to the tissues that contain the nerves needed for vision (the retina). With age, the vitreous can start to decline. If a decline happens, specks of material from clumps of cells, blood, or other materials may start to float around inside the eye. These objects cast shadows on the retina. These shadows are seen as moving strings, streaks, "bugs," dust or spider webs floating in front of the eye. CAUSES    Age.   A high degree of near-sightedness (high myopia).   Tears in the retina.   Bleeding inside the eye from broken retinal blood vessels as a result of disease (diabetes, inflammation of the retinal blood vessels, and others).   Blood clot of the major vein of the retina or its branches (retinal vein occlusion).   Trauma.   Retinal detachment.   Vitreous detachment.   Eye surgery.   Inflammation inside the eye (uveitis).   Infection inside the eye.  SYMPTOMS    Seeing floating specs, dots or spider webs in the vision of one eye. This can sometimes be associated with flashes of light seen off to the side.   Bleeding in the eye may begin as floaters and lead to complete vision loss as the vitreous fills with blood. This may happen repeatedly in certain diseases of the blood vessels of the retina (e.g. diabetes).   If the vitreous shrinks enough to pull away from the retina (posterior vitreous detachment), a small circular ring-shaped floater may be seen.  Migraine headaches may be associated with many forms of visual symptoms (sparkling dots,  wavy lines) just before the headache strikes. These symptoms due to migraine are not from floaters. They will disappear when the headache goes away. DIAGNOSIS   An eye professional can tell you if you have floaters during an eye exam. TREATMENT   There is no treatment for the floaters themselves.  If the floaters are due to a tear in the retina, a retinal detachment or other eye disease, the condition that caused the floaters must be treated.   Floaters due to blood in the eye often go away or lessen with time.  SEEK MEDICAL CARE IF:    You suddenly see floating dots or spider webs in front of the vision of one or both eyes. This is especially true if you also see flashes of light off to the side (like flashes of lightening).   You see floaters and also notice a change or drop in your vision in either eye.  Document Released: 02/24/2003 Document Revised: 02/10/2011 Document Reviewed: 06/21/2007 [facility] Patient Information 2012 [place], [place].

## 2011-11-15 NOTE — Telephone Encounter (Signed)
Pt has not returned call.  Follow up ended.

## 2012-01-26 ENCOUNTER — Other Ambulatory Visit: Payer: Self-pay | Admitting: Family Medicine

## 2012-04-10 ENCOUNTER — Ambulatory Visit (INDEPENDENT_AMBULATORY_CARE_PROVIDER_SITE_OTHER): Payer: BC Managed Care – PPO | Admitting: Family Medicine

## 2012-04-10 ENCOUNTER — Encounter: Payer: Self-pay | Admitting: Family Medicine

## 2012-04-10 VITALS — BP 111/77 | HR 75 | Wt 189.0 lb

## 2012-04-10 DIAGNOSIS — M7989 Other specified soft tissue disorders: Secondary | ICD-10-CM | POA: Insufficient documentation

## 2012-04-10 DIAGNOSIS — E538 Deficiency of other specified B group vitamins: Secondary | ICD-10-CM

## 2012-04-10 LAB — D-DIMER, QUANTITATIVE: D-Dimer, Quant: 0.32 ug/mL-FEU (ref 0.00–0.48)

## 2012-04-10 MED ORDER — CYANOCOBALAMIN 1000 MCG/ML IJ SOLN
1000.0000 ug | Freq: Once | INTRAMUSCULAR | Status: AC
  Administered 2012-04-10: 1000 ug via INTRAMUSCULAR

## 2012-04-10 NOTE — Progress Notes (Signed)
OFFICE NOTE  04/10/2012  CC:  Chief Complaint  Patient presents with  . Joint Swelling    left knee; swelling on inside of knee, not painful     HPI: Patient is a 62 y.o. Caucasian male who is here for a knot beside his left knee. He first noticed it 2 d/a. Denies pain in the area, no recent trauma or knee injury.  "I don't really know how long it's been there".  Says it was more prominent yesterday.  No recent surgical procedures, no recent prolonged immobilization, no hx of DVT or PE. His wife is worried he may have a blood clot so she made him come in.  Pertinent PMH:  Past Medical History  Diagnosis Date  . Prolapse of mitral valve     s/p repair  . Mitral valve regurgitation     s/p repair  . Obesity   . Atrial fibrillation     postoperative  . Anxiety   . BPH (benign prostatic hypertrophy)   . Urge incontinence   . Hypogonadism male 08/2010    Axiron started--?compliance? (repeat value 01/2011 mildly improved).  . Chronic otitis media     left, with chronic TM perforation; also with bilateral conductive hearing loss (sees Dr. Joelene Millin at [facility])   Past surgical, social, and family history reviewed and no changes noted since last office visit.  MEDS:  Outpatient Prescriptions Prior to Visit  Medication Sig Dispense Refill  . aspirin 81 MG tablet Take 81 mg by mouth daily.        . metoprolol succinate (TOPROL-XL) 25 MG 24 hr tablet Take 0.5 tablets (12.5 mg total) by mouth daily.  15 tablet  12  . allopurinol (ZYLOPRIM) 300 MG tablet Take 1 tablet (300 mg total) by mouth daily.  30 tablet  6  . cyanocobalamin (,VITAMIN B-12,) 1000 MCG/ML injection INJECT 1 ML DAILY FOR 6DAYS, THEN 1 ML EVERY WEEK, THEN EVERY MONTH  1 mL  6  . Syringe/Needle, Disp, (SYRINGE 3CC/25GX1") 25G X 1" 3 ML MISC Use as directed for B12 injections  20 each  1  . Testosterone (FORTESTA) 10 MG/ACT (2%) GEL Place 40 mg onto the skin daily.  60 g  5   Last reviewed on 04/10/2012 11:01 AM by  Jeoffrey Massed, MD  PE: Blood pressure 111/77, pulse 75, weight 189 lb (85.73 kg). Gen: Alert, well appearing.  Patient is oriented to person, place, time, and situation. Knees without effusion, warmth, or appreciable soft tissue swelling or mass on inspection. Palpation reveals a subtle region of tissue firmness in the distal-most aspect of the medial quad muscle on the left leg.  This region has a varicosity but not a palpable one and the region is nontender.  No edema of LE's, no tenderness in calves or quads.  He has a dimple-like scar about 1 inch proximal to the area in question today--he states this is where he had a rod puncture entirely through his leg as a child.  IMPRESSION AND PLAN:  Leg swelling Focal; feels like scar tissue assoc with his distant puncture injury to the area proximal to it. Low suspicion of DVT.  Will go ahead and check stat d-dimer today.   HTN: stable.  An After Visit Summary was printed and given to the patient.  FOLLOW UP:  To be determined based on lab testing today.

## 2012-04-10 NOTE — Assessment & Plan Note (Signed)
Focal; feels like scar tissue assoc with his distant puncture injury to the area proximal to it. Low suspicion of DVT.  Will go ahead and check stat d-dimer today.

## 2012-04-30 ENCOUNTER — Ambulatory Visit (INDEPENDENT_AMBULATORY_CARE_PROVIDER_SITE_OTHER): Payer: BC Managed Care – PPO | Admitting: Cardiology

## 2012-04-30 ENCOUNTER — Encounter: Payer: Self-pay | Admitting: Cardiology

## 2012-04-30 VITALS — BP 118/80 | HR 77 | Ht 67.0 in | Wt 191.0 lb

## 2012-04-30 DIAGNOSIS — Z9889 Other specified postprocedural states: Secondary | ICD-10-CM

## 2012-04-30 DIAGNOSIS — I10 Essential (primary) hypertension: Secondary | ICD-10-CM

## 2012-04-30 DIAGNOSIS — I340 Nonrheumatic mitral (valve) insufficiency: Secondary | ICD-10-CM

## 2012-04-30 HISTORY — DX: Nonrheumatic mitral (valve) insufficiency: I34.0

## 2012-04-30 NOTE — Progress Notes (Signed)
   HPI The patient presents for followup of mitral valve repair. Since I last saw him he has done well. He denies any chest pressure, neck or arm discomfort. He denies any palpitations, presyncope or syncope. He has no PND or orthopnea. He's had no weight gain or edema.  He still works which is an active and physical job. He's had no new symptoms related to this. At the last visit he had had an episode of hypertension but this is no longer a problem.  No Known Allergies  Current Outpatient Prescriptions  Medication Sig Dispense Refill  . aspirin 81 MG tablet Take 81 mg by mouth daily.        . cyanocobalamin (,VITAMIN B-12,) 1000 MCG/ML injection       . metoprolol succinate (TOPROL-XL) 25 MG 24 hr tablet Take 0.5 tablets (12.5 mg total) by mouth daily.  15 tablet  12  . Syringe/Needle, Disp, (SYRINGE 3CC/25GX1") 25G X 1" 3 ML MISC Use as directed for B12 injections  20 each  1   No current facility-administered medications for this visit.    Past Medical History  Diagnosis Date  . Prolapse of mitral valve     s/p repair  . Mitral valve regurgitation     s/p repair  . Obesity   . Atrial fibrillation     postoperative  . Anxiety   . BPH (benign prostatic hypertrophy)   . Urge incontinence   . Hypogonadism male 08/2010    Axiron started--?compliance? (repeat value 01/2011 mildly improved).  . Chronic otitis media     left, with chronic TM perforation; also with bilateral conductive hearing loss (sees Dr. Joelene Millin at [facility] ENT)    Past Surgical History  Procedure Laterality Date  . Mitral valve repair  2007    Dr. Cornelius Moras  . Colonoscopy  2003    Normal per pt: Dr. Gabriel Cirri in [place]  . Hemorrhoid surgery  2000    doing fine since    ROS:  As stated in the HPI and negative for all other systems.  PHYSICAL EXAM BP 118/80  Pulse 77  Ht 5\' 7"  (1.702 m)  Wt 191 lb (86.637 kg)  BMI 29.91 kg/m2 GENERAL:  Well appearing NECK:  No jugular venous distention, waveform within normal  limits, carotid upstroke brisk and symmetric, no bruits, no thyromegaly LUNGS:  Clear to auscultation bilaterally BACK:  No CVA tenderness CHEST:  Well healed surgical scar HEART:  PMI not displaced or sustained,S1 and S2 within normal limits, no S3, no S4, no clicks, no rubs, no murmurs ABD:  Flat, positive bowel sounds normal in frequency in pitch, no bruits, no rebound, no guarding, no midline pulsatile mass, no hepatomegaly, no splenomegaly EXT:  2 plus pulses throughout, no edema, no cyanosis no clubbing  ASSESSMENT AND PLAN   MITRAL REGURGITATION S/P REPAIR -  The patient has done well. However, has not been several years since his last echocardiogram. I will schedule followup to make sure of the stability of his repair.   HTN -  Blood pressures well controlled. He will continue the meds as listed.

## 2012-04-30 NOTE — Patient Instructions (Addendum)

## 2012-05-17 ENCOUNTER — Telehealth: Payer: Self-pay | Admitting: *Deleted

## 2012-05-17 NOTE — Telephone Encounter (Signed)
Unable to reach patient 05/09/12. Finally able to reach patient to schedule echo. Appointment 06/12/12 @ 4 pm per patient request.sl

## 2012-05-30 ENCOUNTER — Ambulatory Visit (INDEPENDENT_AMBULATORY_CARE_PROVIDER_SITE_OTHER): Payer: BC Managed Care – PPO | Admitting: Family Medicine

## 2012-05-30 DIAGNOSIS — E538 Deficiency of other specified B group vitamins: Secondary | ICD-10-CM

## 2012-05-30 MED ORDER — CYANOCOBALAMIN 1000 MCG/ML IJ SOLN
1000.0000 ug | Freq: Once | INTRAMUSCULAR | Status: AC
  Administered 2012-05-30: 1000 ug via INTRAMUSCULAR

## 2012-06-01 ENCOUNTER — Other Ambulatory Visit (INDEPENDENT_AMBULATORY_CARE_PROVIDER_SITE_OTHER): Payer: BC Managed Care – PPO

## 2012-06-01 DIAGNOSIS — Z0000 Encounter for general adult medical examination without abnormal findings: Secondary | ICD-10-CM

## 2012-06-01 DIAGNOSIS — R739 Hyperglycemia, unspecified: Secondary | ICD-10-CM

## 2012-06-01 DIAGNOSIS — R7309 Other abnormal glucose: Secondary | ICD-10-CM

## 2012-06-01 LAB — LIPID PANEL
HDL: 28.1 mg/dL — ABNORMAL LOW (ref >39.00)
Total CHOL/HDL Ratio: 8
VLDL: 21.6 mg/dL (ref 0.0–40.0)

## 2012-06-01 LAB — GLUCOSE, RANDOM: Glucose, Bld: 101 mg/dL — ABNORMAL HIGH (ref 70–99)

## 2012-06-04 LAB — LDL CHOLESTEROL, DIRECT: Direct LDL: 180.9 mg/dL

## 2012-06-06 ENCOUNTER — Telehealth: Payer: Self-pay | Admitting: Family Medicine

## 2012-06-06 NOTE — Telephone Encounter (Signed)
Pls notify pt that his cholesterol is high and I recommend a medication to get it down. If he is ok with this, I'll do eRx for atorvastatin 10mg  qd and he'll need to come in for f/u labs in 2 mo. Let me know-thx

## 2012-06-07 NOTE — Addendum Note (Signed)
Addended by: Baldemar Lenis R on: 06/07/2012 09:24 AM   Modules accepted: Orders

## 2012-06-12 ENCOUNTER — Ambulatory Visit (HOSPITAL_COMMUNITY): Payer: BC Managed Care – PPO | Attending: Cardiology

## 2012-06-12 DIAGNOSIS — I340 Nonrheumatic mitral (valve) insufficiency: Secondary | ICD-10-CM

## 2012-06-12 DIAGNOSIS — I059 Rheumatic mitral valve disease, unspecified: Secondary | ICD-10-CM

## 2012-06-12 DIAGNOSIS — Z09 Encounter for follow-up examination after completed treatment for conditions other than malignant neoplasm: Secondary | ICD-10-CM | POA: Insufficient documentation

## 2012-06-12 DIAGNOSIS — Z9889 Other specified postprocedural states: Secondary | ICD-10-CM

## 2012-06-12 NOTE — Progress Notes (Signed)
Echocardiogram performed.  

## 2012-06-18 ENCOUNTER — Other Ambulatory Visit: Payer: Self-pay | Admitting: Family Medicine

## 2012-06-18 MED ORDER — ATORVASTATIN CALCIUM 20 MG PO TABS: 20.0000 mg | ORAL_TABLET | Freq: Every day | ORAL | Status: DC

## 2012-06-18 NOTE — Progress Notes (Signed)
Med rx'd. Pls change next fasting labs to 73mo, not 2 wks.-thx

## 2012-06-18 NOTE — Telephone Encounter (Signed)
Rescheduled lab appointment for 08/01/12 8:00 am patient aware.

## 2012-06-25 ENCOUNTER — Telehealth: Payer: Self-pay | Admitting: Cardiology

## 2012-06-25 ENCOUNTER — Other Ambulatory Visit: Payer: BC Managed Care – PPO

## 2012-06-25 NOTE — Telephone Encounter (Signed)
Left message for pt to call back to discuss results of echo

## 2012-06-25 NOTE — Telephone Encounter (Signed)
Follow Up    Pt is calling in following up on his ECHO results.

## 2012-06-26 NOTE — Telephone Encounter (Signed)
Left message for pt to call back to discuss results. 

## 2012-06-27 ENCOUNTER — Encounter: Payer: Self-pay | Admitting: Family Medicine

## 2012-06-27 NOTE — Telephone Encounter (Signed)
Follow up ° ° ° ° °Returning nurses call from yesterday °

## 2012-06-27 NOTE — Telephone Encounter (Signed)
Spoke with pt who is aware of echo results.  He will follow up in one year as instructed.

## 2012-06-30 ENCOUNTER — Other Ambulatory Visit: Payer: Self-pay | Admitting: Cardiology

## 2012-07-02 NOTE — Telephone Encounter (Signed)
..   Requested Prescriptions   Pending Prescriptions Disp Refills  . metoprolol succinate (TOPROL-XL) 25 MG 24 hr tablet [Pharmacy Med Name: METOPROLOL SUCC ER 25 MG TAB] 15 tablet 7    Sig: TAKE 1/2 TABLET DAILY

## 2012-07-16 NOTE — Progress Notes (Signed)
Vit B12 in/nurse visit only.

## 2012-08-01 ENCOUNTER — Other Ambulatory Visit: Payer: BC Managed Care – PPO

## 2012-08-07 ENCOUNTER — Telehealth: Payer: Self-pay | Admitting: Nurse Practitioner

## 2012-08-07 NOTE — Telephone Encounter (Signed)
Please advise 

## 2012-08-08 ENCOUNTER — Ambulatory Visit: Payer: BC Managed Care – PPO | Admitting: Family Medicine

## 2012-08-08 NOTE — Telephone Encounter (Signed)
Patient stated while he was walking up a steep hill he felt pain in his knee and layed himself down on the ground.  He says he is fine when he is sitting or standing but when he starts to walk and pull his leg up it hurts.  Pain is about a 7 at that point.  He did not go in to work today and so I offered him an appt today at 3:45p.m.  He is going to try to get a ride but in the mean time he wants to know what he should do.

## 2012-08-08 NOTE — Telephone Encounter (Signed)
Rest, ice the knee, take 1000 mg tylenol.-thx

## 2012-08-08 NOTE — Telephone Encounter (Signed)
Patient has OV scheduled today at 3:45pm/SLS

## 2012-08-08 NOTE — Telephone Encounter (Signed)
Patient notified

## 2012-08-08 NOTE — Telephone Encounter (Signed)
Please call pt, if he is still experiencing pain in leg and is concerned about injury, advise him to seek medical care.

## 2012-08-08 NOTE — Telephone Encounter (Signed)
Call him back and ask him to be more specific about exactly what part of his leg hurts and HOW BAD it hurts and what was the MECHANISM of the injury (did he fall or did he just feel like he pulled something? Etc).-let me know.thx

## 2012-09-18 ENCOUNTER — Telehealth: Payer: Self-pay | Admitting: Family Medicine

## 2012-09-18 NOTE — Telephone Encounter (Signed)
Patient states that him and his wife are having a hard time getting b12 into syringe from vial.  Patient states that he can not afford to come for a nurse visit to get b12 injection.  I advised patient per Lowella Bandy that if he would like to bring medication and syringe into office that I would try to get medication into syringe for him so his wife can give injection but patient states that they have a hard time getting here through the week but he would talk to his wife to see if it would be possible.   Can he just take vit b12 tabs?  Patient states that he is really feeling fatigued since he has not had b12 injection since March.  Please advise.

## 2012-09-18 NOTE — Telephone Encounter (Signed)
Vit B12 tabs will not work for him b/c his gastrointestinal system can't absorb it adequately.  That's why we have to give it by injection---to bipass the intestinal tract.  Sorry-thx

## 2012-09-19 NOTE — Telephone Encounter (Signed)
Patient is aware.  He will talk to his wife and decide if he is going to come in for a nurse visit, injection only and call us back.

## 2012-09-20 ENCOUNTER — Ambulatory Visit (INDEPENDENT_AMBULATORY_CARE_PROVIDER_SITE_OTHER): Payer: BC Managed Care – PPO | Admitting: *Deleted

## 2012-09-20 DIAGNOSIS — E538 Deficiency of other specified B group vitamins: Secondary | ICD-10-CM

## 2012-09-20 MED ORDER — CYANOCOBALAMIN 1000 MCG/ML IJ SOLN
1000.0000 ug | Freq: Once | INTRAMUSCULAR | Status: AC
  Administered 2012-09-20: 1000 ug via INTRAMUSCULAR

## 2012-10-30 ENCOUNTER — Ambulatory Visit (INDEPENDENT_AMBULATORY_CARE_PROVIDER_SITE_OTHER): Payer: BC Managed Care – PPO | Admitting: Family Medicine

## 2012-10-30 DIAGNOSIS — E538 Deficiency of other specified B group vitamins: Secondary | ICD-10-CM

## 2012-10-30 MED ORDER — CYANOCOBALAMIN 1000 MCG/ML IJ SOLN
1000.0000 ug | Freq: Once | INTRAMUSCULAR | Status: AC
  Administered 2012-10-30: 1000 ug via INTRAMUSCULAR

## 2012-12-07 ENCOUNTER — Ambulatory Visit (INDEPENDENT_AMBULATORY_CARE_PROVIDER_SITE_OTHER): Payer: BC Managed Care – PPO | Admitting: *Deleted

## 2012-12-07 ENCOUNTER — Other Ambulatory Visit: Payer: BC Managed Care – PPO

## 2012-12-07 DIAGNOSIS — E538 Deficiency of other specified B group vitamins: Secondary | ICD-10-CM

## 2012-12-07 MED ORDER — CYANOCOBALAMIN 1000 MCG/ML IJ SOLN
1000.0000 ug | Freq: Once | INTRAMUSCULAR | Status: AC
  Administered 2012-12-07: 1000 ug via INTRAMUSCULAR

## 2013-01-29 ENCOUNTER — Ambulatory Visit (INDEPENDENT_AMBULATORY_CARE_PROVIDER_SITE_OTHER): Payer: BC Managed Care – PPO | Admitting: Nurse Practitioner

## 2013-01-29 ENCOUNTER — Encounter: Payer: Self-pay | Admitting: Nurse Practitioner

## 2013-01-29 VITALS — BP 100/60 | HR 95 | Temp 97.7°F | Ht 68.0 in | Wt 195.0 lb

## 2013-01-29 DIAGNOSIS — N3941 Urge incontinence: Secondary | ICD-10-CM

## 2013-01-29 DIAGNOSIS — E538 Deficiency of other specified B group vitamins: Secondary | ICD-10-CM

## 2013-01-29 DIAGNOSIS — M109 Gout, unspecified: Secondary | ICD-10-CM

## 2013-01-29 MED ORDER — CYANOCOBALAMIN 1000 MCG/ML IJ SOLN
1000.0000 ug | Freq: Once | INTRAMUSCULAR | Status: AC
  Administered 2013-01-29: 1000 ug via INTRAMUSCULAR

## 2013-01-29 MED ORDER — SOLIFENACIN SUCCINATE 5 MG PO TABS: 5.0000 mg | ORAL_TABLET | Freq: Every day | ORAL | Status: DC

## 2013-01-29 NOTE — Patient Instructions (Signed)
Take 800 mg ibuprophen up to 3 times daily with food for several days as needed for pain. Get labs done. We will call you with results.   Start vesicare. I have prescribed 1 tablet daily, but you may take 2 tablets daily if you are still having symptoms. Come back in 6 weeks. Happy Thanksgiving!  Gout Gout is an inflammatory arthritis caused by a buildup of uric acid crystals in the joints. Uric acid is a chemical that is normally present in the blood. When the level of uric acid in the blood is too high it can form crystals that deposit in your joints and tissues. This causes joint redness, soreness, and swelling (inflammation). Repeat attacks are common. Over time, uric acid crystals can form into masses (tophi) near a joint, destroying bone and causing disfigurement. Gout is treatable and often preventable. CAUSES  The disease begins with elevated levels of uric acid in the blood. Uric acid is produced by your body when it breaks down a naturally found substance called purines. Certain foods you eat, such as meats and fish, contain high amounts of purines. Causes of an elevated uric acid level include:  Being passed down from parent to child (heredity).  Diseases that cause increased uric acid production (such as obesity, psoriasis, and certain cancers).  Excessive alcohol use.  Diet, especially diets rich in meat and seafood.  Medicines, including certain cancer-fighting medicines (chemotherapy), water pills (diuretics), and aspirin.  Chronic kidney disease. The kidneys are no longer able to remove uric acid well.  Problems with metabolism. Conditions strongly associated with gout include:  Obesity.  High blood pressure.  High cholesterol.  Diabetes. Not everyone with elevated uric acid levels gets gout. It is not understood why some people get gout and others do not. Surgery, joint injury, and eating too much of certain foods are some of the factors that can lead to gout  attacks. SYMPTOMS   An attack of gout comes on quickly. It causes intense pain with redness, swelling, and warmth in a joint.  Fever can occur.  Often, only one joint is involved. Certain joints are more commonly involved:  Base of the big toe.  Knee.  Ankle.  Wrist.  Finger. Without treatment, an attack usually goes away in a few days to weeks. Between attacks, you usually will not have symptoms, which is different from many other forms of arthritis. DIAGNOSIS  Your caregiver will suspect gout based on your symptoms and exam. In some cases, tests may be recommended. The tests may include:  Blood tests.  Urine tests.  X-rays.  Joint fluid exam. This exam requires a needle to remove fluid from the joint (arthrocentesis). Using a microscope, gout is confirmed when uric acid crystals are seen in the joint fluid. TREATMENT  There are two phases to gout treatment: treating the sudden onset (acute) attack and preventing attacks (prophylaxis).  Treatment of an Acute Attack.  Medicines are used. These include anti-inflammatory medicines or steroid medicines.  An injection of steroid medicine into the affected joint is sometimes necessary.  The painful joint is rested. Movement can worsen the arthritis.  You may use warm or cold treatments on painful joints, depending which works best for you.  Treatment to Prevent Attacks.  If you suffer from frequent gout attacks, your caregiver may advise preventive medicine. These medicines are started after the acute attack subsides. These medicines either help your kidneys eliminate uric acid from your body or decrease your uric acid production. You may  need to stay on these medicines for a very long time.  The early phase of treatment with preventive medicine can be associated with an increase in acute gout attacks. For this reason, during the first few months of treatment, your caregiver may also advise you to take medicines usually used  for acute gout treatment. Be sure you understand your caregiver's directions. Your caregiver may make several adjustments to your medicine dose before these medicines are effective.  Discuss dietary treatment with your caregiver or dietitian. Alcohol and drinks high in sugar and fructose and foods such as meat, poultry, and seafood can increase uric acid levels. Your caregiver or dietician can advise you on drinks and foods that should be limited. HOME CARE INSTRUCTIONS   Do not take aspirin to relieve pain. This raises uric acid levels.  Only take over-the-counter or prescription medicines for pain, discomfort, or fever as directed by your caregiver.  Rest the joint as much as possible. When in bed, keep sheets and blankets off painful areas.  Keep the affected joint raised (elevated).  Apply warm or cold treatments to painful joints. Use of warm or cold treatments depends on which works best for you.  Use crutches if the painful joint is in your leg.  Drink enough fluids to keep your urine clear or pale yellow. This helps your body get rid of uric acid. Limit alcohol, sugary drinks, and fructose drinks.  Follow your dietary instructions. Pay careful attention to the amount of protein you eat. Your daily diet should emphasize fruits, vegetables, whole grains, and fat-free or low-fat milk products. Discuss the use of coffee, vitamin C, and cherries with your caregiver or dietician. These may be helpful in lowering uric acid levels.  Maintain a healthy body weight. SEEK MEDICAL CARE IF:   You develop diarrhea, vomiting, or any side effects from medicines.  You do not feel better in 24 hours, or you are getting worse. SEEK IMMEDIATE MEDICAL CARE IF:   Your joint becomes suddenly more tender, and you have chills or a fever. MAKE SURE YOU:   Understand these instructions.  Will watch your condition.  Will get help right away if you are not doing well or get worse. Document Released:  02/19/2000 Document Revised: 06/18/2012 Document Reviewed: 10/05/2011 [facility] Patient Information 2014 [place], [place].

## 2013-01-29 NOTE — Progress Notes (Signed)
Subjective:     [name] is a 62 y.o. male who presents with possible gout. Pain is located in the left ankle(s), and has been present for 3 days. Pain is described as aching and stabbing, and is constant. Associated symptoms: edema, effusion, tenderness and warmth. The patient has tried nothing for pain relief. Related to injury: no. In addition, he is concerned that he is having urge incontinence and post-void dribble. He used to take vesicare and see urologist for BPH. Also, he cleaned L ear w/q-tip a few days ago & had blood on q tip. No pain or swelling in ear.  The following portions of the patient's history were reviewed and updated as appropriate: allergies, current medications, past family history, past medical history, past social history, past surgical history and problem list.  Review of Systems Constitutional: negative for chills, fatigue, fevers, night sweats and weight loss Ears, nose, mouth, throat, and face: negative for ear drainage, earaches and had blood on q-tip few days ago Genitourinary:positive for nocturia, urinary incontinence and post void dribble, negative for dysuria Musculoskeletal:positive for arthralgias, stiff joints and c/o gout flares about every 3 months., negative for back pain, muscle weakness and myalgias    Objective:    BP 100/60  Pulse 95  Temp(Src) 97.7 F (36.5 C) (Oral)  Ht 5\' 8"  (1.727 m)  Wt 195 lb (88.451 kg)  BMI 29.66 kg/m2  SpO2 97% General appearance: alert, cooperative, appears stated age and no distress Head: Normocephalic, without obvious abnormality, atraumatic Ears: abnormal TM left ear - nml bony landmarks are not present. TM seems to be intact, small blood clot in canal. no swelling or erythema. Extremities: L ankle mildly erythematous& warm, small effusion     Assessment:    Gout flare up and Gouty flare up   2 urge incontinence & nocturia, likely r/t BPH 3 Small clot L ear canal. Intact TM   Plan:    Education about  gout causes and treatment discussed. Serum uric acid. Follow up in  6 weeks if symptoms do not improve.  Pt does not want to take colchicine, he prefers ibuprophen. Refuses intra-articular aspiration & injection. Discussed futurre need for uricosuric meds to prevent gout flares. Recommend allopurinol once gout flare resolved for 6 weeks to keep uric acid below 6.0. Advised to CB if NSAIDS not controlling pain-will prescribe colcrys. 2 Vesicare 5 mg qd 3 No intervention.

## 2013-01-29 NOTE — Progress Notes (Signed)
Pre-visit discussion using our clinic review tool. No additional management support is needed unless otherwise documented below in the visit note.  

## 2013-02-08 ENCOUNTER — Telehealth: Payer: Self-pay | Admitting: Nurse Practitioner

## 2013-02-08 NOTE — Telephone Encounter (Signed)
Pt must be seen. Tell him to go to [facility] clinic, [facility], or urgent care.

## 2013-02-08 NOTE — Telephone Encounter (Signed)
Patient says he has an ear infection now, he says when he was in the other day his ears looked fine, He is requesting to have something called in for an ear infection

## 2013-03-07 DIAGNOSIS — N182 Chronic kidney disease, stage 2 (mild): Secondary | ICD-10-CM

## 2013-03-07 HISTORY — DX: Chronic kidney disease, stage 2 (mild): N18.2

## 2013-03-15 ENCOUNTER — Ambulatory Visit: Payer: BC Managed Care – PPO | Admitting: Family Medicine

## 2013-03-23 ENCOUNTER — Other Ambulatory Visit: Payer: Self-pay | Admitting: Cardiology

## 2013-03-28 ENCOUNTER — Ambulatory Visit (INDEPENDENT_AMBULATORY_CARE_PROVIDER_SITE_OTHER): Payer: BC Managed Care – PPO | Admitting: Family Medicine

## 2013-03-28 ENCOUNTER — Telehealth: Payer: Self-pay | Admitting: Cardiology

## 2013-03-28 ENCOUNTER — Encounter: Payer: Self-pay | Admitting: Family Medicine

## 2013-03-28 ENCOUNTER — Telehealth: Payer: Self-pay | Admitting: Family Medicine

## 2013-03-28 VITALS — BP 117/79 | HR 69 | Temp 97.9°F | Resp 18 | Ht 68.0 in | Wt 193.0 lb

## 2013-03-28 DIAGNOSIS — E539 Vitamin B deficiency, unspecified: Secondary | ICD-10-CM

## 2013-03-28 DIAGNOSIS — M109 Gout, unspecified: Secondary | ICD-10-CM

## 2013-03-28 MED ORDER — PREDNISONE 20 MG PO TABS: ORAL_TABLET | ORAL | Status: DC

## 2013-03-28 MED ORDER — CYANOCOBALAMIN 1000 MCG/ML IJ SOLN
1000.0000 ug | Freq: Once | INTRAMUSCULAR | Status: AC
  Administered 2013-03-28: 1000 ug via INTRAMUSCULAR

## 2013-03-28 MED ORDER — HYDROCODONE-ACETAMINOPHEN 5-325 MG PO TABS: ORAL_TABLET | ORAL | Status: DC

## 2013-03-28 MED ORDER — ALLOPURINOL 100 MG PO TABS: 100.0000 mg | ORAL_TABLET | Freq: Every day | ORAL | Status: DC

## 2013-03-28 NOTE — Telephone Encounter (Signed)
Patient called regarding his ankle (hx gout) being very painful over past days. He also has phone call out to his PCP, but reached out to Dr. Percival Spanish to see if he could order him something for it. Patient informed that PCP is the appropriate provider to treat his gout, as Dr. Percival Spanish oversees patient's cardiac issues and also, Dr. Percival Spanish is out of the office today. Patient states he will await his PCP to call him back then. Patient went ahead and scheduled his annual follow up appointment with Dr. Percival Spanish for Feb 20th. His last ECHO was in April, 2014.

## 2013-03-28 NOTE — Progress Notes (Signed)
OFFICE NOTE  03/28/2013  CC:  Chief Complaint  Patient presents with  . Foot Pain    left foot     HPI: Patient is a 63 y.o. Caucasian male who is here for foot/ankle pain x 36h.  Medial portion, onset acutely without trauma. Severe pain, ibuprofen has not touched it.  Soaking does not help.  Worse with wt bearing.   Describes similar episode about a month ago in same spot, not as severe--never completely went away per pt. Came here for knee flare 09/2011 and I rx'd him allopurinol but he says he never got it/didn't know I rx'd it. He says he has had a few other "flares" in knees in the remote past.  Pertinent PMH:  Past Medical History  Diagnosis Date  . Prolapse of mitral valve     s/p repair  . Mitral valve regurgitation     s/p repair  . Obesity   . Atrial fibrillation     postoperative  . Anxiety   . BPH (benign prostatic hypertrophy)   . Urge incontinence   . Hypogonadism male 08/2010    Axiron started--?compliance? (repeat value 01/2011 mildly improved).  . Chronic otitis media     left, with chronic TM perforation; also with bilateral conductive hearing loss (sees Dr. [name] at [facility])   Past Surgical History  Procedure Laterality Date  . Mitral valve repair  2007    Dr. [name]  . Colonoscopy  2003    Normal per pt: Dr. Anthony Sar in [place]  . Hemorrhoid surgery  2000    doing fine since  . Transthoracic echocardiogram  06/2012    moderate MR (w/prosthetic MV)--stable compared to prior echo and pt is asymptomatic.  Dr. Rosezella Florida recommendation is to follow clinically--f/u with him in 1 yr.    MEDS:  Outpatient Prescriptions Prior to Visit  Medication Sig Dispense Refill  . aspirin 81 MG tablet Take 81 mg by mouth daily.        . metoprolol succinate (TOPROL-XL) 25 MG 24 hr tablet TAKE 1/2 TABLET DAILY  15 tablet  0  . solifenacin (VESICARE) 5 MG tablet Take 1 tablet (5 mg total) by mouth daily.  30 tablet  1  . atorvastatin (LIPITOR) 20 MG tablet Take 1 tablet  (20 mg total) by mouth daily.  30 tablet  2  . cyanocobalamin (,VITAMIN B-12,) 1000 MCG/ML injection       . Syringe/Needle, Disp, (SYRINGE 3CC/25GX1") 25G X 1" 3 ML MISC Use as directed for B12 injections  20 each  1   No facility-administered medications prior to visit.    PE: Blood pressure 117/79, pulse 69, temperature 97.9 F (36.6 C), temperature source Temporal, resp. rate 18, height 5\' 8"  (1.727 m), weight 193 lb (87.544 kg), SpO2 96.00%. Gen: Alert, well appearing.  Patient is oriented to person, place, time, and situation. SAY:TKZS: no injection, icteris, swelling, or exudate.  EOMI, PERRLA. Mouth: lips without lesion/swelling.  Oral mucosa pink and moist. Oropharynx without erythema, exudate, or swelling.  CV: RRR, no m/r/g.   LUNGS: CTA bilat, nonlabored resps, good aeration in all lung fields. Left ankle: mild warmth, focal swelling, and signif ttp in area just inferior to medial malleolus.  No skin erythema. ROM of ankle intact.  No pain with ROM of left ankle except when he inverts it.  He walks with an extreme limp.   IMPRESSION AND PLAN:  Gout Prednisone 40mg  qd x 5d, 20mg  qd x 5d. Vicodin 5/325, 1-2  q6h prn, #30, no RF. Start allopurinol 100mg  qd. Low purine diet discussed, handout given. F/u 3 wks.   An After Visit Summary was printed and given to the patient.

## 2013-03-28 NOTE — Telephone Encounter (Signed)
Patient called back & made an appt

## 2013-03-28 NOTE — Assessment & Plan Note (Signed)
Prednisone 40mg  qd x 5d, 20mg  qd x 5d. Vicodin 5/325, 1-2 q6h prn, #30, no RF. Start allopurinol 100mg  qd. Low purine diet discussed, handout given. F/u 3 wks.

## 2013-03-28 NOTE — Telephone Encounter (Signed)
New message     Pt want to know if Dr Percival Spanish will treat his gout?

## 2013-03-28 NOTE — Telephone Encounter (Signed)
Patient has gout in his left ankle. He has tried soaking it & has been taking ibuprofen which isn't helping.  He is in a lot of pain. Can something be called in to [facility] in [place]? Patient is actually in working in [place] today, will have daughter pu Rx. Please call patient when Rx called in.

## 2013-03-28 NOTE — Progress Notes (Signed)
Pre visit review using our clinic review tool, if applicable. No additional management support is needed unless otherwise documented below in the visit note. 

## 2013-03-29 LAB — CBC WITH DIFFERENTIAL/PLATELET
BASOS PCT: 0.8 % (ref 0.0–3.0)
Basophils Absolute: 0.1 10*3/uL (ref 0.0–0.1)
EOS PCT: 3.5 % (ref 0.0–5.0)
Eosinophils Absolute: 0.3 10*3/uL (ref 0.0–0.7)
HCT: 47.4 % (ref 39.0–52.0)
Hemoglobin: 15.8 g/dL (ref 13.0–17.0)
Lymphocytes Relative: 32.9 % (ref 12.0–46.0)
Lymphs Abs: 3.3 10*3/uL (ref 0.7–4.0)
MCHC: 33.2 g/dL (ref 30.0–36.0)
MCV: 87 fl (ref 78.0–100.0)
MONO ABS: 0.4 10*3/uL (ref 0.1–1.0)
MONOS PCT: 4.1 % (ref 3.0–12.0)
NEUTROS PCT: 58.7 % (ref 43.0–77.0)
Neutro Abs: 5.8 10*3/uL (ref 1.4–7.7)
Platelets: 202 10*3/uL (ref 150.0–400.0)
RBC: 5.45 Mil/uL (ref 4.22–5.81)
RDW: 13.7 % (ref 11.5–14.6)
WBC: 9.9 10*3/uL (ref 4.5–10.5)

## 2013-03-29 LAB — COMPREHENSIVE METABOLIC PANEL
ALT: 44 U/L (ref 0–53)
AST: 34 U/L (ref 0–37)
Albumin: 4 g/dL (ref 3.5–5.2)
Alkaline Phosphatase: 70 U/L (ref 39–117)
BUN: 22 mg/dL (ref 6–23)
CALCIUM: 9.1 mg/dL (ref 8.4–10.5)
CO2: 29 meq/L (ref 19–32)
Chloride: 104 mEq/L (ref 96–112)
Creatinine, Ser: 1.3 mg/dL (ref 0.4–1.5)
GFR: 62.01 mL/min (ref >60.00)
GLUCOSE: 61 mg/dL — AB (ref 70–99)
POTASSIUM: 4.3 meq/L (ref 3.5–5.1)
Sodium: 140 mEq/L (ref 135–145)
Total Bilirubin: 0.7 mg/dL (ref 0.3–1.2)
Total Protein: 7 g/dL (ref 6.0–8.3)

## 2013-03-29 LAB — URIC ACID: Uric Acid, Serum: 8.2 mg/dL — ABNORMAL HIGH (ref 4.0–7.8)

## 2013-04-01 ENCOUNTER — Telehealth: Payer: Self-pay | Admitting: Family Medicine

## 2013-04-01 NOTE — Telephone Encounter (Signed)
Please advise 

## 2013-04-01 NOTE — Telephone Encounter (Signed)
Patient notified

## 2013-04-01 NOTE — Telephone Encounter (Signed)
Pls call pt and recommend that he take only one of the prednisone tabs tomorrow, then he can stop this med as long as his ankle is much improved.

## 2013-04-01 NOTE — Telephone Encounter (Signed)
Patient Information:  Caller Name: Elazar  Phone: [phone]  Patient: [name], [name]  Gender: Male  DOB: 1951/02/15  Age: 63 Years  PCP: Ricardo Jericho [facility])  Office Follow Up:  Does the office need to follow up with this patient?: Yes  Instructions For The Office: Medication side effect question  RN Note:  Called while working.  No dizziness or lightheadedness. Denies feeling hyper or agitated. Had blurred vision this morning that is nearly resolved. Worked all day inspite of "not feeling right." Gout symptoms left ankle nearly resolved.  Asking if Prednisone can make you feel strange. Reviewed common Prednisone side effects.  RN suspects he may be having a side effect from Prednisone, not residual effect from one Hydrocodone taken 03/30/13.  Please call back today, 04/01/13 with MD recommendations regarding Prednisone dose and symptoms.  Symptoms  Reason For Call & Symptoms: Seen for gout 03/29/13.  Concerned about feeling  "wierd, spacy, out of sorts, not like myself." On Prednisone now; stopped taking Hydrocodone after 1st pill 03/30/13.  Reviewed Health History In EMR: Yes  Reviewed Medications In EMR: Yes  Reviewed Allergies In EMR: Yes  Reviewed Surgeries / Procedures: Yes  Date of Onset of Symptoms: 03/31/2013  Guideline(s) Used:  No Protocol Available - Information Only  Disposition Per Guideline:   Callback by PCP Today  Reason For Disposition Reached:   Nursing judgment  Advice Given:  N/A  Patient Will Follow Care Advice:  YES

## 2013-04-18 ENCOUNTER — Other Ambulatory Visit: Payer: Self-pay | Admitting: Cardiology

## 2013-04-19 ENCOUNTER — Ambulatory Visit: Payer: BC Managed Care – PPO | Admitting: Nurse Practitioner

## 2013-04-25 ENCOUNTER — Ambulatory Visit: Payer: BC Managed Care – PPO | Admitting: Family Medicine

## 2013-04-26 ENCOUNTER — Ambulatory Visit: Payer: BC Managed Care – PPO | Admitting: Cardiology

## 2013-05-21 ENCOUNTER — Other Ambulatory Visit: Payer: Self-pay

## 2013-05-21 MED ORDER — METOPROLOL SUCCINATE ER 25 MG PO TB24: ORAL_TABLET | ORAL | Status: DC

## 2013-06-20 ENCOUNTER — Encounter: Payer: Self-pay | Admitting: Cardiology

## 2013-06-20 ENCOUNTER — Ambulatory Visit (INDEPENDENT_AMBULATORY_CARE_PROVIDER_SITE_OTHER): Payer: BC Managed Care – PPO | Admitting: Cardiology

## 2013-06-20 VITALS — BP 130/98 | HR 73 | Ht 68.0 in | Wt 193.0 lb

## 2013-06-20 DIAGNOSIS — I340 Nonrheumatic mitral (valve) insufficiency: Secondary | ICD-10-CM

## 2013-06-20 DIAGNOSIS — Z9889 Other specified postprocedural states: Secondary | ICD-10-CM

## 2013-06-20 DIAGNOSIS — I10 Essential (primary) hypertension: Secondary | ICD-10-CM

## 2013-06-20 DIAGNOSIS — I059 Rheumatic mitral valve disease, unspecified: Secondary | ICD-10-CM

## 2013-06-20 NOTE — Patient Instructions (Signed)

## 2013-06-20 NOTE — Progress Notes (Signed)
HPI The patient presents for followup of mitral valve repair. He Since I last saw him he has done well. He denies any chest pressure, neck or arm discomfort. He denies any palpitations, presyncope or syncope. He has no PND or orthopnea. He's had no weight gain or edema.  He still works which is an active and physical job. He's had no new symptoms related to this. Last year he did have a followup echo which demonstrated moderately severe regurgitation. However, transesophageal echocardiography suggested this is only moderate. He was followed clinically.  No Known Allergies  Current Outpatient Prescriptions  Medication Sig Dispense Refill  . allopurinol (ZYLOPRIM) 100 MG tablet Take 1 tablet (100 mg total) by mouth daily.  30 tablet  3  . aspirin 81 MG tablet Take 81 mg by mouth daily.        . cyanocobalamin (,VITAMIN B-12,) 1000 MCG/ML injection       . metoprolol succinate (TOPROL-XL) 25 MG 24 hr tablet TAKE 1/2 TABLET DAILY  15 tablet  2  . solifenacin (VESICARE) 5 MG tablet Take 1 tablet (5 mg total) by mouth daily.  30 tablet  1  . Syringe/Needle, Disp, (SYRINGE 3CC/25GX1") 25G X 1" 3 ML MISC Use as directed for B12 injections  20 each  1   No current facility-administered medications for this visit.    Past Medical History  Diagnosis Date  . Prolapse of mitral valve     s/p repair  . Mitral valve regurgitation     s/p repair  . Obesity   . Atrial fibrillation     postoperative  . Anxiety   . BPH (benign prostatic hypertrophy)   . Urge incontinence   . Hypogonadism male 08/2010    Axiron started--?compliance? (repeat value 01/2011 mildly improved).  . Chronic otitis media     left, with chronic TM perforation; also with bilateral conductive hearing loss (sees Dr. [name] at [facility])    Past Surgical History  Procedure Laterality Date  . Mitral valve repair  2007    Dr. [name]  . Colonoscopy  2003    Normal per pt: Dr. Anthony Sar in [place]  . Hemorrhoid surgery  2000    doing  fine since  . Transthoracic echocardiogram  06/2012    moderate MR (w/prosthetic MV)--stable compared to prior echo and pt is asymptomatic.  Dr. Rosezella Florida recommendation is to follow clinically--f/u with him in 1 yr.    ROS:  As stated in the HPI and negative for all other systems.  PHYSICAL EXAM BP 130/98  Pulse 73  Ht 5\' 8"  (1.727 m)  Wt 193 lb (87.544 kg)  BMI 29.35 kg/m2 GENERAL:  Well appearing NECK:  No jugular venous distention, waveform within normal limits, carotid upstroke brisk and symmetric, no bruits, no thyromegaly LUNGS:  Clear to auscultation bilaterally BACK:  No CVA tenderness CHEST:  Well healed surgical scar HEART:  PMI not displaced or sustained,S1 and S2 within normal limits, no S3, no S4, no clicks, no rubs, soft 1/6 axillary holosystolic murmur no radiation, no diastolic murmurs ABD:  Flat, positive bowel sounds normal in frequency in pitch, no bruits, no rebound, no guarding, no midline pulsatile mass, no hepatomegaly, no splenomegaly EXT:  2 plus pulses throughout, no edema, no cyanosis no clubbing  EKG:  Sinus rhythm, rate 73, axis within normal limits, intervals within normal limits, no acute ST-T wave changes.  06/20/2013  ASSESSMENT AND PLAN   MITRAL REGURGITATION S/P REPAIR -  I will  give him an echocardiogram to followup with moderate regurgitation he had previously. Further evaluation will be based on these results.   HTN -  Blood pressures well controlled. He will continue the meds as listed.

## 2013-06-27 ENCOUNTER — Ambulatory Visit (INDEPENDENT_AMBULATORY_CARE_PROVIDER_SITE_OTHER): Payer: BC Managed Care – PPO | Admitting: Family Medicine

## 2013-06-27 ENCOUNTER — Other Ambulatory Visit: Payer: Self-pay | Admitting: Family Medicine

## 2013-06-27 ENCOUNTER — Encounter: Payer: Self-pay | Admitting: Family Medicine

## 2013-06-27 VITALS — BP 118/85 | HR 74 | Temp 98.3°F | Resp 18 | Ht 68.0 in | Wt 193.0 lb

## 2013-06-27 DIAGNOSIS — E538 Deficiency of other specified B group vitamins: Secondary | ICD-10-CM

## 2013-06-27 DIAGNOSIS — R5381 Other malaise: Secondary | ICD-10-CM

## 2013-06-27 DIAGNOSIS — M109 Gout, unspecified: Secondary | ICD-10-CM

## 2013-06-27 DIAGNOSIS — R5383 Other fatigue: Secondary | ICD-10-CM

## 2013-06-27 MED ORDER — CYANOCOBALAMIN 1000 MCG/ML IJ SOLN
1000.0000 ug | Freq: Once | INTRAMUSCULAR | Status: AC
  Administered 2013-06-27: 1000 ug via INTRAMUSCULAR

## 2013-06-27 NOTE — Progress Notes (Signed)
OFFICE VISIT  06/27/2013   CC:  Chief Complaint  Patient presents with  . Fatigue  . Hand Pain    knot on right finger     HPI:    Patient is a 63 y.o. Caucasian male who presents for fatigue. Longstanding feeling, seems worse last 4-6 mo or so, starts from the moment he wakes up, has not been taking his vit B12 injections lately. Recalls feeling much better energy level while on this injection consistently at one point in the last couple of years. No focal weakness.  +Excessive daytime sleepiness.  Wife says he snores but he has never asked her if he stops breathing for any amount of time during sleep.  No n/v/abd pains, no melena, no CP/SOB, no fever or wt loss.  No HA's.    Has had recent cardiology f/u and there is plan for echo to do routine eval of his prosthetic mitral valve.  He has been taking allopurinol 100 mg qd since 03/2013, has not returned for uric acid check. No recent gout flares.  Past Medical History  Diagnosis Date  . Prolapse of mitral valve     s/p repair  . Mitral valve regurgitation     s/p repair  . Obesity   . Atrial fibrillation     postoperative  . Anxiety   . BPH (benign prostatic hypertrophy)   . Urge incontinence   . Hypogonadism male 08/2010    Axiron started--?compliance? (repeat value 01/2011 mildly improved).  . Chronic otitis media     left, with chronic TM perforation; also with bilateral conductive hearing loss (sees Dr. [name] at [facility])  . Vitamin B12 deficiency 2012    Past Surgical History  Procedure Laterality Date  . Mitral valve repair  2007    Dr. [name]  . Colonoscopy  2003    Normal per pt: Dr. Anthony Sar in [place]  . Hemorrhoid surgery  2000    doing fine since  . Transthoracic echocardiogram  06/2012    moderate MR (w/prosthetic MV)--stable compared to prior echo and pt is asymptomatic.  Dr. Rosezella [place] recommendation is to follow clinically--f/u with him in 1 yr.    Outpatient Prescriptions Prior to Visit   Medication Sig Dispense Refill  . allopurinol (ZYLOPRIM) 100 MG tablet Take 1 tablet (100 mg total) by mouth daily.  30 tablet  3  . aspirin 81 MG tablet Take 81 mg by mouth daily.        . metoprolol succinate (TOPROL-XL) 25 MG 24 hr tablet TAKE 1/2 TABLET DAILY  15 tablet  2  . solifenacin (VESICARE) 5 MG tablet Take 1 tablet (5 mg total) by mouth daily.  30 tablet  1  . cyanocobalamin (,VITAMIN B-12,) 1000 MCG/ML injection       . Syringe/Needle, Disp, (SYRINGE 3CC/25GX1") 25G X 1" 3 ML MISC Use as directed for B12 injections  20 each  1   No facility-administered medications prior to visit.    No Known Allergies  PE: Blood pressure 118/85, pulse 74, temperature 98.3 F (36.8 C), temperature source Temporal, resp. rate 18, height 5\' 8"  (1.727 m), weight 193 lb (87.544 kg), SpO2 98.00%. Gen: Alert, well appearing.  Patient is oriented to person, place, time, and situation. AFFECT: pleasant, lucid thought and speech. CV: RRR, no m/r/g.   LUNGS: CTA bilat, nonlabored resps, good aeration in all lung fields. SKIN: no pallor or jaundice  LABS:  None today Recent:  Lab Results  Component Value Date  WBC 9.9 03/28/2013   HGB 15.8 03/28/2013   HCT 47.4 03/28/2013   MCV 87.0 03/28/2013   PLT 202.0 03/28/2013   Lab Results  Component Value Date   TSH 3.501 10/10/2011     Chemistry      Component Value Date/Time   NA 140 03/28/2013 1648   K 4.3 03/28/2013 1648   CL 104 03/28/2013 1648   CO2 29 03/28/2013 1648   BUN 22 03/28/2013 1648   CREATININE 1.3 03/28/2013 1648   CREATININE 1.25 10/10/2011 1550      Component Value Date/Time   CALCIUM 9.1 03/28/2013 1648   ALKPHOS 70 03/28/2013 1648   AST 34 03/28/2013 1648   ALT 44 03/28/2013 1648   BILITOT 0.7 03/28/2013 1648     IMPRESSION AND PLAN:  Fatigue Suspect this is multifactorial: vit B12 def untreated, hypogonadism, possible OSA. I told him to specifically ask his wife about whether or not he has apneic spells during sleep along  with his snoring. He is unable to tolerate testosterone replacement. Will restart vit B12 IM injections monthly-this was given today after his vit B12 and methylmalonic acid level were drawn.  Gout Continue allopurinol. Check uric acid level to see if it has improved any.   An After Visit Summary was printed and given to the patient.  FOLLOW UP: Return for Return for office f/u as needed.  Return monthly for vit B12 injection (nurse visit).

## 2013-06-27 NOTE — Progress Notes (Signed)
Pre visit review using our clinic review tool, if applicable. No additional management support is needed unless otherwise documented below in the visit note. 

## 2013-06-27 NOTE — Assessment & Plan Note (Signed)
Continue allopurinol. Check uric acid level to see if it has improved any.

## 2013-06-27 NOTE — Assessment & Plan Note (Signed)
Suspect this is multifactorial: vit B12 def untreated, hypogonadism, possible OSA. I told him to specifically ask his wife about whether or not he has apneic spells during sleep along with his snoring. He is unable to tolerate testosterone replacement. Will restart vit B12 IM injections monthly-this was given today after his vit B12 and methylmalonic acid level were drawn.

## 2013-06-30 LAB — METHYLMALONIC ACID, SERUM: Methylmalonic Acid, Quant: 254 nmol/L (ref 87–318)

## 2013-07-01 ENCOUNTER — Encounter: Payer: Self-pay | Admitting: Internal Medicine

## 2013-07-01 ENCOUNTER — Ambulatory Visit (HOSPITAL_COMMUNITY): Payer: BC Managed Care – PPO | Attending: Internal Medicine | Admitting: Cardiology

## 2013-07-01 DIAGNOSIS — I340 Nonrheumatic mitral (valve) insufficiency: Secondary | ICD-10-CM

## 2013-07-01 DIAGNOSIS — I369 Nonrheumatic tricuspid valve disorder, unspecified: Secondary | ICD-10-CM

## 2013-07-01 DIAGNOSIS — I059 Rheumatic mitral valve disease, unspecified: Secondary | ICD-10-CM | POA: Insufficient documentation

## 2013-07-01 DIAGNOSIS — Z9889 Other specified postprocedural states: Secondary | ICD-10-CM

## 2013-07-01 DIAGNOSIS — R5383 Other fatigue: Secondary | ICD-10-CM

## 2013-07-01 LAB — VITAMIN B12: Vitamin B-12: 987 pg/mL — ABNORMAL HIGH (ref 211–911)

## 2013-07-01 LAB — URIC ACID: Uric Acid, Serum: 7.2 mg/dL (ref 4.0–7.8)

## 2013-07-01 NOTE — Progress Notes (Signed)
Echo performed. 

## 2013-07-03 ENCOUNTER — Telehealth: Payer: Self-pay | Admitting: Cardiology

## 2013-07-03 ENCOUNTER — Encounter: Payer: Self-pay | Admitting: Family Medicine

## 2013-07-03 NOTE — Telephone Encounter (Signed)
New message   Patient is asking for clarification again on his echo results due to fact that he was given his results but did not understand.

## 2013-07-03 NOTE — Telephone Encounter (Signed)
Reviewed results of echo again with patient who stated understanding.

## 2013-07-04 ENCOUNTER — Telehealth: Payer: Self-pay | Admitting: Cardiology

## 2013-07-04 NOTE — Telephone Encounter (Signed)
Reviewed information of 2 D Echo with the pt for the third time.  He once again sates understanding.

## 2013-07-04 NOTE — Telephone Encounter (Signed)
New message           Pt would like for Pam to give him a call regarding the echo he had done previously.

## 2013-07-05 ENCOUNTER — Encounter: Payer: Self-pay | Admitting: Family Medicine

## 2013-07-05 ENCOUNTER — Ambulatory Visit (INDEPENDENT_AMBULATORY_CARE_PROVIDER_SITE_OTHER): Payer: BC Managed Care – PPO | Admitting: Family Medicine

## 2013-07-05 VITALS — BP 113/77 | HR 70 | Temp 97.8°F | Ht 68.0 in | Wt 188.0 lb

## 2013-07-05 DIAGNOSIS — M9261 Juvenile osteochondrosis of tarsus, right ankle: Secondary | ICD-10-CM | POA: Insufficient documentation

## 2013-07-05 DIAGNOSIS — I340 Nonrheumatic mitral (valve) insufficiency: Secondary | ICD-10-CM

## 2013-07-05 DIAGNOSIS — E538 Deficiency of other specified B group vitamins: Secondary | ICD-10-CM

## 2013-07-05 DIAGNOSIS — M109 Gout, unspecified: Secondary | ICD-10-CM

## 2013-07-05 DIAGNOSIS — I059 Rheumatic mitral valve disease, unspecified: Secondary | ICD-10-CM

## 2013-07-05 DIAGNOSIS — M928 Other specified juvenile osteochondrosis: Secondary | ICD-10-CM

## 2013-07-05 LAB — VITAMIN B12: VITAMIN B 12: 1197 pg/mL — AB (ref 211–911)

## 2013-07-05 MED ORDER — DICLOFENAC SODIUM 75 MG PO TBEC: DELAYED_RELEASE_TABLET | ORAL | Status: DC

## 2013-07-05 MED ORDER — ALLOPURINOL 100 MG PO TABS: ORAL_TABLET | ORAL | Status: DC

## 2013-07-05 NOTE — Progress Notes (Signed)
OFFICE NOTE  07/05/2013  CC: No chief complaint on file.    HPI: Patient is a 63 y.o. Caucasian male who is here for pain in back of right heal, onset yesterday.  Sees a bump on back of heel where achilles tendon attaches.  No redness or warmth. He was anxious and worried all day yesterday and today b/c worried about his heart--got report of echo results from two different people at cardiology office and he said they were conflicting.  Felt flushed in face and went to fire station and got bp checked--140s/80s.  No chest pains or SOB or nausea or diaphoresis or cough.  Vit B12 and methylmal acid results last week were of questionable validity/accuracy.  Pt got 1000 mcg IM shot of vit B12 on 06/27/13.  No recent joint pain. Still c/o feeling tired all the time, says recent vit B12 shot didn't help any.   Pertinent PMH:  Past Medical History  Diagnosis Date  . Prolapse of mitral valve     s/p repair  . Mitral valve regurgitation     s/p repair  . Obesity   . Atrial fibrillation     postoperative  . Anxiety   . BPH (benign prostatic hypertrophy)   . Urge incontinence   . Hypogonadism male 08/2010    Axiron started--?compliance? (repeat value 01/2011 mildly improved).  . Chronic otitis media     left, with chronic TM perforation; also with bilateral conductive hearing loss (sees Dr. [name] at [facility])  . Vitamin B12 deficiency 2012    MEDS:  Outpatient Prescriptions Prior to Visit  Medication Sig Dispense Refill  . allopurinol (ZYLOPRIM) 100 MG tablet Take 1 tablet (100 mg total) by mouth daily.  30 tablet  3  . aspirin 81 MG tablet Take 81 mg by mouth daily.        . cyanocobalamin (,VITAMIN B-12,) 1000 MCG/ML injection       . metoprolol succinate (TOPROL-XL) 25 MG 24 hr tablet TAKE 1/2 TABLET DAILY  15 tablet  2  . solifenacin (VESICARE) 5 MG tablet Take 1 tablet (5 mg total) by mouth daily.  30 tablet  1  . Syringe/Needle, Disp, (SYRINGE 3CC/25GX1") 25G X 1" 3 ML MISC Use as  directed for B12 injections  20 each  1   No facility-administered medications prior to visit.    PE: Blood pressure 113/77, pulse 70, temperature 97.8 F (36.6 C), temperature source Temporal, height 5\' 8"  (1.727 m), weight 188 lb (85.276 kg), SpO2 96.00%. Gen: Alert, well appearing.  Patient is oriented to person, place, time, and situation. DJS:HFWY: no injection, icteris, swelling, or exudate.  EOMI, PERRLA. Mouth: lips without lesion/swelling.  Oral mucosa pink and moist. Oropharynx without erythema, exudate, or swelling.  Neck - No masses or thyromegaly or limitation in range of motion CV: RRR, no m/r/g.   LUNGS: CTA bilat, nonlabored resps, good aeration in all lung fields. EXT: no clubbing, cyanosis, or edema.  Right heel: posterior aspect where achilles inserts has a mildly tender focal enlargement/nodule.  No erythema or warmth  No fluctuance.  Ankle and foot without tenderness, warmth, erythema, or pain with ROM or limitation of ROM. Ankle ROM intact without pain with resistance.   LABS: none today  Recent labs:   Uric acid 7.2 on 06/27/13 (down from 8.2 on 03/28/13) Vit B12 987 on 06/27/13 MMA 254 on 06/27/13  Lab Results  Component Value Date   WBC 9.9 03/28/2013   HGB 15.8 03/28/2013  HCT 47.4 03/28/2013   MCV 87.0 03/28/2013   PLT 202.0 03/28/2013     IMPRESSION AND PLAN:  Apophysitis of right calcaneus Quarter inch heel lift for right shoe recommended. Ice. Diclofenac 75mg  bid x 10d.  Gout Increase allopurinol to 200mg  po qd.  Vitamin B12 deficiency Hx of this, but recent vit B12 testing AFTER not having any injections for months was ELEVATED slightly. He did get an injection about 2 wks ago, but I want to recheck Vit B12 level to verify that this is truly elevated, recheck methylmalonic acid level as well.  Mitral regurgitation S/p MV replacement. Recent echo with stable mitral regurg, some aortic root enlargement is new. Per cardiology, repeat echo 1 yr,  no change in therapy.   An After Visit Summary was printed and given to the patient.   FOLLOW UP: 3 mo

## 2013-07-05 NOTE — Progress Notes (Signed)
Pre visit review using our clinic review tool, if applicable. No additional management support is needed unless otherwise documented below in the visit note. 

## 2013-07-09 LAB — METHYLMALONIC ACID, SERUM: Methylmalonic Acid, Quant: 192 nmol/L (ref 87–318)

## 2013-07-10 ENCOUNTER — Encounter: Payer: Self-pay | Admitting: Family Medicine

## 2013-07-10 DIAGNOSIS — E538 Deficiency of other specified B group vitamins: Secondary | ICD-10-CM | POA: Insufficient documentation

## 2013-07-10 NOTE — Assessment & Plan Note (Signed)
Quarter inch heel lift for right shoe recommended. Ice. Diclofenac 75mg  bid x 10d.

## 2013-07-10 NOTE — Assessment & Plan Note (Signed)
Increase allopurinol to 200mg  po qd.

## 2013-07-10 NOTE — Assessment & Plan Note (Signed)
S/p MV replacement. Recent echo with stable mitral regurg, some aortic root enlargement is new. Per cardiology, repeat echo 1 yr, no change in therapy.

## 2013-07-10 NOTE — Assessment & Plan Note (Signed)
Hx of this, but recent vit B12 testing AFTER not having any injections for months was ELEVATED slightly. He did get an injection about 2 wks ago, but I want to recheck Vit B12 level to verify that this is truly elevated, recheck methylmalonic acid level as well.

## 2013-07-25 ENCOUNTER — Ambulatory Visit: Payer: BC Managed Care – PPO | Admitting: *Deleted

## 2013-09-13 ENCOUNTER — Other Ambulatory Visit: Payer: Self-pay | Admitting: *Deleted

## 2013-09-13 MED ORDER — METOPROLOL SUCCINATE ER 25 MG PO TB24: ORAL_TABLET | ORAL | Status: DC

## 2013-09-19 ENCOUNTER — Ambulatory Visit: Payer: BC Managed Care – PPO | Admitting: Family Medicine

## 2013-09-20 ENCOUNTER — Ambulatory Visit: Payer: BC Managed Care – PPO | Admitting: Family Medicine

## 2013-10-08 ENCOUNTER — Ambulatory Visit: Payer: BC Managed Care – PPO | Admitting: Family Medicine

## 2013-10-16 NOTE — Telephone Encounter (Signed)
Opened in error

## 2013-12-10 ENCOUNTER — Telehealth: Payer: Self-pay | Admitting: *Deleted

## 2013-12-10 NOTE — Telephone Encounter (Signed)
He needs to try OTC mucinex DM as directed on the packaging. Otherwise, he needs to come in for o/v.  No rx cough meds w/out o/v to determine dx and appropriate treatment.

## 2013-12-10 NOTE — Telephone Encounter (Signed)
Patient notified

## 2013-12-10 NOTE — Telephone Encounter (Signed)
Patient office requesting that a cough med be sent into [facility]. Patient stated that he has had a cough for the last few days that's keeping him up at night. Please advise?

## 2014-01-17 ENCOUNTER — Ambulatory Visit (INDEPENDENT_AMBULATORY_CARE_PROVIDER_SITE_OTHER): Payer: BC Managed Care – PPO | Admitting: Family Medicine

## 2014-01-17 ENCOUNTER — Encounter: Payer: Self-pay | Admitting: Family Medicine

## 2014-01-17 VITALS — BP 111/77 | HR 84 | Temp 98.3°F | Resp 18 | Ht 68.0 in | Wt 198.0 lb

## 2014-01-17 DIAGNOSIS — N3941 Urge incontinence: Secondary | ICD-10-CM

## 2014-01-17 DIAGNOSIS — N400 Enlarged prostate without lower urinary tract symptoms: Secondary | ICD-10-CM

## 2014-01-17 MED ORDER — TAMSULOSIN HCL 0.4 MG PO CAPS: ORAL_CAPSULE | ORAL | Status: DC

## 2014-01-17 NOTE — Addendum Note (Signed)
Addended by: Jannette Spanner on: 01/17/2014 03:55 PM   Modules accepted: Orders

## 2014-01-17 NOTE — Progress Notes (Signed)
Pre visit review using our clinic review tool, if applicable. No additional management support is needed unless otherwise documented below in the visit note. 

## 2014-01-17 NOTE — Progress Notes (Signed)
OFFICE NOTE  01/17/2014  CC:  Chief Complaint  Patient presents with  . Urinary Frequency     HPI: Patient is a 63 y.o. Caucasian male who is here for urine frequency.   Nocturia x 1.  Urinates frequently in daytime, feels excessive urgency, no hesitancy, no incomplete emptying. No pain with urination, no blood in urine. Vesicare at the 5mg  and 10mg  dosing did not do anything per pt.  Dr. Janice Norrie, his urologist, started him on this about 7 yrs ago and he has not been on any other urinary meds per pt.  Specifically, he denies ever being put on any BPH meds.  Pertinent PMH:  Past medical, surgical, social, and family history reviewed and no changes are noted since last office visit.  MEDS:  Outpatient Prescriptions Prior to Visit  Medication Sig Dispense Refill  . allopurinol (ZYLOPRIM) 100 MG tablet 2 tabs po qd 60 tablet 6  . aspirin 81 MG tablet Take 81 mg by mouth daily.      . metoprolol succinate (TOPROL-XL) 25 MG 24 hr tablet TAKE 1/2 TABLET DAILY 15 tablet 5  . solifenacin (VESICARE) 5 MG tablet Take 1 tablet (5 mg total) by mouth daily. 30 tablet 1  . cyanocobalamin (,VITAMIN B-12,) 1000 MCG/ML injection     . diclofenac (VOLTAREN) 75 MG EC tablet 1 tab po bid x 10d with food 20 tablet 0  . Syringe/Needle, Disp, (SYRINGE 3CC/25GX1") 25G X 1" 3 ML MISC Use as directed for B12 injections 20 each 1   No facility-administered medications prior to visit.    PE: Blood pressure 111/77, pulse 84, temperature 98.3 F (36.8 C), temperature source Temporal, resp. rate 18, height 5\' 8"  (1.727 m), weight 198 lb (89.812 kg), SpO2 94 %. Gen: Alert, well appearing.  Patient is oriented to person, place, time, and situation. CV: RRR, no m/r/g.   LUNGS: CTA bilat, nonlabored resps, good aeration in all lung fields. Rectal exam: negative without mass, lesions or tenderness, PROSTATE EXAM: smooth and symmetric without nodules or tenderness.  LABS: none today  IMPRESSION AND PLAN:  1)  Urinary urgency/frequency, primarily daytime.  No help from full dosing of vesicare for a long time. Prostate exam today normal. PSA drawn today. Will start trial of flomax to see if possibly his sx's are more from BPH. Therapeutic expectations and side effect profile of medication discussed today.  Patient's questions answered.  An After Visit Summary was printed and given to the patient.  FOLLOW UP: 1 mo (ask pt about sleep study for excessive fatigue/daytime somnolence in the past??)

## 2014-01-18 LAB — PSA: PSA: 0.9 ng/mL (ref <=4.00)

## 2014-02-12 ENCOUNTER — Ambulatory Visit: Payer: BC Managed Care – PPO | Admitting: Nurse Practitioner

## 2014-02-17 ENCOUNTER — Encounter: Payer: Self-pay | Admitting: Family Medicine

## 2014-02-17 ENCOUNTER — Ambulatory Visit (INDEPENDENT_AMBULATORY_CARE_PROVIDER_SITE_OTHER): Payer: BC Managed Care – PPO | Admitting: Family Medicine

## 2014-02-17 VITALS — BP 154/88 | HR 83 | Temp 97.6°F | Resp 18 | Ht 68.0 in | Wt 196.0 lb

## 2014-02-17 DIAGNOSIS — R5382 Chronic fatigue, unspecified: Secondary | ICD-10-CM

## 2014-02-17 DIAGNOSIS — N138 Other obstructive and reflux uropathy: Secondary | ICD-10-CM

## 2014-02-17 DIAGNOSIS — J069 Acute upper respiratory infection, unspecified: Secondary | ICD-10-CM

## 2014-02-17 DIAGNOSIS — N401 Enlarged prostate with lower urinary tract symptoms: Secondary | ICD-10-CM

## 2014-02-17 NOTE — Progress Notes (Signed)
Pre visit review using our clinic review tool, if applicable. No additional management support is needed unless otherwise documented below in the visit note. 
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
## 2014-02-18 ENCOUNTER — Ambulatory Visit: Payer: BC Managed Care – PPO | Admitting: Family Medicine

## 2014-02-18 DIAGNOSIS — J069 Acute upper respiratory infection, unspecified: Secondary | ICD-10-CM

## 2014-02-18 DIAGNOSIS — N138 Other obstructive and reflux uropathy: Secondary | ICD-10-CM | POA: Insufficient documentation

## 2014-02-18 DIAGNOSIS — N401 Enlarged prostate with lower urinary tract symptoms: Principal | ICD-10-CM

## 2014-02-18 HISTORY — DX: Acute upper respiratory infection, unspecified: J06.9

## 2014-02-18 NOTE — Assessment & Plan Note (Signed)
W/u in the past unrevealing. I have asked pt to discuss his sleep with his wife: does he snore?  Apneic spells during sleep? Quality of sleep questionnaire given to pt to take home and answer with wife. If +/high risk for OSA then will refer to pulm for consideration of sleep study to eval for OSA.

## 2014-02-18 NOTE — Assessment & Plan Note (Signed)
Improved/stable on 0.4mg  flomax qhs.

## 2014-02-18 NOTE — Assessment & Plan Note (Signed)
Trial of mucinex DM or robitussin DM otc as directed on the box. May use OTC nasal saline spray or irrigation solution bid. OTC nonsedating antihistamines prn discussed.  Decongestant use discussed--ok if tolerated in the past w/out side effect and if pt has no hx of HTN.

## 2014-02-19 ENCOUNTER — Other Ambulatory Visit: Payer: Self-pay | Admitting: Cardiology

## 2014-05-07 ENCOUNTER — Telehealth: Payer: Self-pay | Admitting: Family Medicine

## 2014-05-07 DIAGNOSIS — N401 Enlarged prostate with lower urinary tract symptoms: Principal | ICD-10-CM

## 2014-05-07 DIAGNOSIS — N138 Other obstructive and reflux uropathy: Secondary | ICD-10-CM

## 2014-05-07 NOTE — Telephone Encounter (Signed)
Patient requests referral to [facility], no provider preference.

## 2014-05-07 NOTE — Telephone Encounter (Signed)
Please advise 

## 2014-05-07 NOTE — Telephone Encounter (Signed)
Referral ordered as per pt's request.

## 2014-05-09 ENCOUNTER — Encounter: Payer: Self-pay | Admitting: Nurse Practitioner

## 2014-05-09 ENCOUNTER — Ambulatory Visit (INDEPENDENT_AMBULATORY_CARE_PROVIDER_SITE_OTHER): Payer: Self-pay | Admitting: Nurse Practitioner

## 2014-05-09 VITALS — BP 112/80 | HR 93 | Temp 97.8°F | Ht 68.0 in | Wt 196.0 lb

## 2014-05-09 DIAGNOSIS — K5900 Constipation, unspecified: Secondary | ICD-10-CM

## 2014-05-09 DIAGNOSIS — R109 Unspecified abdominal pain: Secondary | ICD-10-CM

## 2014-05-09 DIAGNOSIS — H7292 Unspecified perforation of tympanic membrane, left ear: Secondary | ICD-10-CM

## 2014-05-09 DIAGNOSIS — H6692 Otitis media, unspecified, left ear: Secondary | ICD-10-CM

## 2014-05-09 LAB — POCT URINALYSIS DIPSTICK
Bilirubin, UA: NEGATIVE
Glucose, UA: NEGATIVE
KETONES UA: NEGATIVE
Leukocytes, UA: NEGATIVE
Nitrite, UA: NEGATIVE
PH UA: 5.5
Protein, UA: NEGATIVE
Spec Grav, UA: 1.015
Urobilinogen, UA: 0.2

## 2014-05-09 LAB — URINALYSIS, MICROSCOPIC ONLY

## 2014-05-09 MED ORDER — POLYETHYLENE GLYCOL 3350 17 GM/SCOOP PO POWD: ORAL | Status: DC

## 2014-05-09 MED ORDER — OFLOXACIN 0.3 % OT SOLN: 10.0000 [drp] | Freq: Every day | OTIC | Status: DC

## 2014-05-09 NOTE — Progress Notes (Signed)
Subjective:     [name] is a 64 y.o. male who presents for evaluation of abdominal pain and drainage from L ear.  Abd Pain: Onset was 3 days ago. Symptoms have been unchanged. The pain is described as aching, dull and pressure-like, and is 3/10 in intensity. Pain is located in the suprapubic region without radiation.  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  Alleviating factors: none. Associated symptoms: none. He gives history of frequency & takes flomax & will see urology in 2 weeks. He sometimes goes svereal days without BM. Had BM this morning. The patient denies belching, diarrhea, fever, flatus, nausea and vomiting. Ear: Hx of 2 TM surgeries. Pt says he  Has had perforated TM for "years". This am he noticed drainage from ear. No pain or fever.   The patient's history has been marked as reviewed and updated as appropriate.  Review of Systems Musculoskeletal:negative for back pain     Objective:    BP 112/80 mmHg  Pulse 93  Temp(Src) 97.8 F (36.6 C) (Temporal)  Ht 5\' 8"  (1.727 m)  Wt 196 lb (88.905 kg)  BMI 29.81 kg/m2  SpO2 93% General appearance: alert, cooperative, appears stated age and no distress Head: Normocephalic, without obvious abnormality, atraumatic Eyes: negative findings: lids and lashes normal and conjunctivae and sclerae normal Ears: normal TM and external ear canal right ear and abnormal TM left ear - no TM, honey-colored drainage in canal. middle ear is red Throat: lips, mucosa, and tongue normal; teeth and gums normal Back: no CVA tenderness Lungs: clear to auscultation bilaterally Heart: regular rate and rhythm, S1, S2 normal, no murmur, click, rub or gallop Abdomen: normal findings: no masses palpable and no organomegaly and abnormal findings:  obese    Assessment:Plan   1. Otitis media, chronic with perforation, left - ofloxacin (FLOXIN) 0.3 % otic solution; Place 10 drops into the left ear daily. Use for 7  to 10 days.  Dispense: 10 mL; Refill: 0  2. Constipation, unspecified constipation type - polyethylene glycol powder (GLYCOLAX/MIRALAX) powder; 17 G PO Once. If no bowel movement , repeat every hour, up to 4 doses in 24 hours.  Dispense: 500 g; Refill: 1  3. Abdominal pain, unspecified abdominal location DD: constipation, UTI, prostatitis - POCT urinalysis dipstick-trace blood - Urine culture - Urine Microscopic  F/u 1 week

## 2014-05-09 NOTE — Patient Instructions (Signed)
Use ear drops once daily. Warm drops in hand or pocket before using. Keep head tilted for 5 minutes. Keep ear dry when getting in shower-put cottonball in ear before getting into shower & take out when out of shower.   Use miralax as directed.  See Dr Anitra Lauth next week.

## 2014-05-09 NOTE — Progress Notes (Signed)
Pre visit review using our clinic review tool, if applicable. No additional management support is needed unless otherwise documented below in the visit note. 

## 2014-05-10 LAB — URINE CULTURE
Colony Count: NO GROWTH
Organism ID, Bacteria: NO GROWTH

## 2014-05-13 ENCOUNTER — Telehealth: Payer: Self-pay | Admitting: Nurse Practitioner

## 2014-05-13 NOTE — Telephone Encounter (Signed)
pls call pt: Advise Urine is nml. Also reschedule appt w/ DR Mcg. He is on my schedule for Friday, but I want him to see DR McG (I sent you a note about rescheduling him) Thnx!

## 2014-05-13 NOTE — Telephone Encounter (Signed)
Patient notified of results. Patient wants to cancel appt for Friday. Patient stated that he went to ER that night with the same symptoms. Patient is going to f/u with Dr. Marcille Buffy tomorrow.

## 2014-05-14 ENCOUNTER — Ambulatory Visit: Payer: Self-pay | Admitting: Family Medicine

## 2014-05-16 ENCOUNTER — Ambulatory Visit: Payer: Self-pay | Admitting: Nurse Practitioner

## 2014-06-06 ENCOUNTER — Encounter: Payer: Self-pay | Admitting: Family Medicine

## 2014-06-06 ENCOUNTER — Ambulatory Visit (INDEPENDENT_AMBULATORY_CARE_PROVIDER_SITE_OTHER): Payer: BLUE CROSS/BLUE SHIELD | Admitting: Family Medicine

## 2014-06-06 ENCOUNTER — Telehealth: Payer: Self-pay | Admitting: Family Medicine

## 2014-06-06 VITALS — BP 118/78 | HR 81 | Temp 98.1°F | Ht 68.0 in | Wt 196.0 lb

## 2014-06-06 DIAGNOSIS — M1000 Idiopathic gout, unspecified site: Secondary | ICD-10-CM

## 2014-06-06 DIAGNOSIS — M2550 Pain in unspecified joint: Secondary | ICD-10-CM

## 2014-06-06 LAB — CBC WITH DIFFERENTIAL/PLATELET
Basophils Absolute: 0.1 10*3/uL (ref 0.0–0.1)
Basophils Relative: 1 % (ref 0.0–3.0)
EOS PCT: 4.3 % (ref 0.0–5.0)
Eosinophils Absolute: 0.4 10*3/uL (ref 0.0–0.7)
HCT: 44.9 % (ref 39.0–52.0)
HEMOGLOBIN: 15.5 g/dL (ref 13.0–17.0)
LYMPHS ABS: 3.4 10*3/uL (ref 0.7–4.0)
LYMPHS PCT: 40.6 % (ref 12.0–46.0)
MCHC: 34.5 g/dL (ref 30.0–36.0)
MCV: 85.5 fl (ref 78.0–100.0)
MONOS PCT: 6.9 % (ref 3.0–12.0)
Monocytes Absolute: 0.6 10*3/uL (ref 0.1–1.0)
NEUTROS ABS: 3.9 10*3/uL (ref 1.4–7.7)
Neutrophils Relative %: 47.2 % (ref 43.0–77.0)
Platelets: 205 10*3/uL (ref 150.0–400.0)
RBC: 5.25 Mil/uL (ref 4.22–5.81)
RDW: 13.4 % (ref 11.5–15.5)
WBC: 8.4 10*3/uL (ref 4.0–10.5)

## 2014-06-06 LAB — COMPREHENSIVE METABOLIC PANEL
ALK PHOS: 71 U/L (ref 39–117)
ALT: 71 U/L — ABNORMAL HIGH (ref 0–53)
AST: 49 U/L — ABNORMAL HIGH (ref 0–37)
Albumin: 4 g/dL (ref 3.5–5.2)
BUN: 19 mg/dL (ref 6–23)
CO2: 29 meq/L (ref 19–32)
Calcium: 9.4 mg/dL (ref 8.4–10.5)
Chloride: 105 mEq/L (ref 96–112)
Creatinine, Ser: 1.11 mg/dL (ref 0.40–1.50)
GFR: 70.85 mL/min (ref >60.00)
GLUCOSE: 108 mg/dL — AB (ref 70–99)
POTASSIUM: 3.9 meq/L (ref 3.5–5.1)
SODIUM: 140 meq/L (ref 135–145)
TOTAL PROTEIN: 6.9 g/dL (ref 6.0–8.3)
Total Bilirubin: 0.6 mg/dL (ref 0.2–1.2)

## 2014-06-06 LAB — URIC ACID: Uric Acid, Serum: 7 mg/dL (ref 4.0–7.8)

## 2014-06-06 LAB — RHEUMATOID FACTOR

## 2014-06-06 LAB — SEDIMENTATION RATE: SED RATE: 12 mm/h (ref 0–22)

## 2014-06-06 LAB — HIGH SENSITIVITY CRP: CRP, High Sensitivity: 1.99 mg/L (ref 0.000–5.000)

## 2014-06-06 MED ORDER — MELOXICAM 15 MG PO TABS: ORAL_TABLET | ORAL | Status: DC

## 2014-06-06 MED ORDER — PREDNISONE 20 MG PO TABS: ORAL_TABLET | ORAL | Status: DC

## 2014-06-06 NOTE — Telephone Encounter (Signed)
Patient stated that he has blurry vision and mouth soreness.

## 2014-06-06 NOTE — Progress Notes (Signed)
Pre visit review using our clinic review tool, if applicable. No additional management support is needed unless otherwise documented below in the visit note. 

## 2014-06-06 NOTE — Telephone Encounter (Signed)
Patient's job is very physical & he is having a lot of joint pain. Patient has to squat a lot & it is hard to get back up. Patient is requesting a Rx if Dr. Anitra Lauth can send something in.  Steriods help however the side effects are not worth the benefits of the med.

## 2014-06-06 NOTE — Telephone Encounter (Signed)
Patient seen in office today to address his problems.

## 2014-06-06 NOTE — Telephone Encounter (Signed)
Pls call pt and ask him why he or his wife think he cannot take prednisone.

## 2014-06-06 NOTE — Telephone Encounter (Signed)
Please advise 

## 2014-06-06 NOTE — Telephone Encounter (Signed)
OK. I called pt and told him I am sending in meloxicam. I called pharmacy and cancelled the prednisone rx.

## 2014-06-06 NOTE — Telephone Encounter (Signed)
Patient went to drug store to pick up Rx & wife reminded him he can not take prednisone. Patient is requesting a different Rx.

## 2014-06-06 NOTE — Progress Notes (Signed)
OFFICE VISIT  06/06/2014   CC: Joint pain  HPI:    Patient is a 64 y.o. Caucasian male who presents for joint pain.   Has pain and stiffness in both hips, both knees, both ankles, all joints of both feet.  Worse when he is hot.  Going on a couple years or so, seems to be worse last 2 wks.  Sometimes he thinks L leg is worse than right. Denies any swelling or redness in knees, ankles, toes, feet.   Seems to be worse when inactive, gets better with walking around.  Ibup/goody powder is what he takes but only 3-4 times per week on average.   Has a hard time doing his job due to this pain.   Denies pain in UE joints or neck or back.  ROS: No fevers.  +Chronic fatigue. No wt loss.  No oral ulcers.   Past Medical History  Diagnosis Date  . Prolapse of mitral valve     s/p repair  . Mitral valve regurgitation     s/p repair  . Obesity   . Atrial fibrillation     postoperative  . Anxiety   . BPH (benign prostatic hypertrophy)   . Urge incontinence   . Hypogonadism male 08/2010    Axiron started--?compliance? (repeat value 01/2011 mildly improved).  . Chronic otitis media     left, with chronic TM perforation; also with bilateral conductive hearing loss (sees Dr. [name] at [facility])  . History of non anemic vitamin B12 deficiency 2012  . Chronic renal insufficiency, stage II (mild) 2015    Borderline stage II/III: CrCl'@60'  ml/min  . Gout     Past Surgical History  Procedure Laterality Date  . Mitral valve repair  2007    Dr. [name]  . Colonoscopy  2003    Normal per pt: Dr. Anthony Sar in [place]  . Hemorrhoid surgery  2000    doing fine since  . Transthoracic echocardiogram  06/2012; 06/2013    moderate MR (MV repair)--stable compared to prior echo and pt is asymptomatic.  Repeat echo 06/2013: Stable MV repair.  Mild regurg.  Mild AO root dilatation. No change in therapy.    Outpatient Prescriptions Prior to Visit  Medication Sig Dispense Refill  . allopurinol (ZYLOPRIM) 100 MG  tablet 2 tabs po qd 60 tablet 6  . aspirin 81 MG tablet Take 81 mg by mouth daily.      . metoprolol succinate (TOPROL-XL) 25 MG 24 hr tablet TAKE 1/2 TABLET DAILY 15 tablet 5  . metoprolol succinate (TOPROL-XL) 25 MG 24 hr tablet TAKE 1/2 TABLET DAILY NEEDS APPT FOR MORE REFILLS 15 tablet 5  . ofloxacin (FLOXIN) 0.3 % otic solution Place 10 drops into the left ear daily. Use for 7 to 10 days. (Patient not taking: Reported on 06/06/2014) 10 mL 0  . polyethylene glycol powder (GLYCOLAX/MIRALAX) powder 17 G PO Once. If no bowel movement , repeat every hour, up to 4 doses in 24 hours. (Patient not taking: Reported on 06/06/2014) 500 g 1  . tamsulosin (FLOMAX) 0.4 MG CAPS capsule 1-2 tabs po qhs (Patient not taking: Reported on 06/06/2014) 60 capsule 1   No facility-administered medications prior to visit.    No Known Allergies  ROS As per HPI  PE: Blood pressure 118/78, pulse 81, temperature 98.1 F (36.7 C), temperature source Oral, height '5\' 8"'  (1.727 m), weight 196 lb (88.905 kg), SpO2 95 %. Gen: Alert, well appearing.  Patient is oriented to person,  place, time, and situation. Eyes: no injection or swelling or icterus. Shoulders, elbows, wrists, and all fingers without any erythema, swelling, stiffness, or tenderness. LEGS: no swelling, erythema, tenderness, or stiffness of hips, knees, ankles, feet, or toes. No LE edema.   SKIN: he has a tennis ball sized patch of erythematous, hyperkeratotic rash just inferior to knee cap on both sides.  Says this rash is chronic, only itches sometimes.   Has similar, although milder, rash around dorsal aspect of left thumb.  LABS:    Chemistry      Component Value Date/Time   NA 140 06/06/2014 1435   K 3.9 06/06/2014 1435   CL 105 06/06/2014 1435   CO2 29 06/06/2014 1435   BUN 19 06/06/2014 1435   CREATININE 1.11 06/06/2014 1435   CREATININE 1.25 10/10/2011 1550      Component Value Date/Time   CALCIUM 9.4 06/06/2014 1435   ALKPHOS 71 06/06/2014  1435   AST 49* 06/06/2014 1435   ALT 71* 06/06/2014 1435   BILITOT 0.6 06/06/2014 1435     Uric acid 7.2 on 06/27/13  IMPRESSION AND PLAN:  Chronic polyarthralgia; he does not exhibit any signs of inflammatory arthritis. Psoriatic arthritis would be a consideration if the patches on his knees per psoriasis but they appear more consistent with eczema.  His acute gout flares have stopped now that he is consistently taking allopurinol.  Patient asks for prednisone b/c he says when he has taken this in the past for either bronchitis or gout, it has made him feel a lot better in all his joints.   I agreed to rx prednisone 36m qd x 5d, then 28mqd x 5d and see how he responds.  In addition, will check x-rays of knees and ankles to eval for any sign of joint erosion that would support dx of inflammatory arthritis. Will also check CBC w/diff, CMET, ESR, CRP, Rh factor, Uric acid, and ANA today.  Patient states that if rheumatology input is desired in the future that he prefers Dr. BeAmil Amen/c his wife already sees him.   An After Visit Summary was printed and given to the patient.   FOLLOW UP: Return for f/u to be determined based on results of work up.   ADDENDUM 06/06/14, 6 pm: pt called back and said he was reminded by his wife when he got home today that he is intolerant of prednisone (mouth pain, blurry vision) and asked for change to different med. I cancelled prednisone order and sent in meloxicam 1565md for him to try.--PM

## 2014-06-09 LAB — ANA: ANA: NEGATIVE

## 2014-06-10 ENCOUNTER — Other Ambulatory Visit: Payer: Self-pay | Admitting: Family Medicine

## 2014-06-10 ENCOUNTER — Other Ambulatory Visit: Payer: BLUE CROSS/BLUE SHIELD

## 2014-06-10 DIAGNOSIS — R740 Nonspecific elevation of levels of transaminase and lactic acid dehydrogenase [LDH]: Principal | ICD-10-CM

## 2014-06-10 DIAGNOSIS — R7401 Elevation of levels of liver transaminase levels: Secondary | ICD-10-CM

## 2014-06-11 LAB — HEPATITIS C ANTIBODY: HCV AB: NEGATIVE

## 2014-06-11 LAB — HEPATITIS B SURFACE ANTIGEN: Hepatitis B Surface Ag: NEGATIVE

## 2014-06-12 ENCOUNTER — Other Ambulatory Visit: Payer: Self-pay | Admitting: *Deleted

## 2014-06-12 ENCOUNTER — Other Ambulatory Visit: Payer: Self-pay | Admitting: Family Medicine

## 2014-06-12 DIAGNOSIS — R7401 Elevation of levels of liver transaminase levels: Secondary | ICD-10-CM

## 2014-06-12 DIAGNOSIS — R740 Nonspecific elevation of levels of transaminase and lactic acid dehydrogenase [LDH]: Principal | ICD-10-CM

## 2014-06-23 ENCOUNTER — Ambulatory Visit (HOSPITAL_COMMUNITY)
Admission: RE | Admit: 2014-06-23 | Discharge: 2014-06-23 | Disposition: A | Discharge status: Home / Self Care | Payer: BLUE CROSS/BLUE SHIELD | Source: Ambulatory Visit | Attending: Family Medicine | Admitting: Family Medicine

## 2014-06-23 DIAGNOSIS — M25571 Pain in right ankle and joints of right foot: Secondary | ICD-10-CM | POA: Diagnosis not present

## 2014-06-23 DIAGNOSIS — M25572 Pain in left ankle and joints of left foot: Secondary | ICD-10-CM | POA: Insufficient documentation

## 2014-06-23 DIAGNOSIS — M25562 Pain in left knee: Secondary | ICD-10-CM | POA: Diagnosis present

## 2014-06-23 DIAGNOSIS — M25561 Pain in right knee: Secondary | ICD-10-CM | POA: Insufficient documentation

## 2014-06-23 DIAGNOSIS — M2550 Pain in unspecified joint: Secondary | ICD-10-CM

## 2014-06-23 DIAGNOSIS — M1000 Idiopathic gout, unspecified site: Secondary | ICD-10-CM

## 2014-06-30 ENCOUNTER — Other Ambulatory Visit: Payer: Self-pay | Admitting: Family Medicine

## 2014-06-30 MED ORDER — ALLOPURINOL 100 MG PO TABS: ORAL_TABLET | ORAL | Status: DC

## 2014-07-03 ENCOUNTER — Ambulatory Visit (INDEPENDENT_AMBULATORY_CARE_PROVIDER_SITE_OTHER): Payer: BLUE CROSS/BLUE SHIELD | Admitting: Cardiology

## 2014-07-03 ENCOUNTER — Encounter: Payer: Self-pay | Admitting: Cardiology

## 2014-07-03 VITALS — BP 118/68 | HR 77 | Ht 67.0 in | Wt 201.0 lb

## 2014-07-03 DIAGNOSIS — I10 Essential (primary) hypertension: Secondary | ICD-10-CM

## 2014-07-03 NOTE — Patient Instructions (Signed)
Dr.Hochrein wants you to follow-up in: ONE YEAR You will receive a reminder letter in the mail two months in advance. If you don't receive a letter, please call our office to schedule the follow-up appointment.  

## 2014-07-03 NOTE — Progress Notes (Signed)
HPI The patient presents for followup of mitral valve repair. He Since I last saw him he has done well. 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  He denies a  chest pressure, neck or arm discomfort. He denies any palpitations, presyncope or syncope. He has no PND or orthopnea. He's had no weight gain or edema.  He has had mild mitral stenosis and regurgitation post repair. This was the most recent echo. Prior to this it had been considered moderate.   Allergies  Allergen Reactions  . Ivp Dye [Iodinated Diagnostic Agents] Anaphylaxis    Current Outpatient Prescriptions  Medication Sig Dispense Refill  . allopurinol (ZYLOPRIM) 100 MG tablet 2 tabs po qd (Patient taking differently: Take 100 mg by mouth daily. 2 tabs po qd) 60 tablet 3  . aspirin 81 MG tablet Take 81 mg by mouth daily.      . meloxicam (MOBIC) 15 MG tablet 1 tab po qd as needed for joint pain and stiffness 30 tablet 3  . metoprolol succinate (TOPROL-XL) 25 MG 24 hr tablet TAKE 1/2 TABLET DAILY 15 tablet 5   No current facility-administered medications for this visit.    Past Medical History  Diagnosis Date  . Prolapse of mitral valve     s/p repair  . Mitral valve regurgitation     s/p repair  . Obesity   . Atrial fibrillation     postoperative  . Anxiety   . BPH (benign prostatic hypertrophy)   . Urge incontinence   . Hypogonadism male 08/2010    Axiron started--?compliance? (repeat value 01/2011 mildly improved).  . Chronic otitis media     left, with chronic TM perforation; also with bilateral conductive hearing loss (sees Dr. [name] at [facility])  . History of non anemic vitamin B12 deficiency 2012  . Chronic renal insufficiency, stage II (mild) 2015    Borderline stage II/III: CrCl@60  ml/min  . Gout     Past Surgical History  Procedure Laterality Date  . Mitral valve repair  2007    Dr. [name]  . Colonoscopy  2003    Normal per pt: Dr. Anthony Sar in [place]  .  Hemorrhoid surgery  2000    doing fine since  . Transthoracic echocardiogram  06/2012; 06/2013    moderate MR (MV repair)--stable compared to prior echo and pt is asymptomatic.  Repeat echo 06/2013: Stable MV repair.  Mild regurg.  Mild AO root dilatation. No change in therapy.    ROS:  As stated in the HPI and negative for all other systems.  PHYSICAL EXAM BP 118/68 mmHg  Pulse 77  Ht 5\' 7"  (1.702 m)  Wt 201 lb (91.173 kg)  BMI 31.47 kg/m2 GENERAL:  Well appearing NECK:  No jugular venous distention, waveform within normal limits, carotid upstroke brisk and symmetric, no bruits, no thyromegaly LUNGS:  Clear to auscultation bilaterally BACK:  No CVA tenderness CHEST:  Well healed surgical scar HEART:  PMI not displaced or sustained,S1 and S2 within normal limits, no S3, no S4, no clicks, no rubs, soft 1/6 axillary holosystolic murmur no radiation, no diastolic murmurs ABD:  Flat, positive bowel sounds normal in frequency in pitch, no bruits, no rebound, no guarding, no midline pulsatile mass, no hepatomegaly, no splenomegaly EXT:  2 plus pulses throughout, no edema, no cyanosis no clubbing  EKG:  Sinus rhythm, rate 77, axis within normal limits, intervals within normal limits, no acute ST-T wave changes.  07/03/2014  ASSESSMENT AND PLAN   MITRAL REGURGITATION S/P REPAIR -  The patient had stable valve repair demonstrated on echo last year. No change in therapy is indicated. No further imaging is indicated. I will reassess in 1 year.  At that time I will likely do routine echocardiography.   HTN -  Blood pressures well controlled. He will continue the meds as listed.

## 2014-07-08 ENCOUNTER — Other Ambulatory Visit: Payer: BLUE CROSS/BLUE SHIELD

## 2014-09-03 ENCOUNTER — Ambulatory Visit (INDEPENDENT_AMBULATORY_CARE_PROVIDER_SITE_OTHER): Payer: BLUE CROSS/BLUE SHIELD | Admitting: Nurse Practitioner

## 2014-09-03 ENCOUNTER — Encounter: Payer: Self-pay | Admitting: Nurse Practitioner

## 2014-09-03 VITALS — BP 121/80 | HR 77 | Temp 98.2°F | Resp 18 | Ht 68.0 in | Wt 200.0 lb

## 2014-09-03 DIAGNOSIS — L255 Unspecified contact dermatitis due to plants, except food: Secondary | ICD-10-CM | POA: Diagnosis not present

## 2014-09-03 DIAGNOSIS — M79652 Pain in left thigh: Secondary | ICD-10-CM

## 2014-09-03 MED ORDER — PREDNISONE 10 MG PO TABS: ORAL_TABLET | ORAL | Status: DC

## 2014-09-03 NOTE — Progress Notes (Signed)
Subjective:     [name] is a 64 y.o. male. C/o rash on bilat forearms & L thigh paresthesia. Rash onset 5 days after cutting shrubs. Itchy. No other symptoms. Using calamine lotion without relief. Thigh: onset 6 weeks. Paresthesia outer thigh, weakness-cannot get up from sitting position. Denies back pain, bowel or bladder problems. Went to chiropractor about 2 mos ao-had xrays & several "manipulations" with improvement. Had to stop going due to cost. All symptoms have returned.  The following portions of the patient's history were reviewed and updated as appropriate: allergies, current medications, past medical history, past social history, past surgical history and problem list.  Review of Systems Pertinent items are noted in HPI.    Objective:    BP 121/80 mmHg  Pulse 77  Temp(Src) 98.2 F (36.8 C) (Temporal)  Resp 18  Ht 5\' 8"  (1.727 m)  Wt 200 lb (90.719 kg)  BMI 30.42 kg/m2  SpO2 96% BP 121/80 mmHg  Pulse 77  Temp(Src) 98.2 F (36.8 C) (Temporal)  Resp 18  Ht 5\' 8"  (1.727 m)  Wt 200 lb (90.719 kg)  BMI 30.42 kg/m2  SpO2 96% General appearance: alert, cooperative, appears stated age and no distress Eyes: negative findings: lids and lashes normal and conjunctivae and sclerae normal Back: no spinal tenderness, no SIJ tenderness, no accessory muscle tenderness. neg straight leg raise. L hip flexors seem weaker than R. No tenderness over greater trocanter. Skin: erythematous bumpy rash forearms, inner. no sighns of infection.  Neurologic: Grossly normal    Assessment:Plan   1. Plant dermatitis Ice, benadryl cream - predniSONE (DELTASONE) 10 MG tablet; Starting tomorrow, Take 4Tpo qam X 3d, then 3T po qam X 3d, then 2T po qd X 3d, then 1T po qam X 3d.  Dispense: 30 tablet; Refill: 0  2. Left thigh pain - predniSONE (DELTASONE) 10 MG tablet; Starting tomorrow, Take 4Tpo qam X 3d, then 3T po qam X 3d, then 2T po qd X 3d, then 1T po qam X 3d.  Dispense: 30 tablet;  Refill: 0 - Ambulatory referral to Spine Surgery  F/u PRN

## 2014-09-03 NOTE — Progress Notes (Signed)
Pre visit review using our clinic review tool, if applicable. No additional management support is needed unless otherwise documented below in the visit note. 

## 2014-09-03 NOTE — Patient Instructions (Signed)
Start prednisone tomorrow. It will help rash & may help with thigh discomfort.  You may apply benadryl cream to rash & ice packs for itching.  Please see back specialist.  Nice to see you.

## 2014-09-11 ENCOUNTER — Other Ambulatory Visit: Payer: Self-pay | Admitting: *Deleted

## 2014-09-11 MED ORDER — ALLOPURINOL 100 MG PO TABS: ORAL_TABLET | ORAL | Status: DC

## 2014-09-11 NOTE — Telephone Encounter (Signed)
RF request for allopurinol.  LOV: 06/30/14 Next ov: None Last written: 06/30/14 #60 w/ 3Rf

## 2014-09-25 ENCOUNTER — Ambulatory Visit (INDEPENDENT_AMBULATORY_CARE_PROVIDER_SITE_OTHER): Payer: BLUE CROSS/BLUE SHIELD | Admitting: Nurse Practitioner

## 2014-09-25 ENCOUNTER — Encounter: Payer: Self-pay | Admitting: Nurse Practitioner

## 2014-09-25 VITALS — BP 120/80 | HR 74 | Temp 98.4°F | Resp 18 | Ht 68.0 in | Wt 197.0 lb

## 2014-09-25 DIAGNOSIS — H7290 Unspecified perforation of tympanic membrane, unspecified ear: Secondary | ICD-10-CM

## 2014-09-25 DIAGNOSIS — M5442 Lumbago with sciatica, left side: Secondary | ICD-10-CM | POA: Insufficient documentation

## 2014-09-25 DIAGNOSIS — H6690 Otitis media, unspecified, unspecified ear: Secondary | ICD-10-CM | POA: Insufficient documentation

## 2014-09-25 DIAGNOSIS — H7292 Unspecified perforation of tympanic membrane, left ear: Secondary | ICD-10-CM

## 2014-09-25 DIAGNOSIS — H6692 Otitis media, unspecified, left ear: Secondary | ICD-10-CM | POA: Diagnosis not present

## 2014-09-25 MED ORDER — OFLOXACIN 0.3 % OT SOLN: 10.0000 [drp] | Freq: Two times a day (BID) | OTIC | Status: DC

## 2014-09-25 NOTE — Patient Instructions (Addendum)
Use ear drops as directed. Warm in hands for 5 minutes before applying. Keep ear dry.  Use ear plugs if you must get into pool.  You should feel better by Saturday, if not, consider going to Saturday clinic at [place]. Hours 9-12.  Take 2 tablets ibuprophen with food up to twice daily for back pain.  Please see Dr Anitra Lauth next week.

## 2014-09-25 NOTE — Progress Notes (Signed)
Pre visit review using our clinic review tool, if applicable. No additional management support is needed unless otherwise documented below in the visit note. 

## 2014-09-25 NOTE — Progress Notes (Signed)
   Subjective:    Patient ID: [name], male    DOB: 09-28-1950, 64 y.o.   MRN: [id number]  HPI Comments: Martin Majestic swimming yesterday w/known TM perforation. Also asks what he can do about back pain. States prednisone made him feel "weird" & he never wants to take it again.  Otalgia  There is pain in the left ear. This is a recurrent problem. The current episode started yesterday. The problem occurs constantly. The problem has been gradually worsening. There has been no fever (feels chilled). The pain is moderate (pain located anterior to pinna). Associated symptoms include ear discharge. Pertinent negatives include no coughing, headaches, hearing loss or sore throat. Associated symptoms comments: Wheezing this am relieved by wife's inhaler. chronic TM perforation      Review of Systems  HENT: Positive for ear discharge and ear pain. Negative for hearing loss and sore throat.   Respiratory: Negative for cough.   Neurological: Negative for headaches.       Objective:   Physical Exam  Constitutional: He is oriented to person, place, and time. He appears well-developed and well-nourished. He appears distressed.  States doesn't feel well & is chilled. No fever in ofc.  HENT:  Head: Normocephalic and atraumatic.  Right Ear: External ear normal.  Left Ear: There is drainage (purulent). Tympanic membrane is perforated.  Mouth/Throat: Oropharynx is clear and moist. No oropharyngeal exudate.  Eyes: Conjunctivae are normal. Right eye exhibits no discharge. Left eye exhibits no discharge.  Neck: Normal range of motion. Neck supple. No thyromegaly present.  Cardiovascular: Normal rate, regular rhythm and normal heart sounds.   No murmur heard. Pulmonary/Chest: Effort normal and breath sounds normal.  Lymphadenopathy:    He has no cervical adenopathy.  Neurological: He is alert and oriented to person, place, and time.  Skin: Skin is warm and dry.  Psychiatric: He has a normal mood and affect.  His behavior is normal. Thought content normal.  Vitals reviewed.         Assessment & Plan:  1. Otitis media, chronic with perforation, left - ofloxacin (FLOXIN) 0.3 % otic solution; Place 10 drops into the left ear 2 (two) times daily.  Dispense: 10 mL; Refill: 0  2. Back pain 400 mf ibuprophen BID PRN with food  F/u 1 week

## 2014-09-26 ENCOUNTER — Telehealth: Payer: Self-pay | Admitting: Family Medicine

## 2014-09-26 MED ORDER — HYDROCODONE-ACETAMINOPHEN 5-325 MG PO TABS: 1.0000 | ORAL_TABLET | Freq: Four times a day (QID) | ORAL | Status: DC | PRN

## 2014-09-26 NOTE — Telephone Encounter (Signed)
Left detailed message stating Rx ready for p/u.

## 2014-09-26 NOTE — Telephone Encounter (Signed)
Vicodin rx printed. 

## 2014-09-26 NOTE — Telephone Encounter (Signed)
Pt was seen yesterday by Nicky Pugh for ear pain. He was prescribed an ear drop and when he got to Pharmacy they stated they" where out of ear drops but gave him eye drops and said they where the same thing." Pt staes he has not slept all night from terrible pain and would like to know if Dr. Anitra Lauth could prescribe a RX for pain.

## 2014-11-20 ENCOUNTER — Other Ambulatory Visit: Payer: Self-pay | Admitting: Cardiology

## 2015-06-11 ENCOUNTER — Other Ambulatory Visit: Payer: Self-pay | Admitting: *Deleted

## 2015-06-11 MED ORDER — ALLOPURINOL 100 MG PO TABS: ORAL_TABLET | ORAL | Status: DC

## 2015-06-11 NOTE — Telephone Encounter (Signed)
Spoke to pt and he stated that he is no longer seeing Dr. Anitra Lauth. He stated that he has a new PCP. I advised pt that he will need to contact [facility] and left them know he has a new PCP, pt voiced understanding. Spoke to [place] at [facility] and has Rx cancelled.

## 2015-06-11 NOTE — Telephone Encounter (Signed)
Will send in 1 mo supply with 2 RF's but pt needs office follow up sometime in the next 3 months before any further RF's can be given.-thx

## 2015-06-11 NOTE — Telephone Encounter (Signed)
RF request for allopurinol LOV: 06/06/14 Next ov: None Last written: 09/11/14 #60 w/ 3RF  Please advise. Thanks.

## 2015-07-02 ENCOUNTER — Other Ambulatory Visit: Payer: Self-pay | Admitting: Cardiology

## 2015-07-02 NOTE — Telephone Encounter (Signed)
Rx has been sent to the pharmacy electronically. ° °

## 2015-07-03 ENCOUNTER — Encounter: Payer: BLUE CROSS/BLUE SHIELD | Admitting: Cardiology

## 2015-07-03 NOTE — Progress Notes (Signed)
Error

## 2015-07-07 ENCOUNTER — Ambulatory Visit (INDEPENDENT_AMBULATORY_CARE_PROVIDER_SITE_OTHER): Payer: Commercial Managed Care - PPO | Admitting: Nurse Practitioner

## 2015-07-07 ENCOUNTER — Encounter: Payer: Self-pay | Admitting: Nurse Practitioner

## 2015-07-07 VITALS — BP 128/76 | HR 71 | Ht 68.0 in | Wt 197.0 lb

## 2015-07-07 DIAGNOSIS — I10 Essential (primary) hypertension: Secondary | ICD-10-CM

## 2015-07-07 DIAGNOSIS — G4719 Other hypersomnia: Secondary | ICD-10-CM | POA: Diagnosis not present

## 2015-07-07 DIAGNOSIS — Z9889 Other specified postprocedural states: Secondary | ICD-10-CM | POA: Diagnosis not present

## 2015-07-07 DIAGNOSIS — R0683 Snoring: Secondary | ICD-10-CM

## 2015-07-07 NOTE — Patient Instructions (Addendum)
Your physician has requested that you have an echocardiogram @ [address] - [address]rd [address]. Echocardiography is a painless test that uses sound waves to create images of your heart. It provides your doctor with information about the size and shape of your heart and how well your heart's chambers and valves are working. This procedure takes approximately one hour. There are no restrictions for this procedure.  Your physician has recommended that you have a sleep study @ [facility]. This test records several body functions during sleep, including: brain activity, eye movement, oxygen and carbon dioxide blood levels, heart rate and rhythm, breathing rate and rhythm, the flow of air through your mouth and nose, snoring, body muscle movements, and chest and belly movement.  Your physician wants you to follow-up in: 1 year with Dr. Percival Spanish. You will receive a reminder letter in the mail two months in advance. If you don't receive a letter, please call our office to schedule the follow-up appointment.

## 2015-07-07 NOTE — Progress Notes (Signed)
Office Visit    Patient Name: [name] Date of Encounter: 07/07/2015  Primary Care Provider:  No PCP Per Patient Primary Cardiologist:  J. Hochrein, MD   Chief Complaint    65 year old male with a history of mitral valve repair who presents for annual follow-up.  Past Medical History    Past Medical History  Diagnosis Date  . Mitral Valve Prolapse with Severe Mitral Regurgitation     a. 01/2006 s/p MV Repair w/ 30 mm Edwards physio ring annuloplasty;  b. 06/2013 Echo: Ef 60-65%, no rwma, triv AI, dilated Ao root (55mm), mild to mod MS, mild MR, mildly dil LA.  . Obesity   . Atrial fibrillation ([place])     postoperative  . Anxiety   . BPH (benign prostatic hypertrophy)   . Urge incontinence   . Hypogonadism male 08/2010    Axiron started--?compliance? (repeat value 01/2011 mildly improved).  . Chronic otitis media     left, with chronic TM perforation; also with bilateral conductive hearing loss (sees Dr. Danne Wolfe at [facility])  . History of non anemic vitamin B12 deficiency 2012  . CKD (chronic kidney disease), stage II 2015    Borderline stage II/III: CrCl@60  ml/min  . Gout    Past Surgical History  Procedure Laterality Date  . Mitral valve repair  2007    Dr. Roxy Wolfe  . Colonoscopy  2003    Normal per pt: Dr. Anthony Wolfe in [place]  . Hemorrhoid surgery  2000    doing fine since  . Transthoracic echocardiogram  06/2012; 06/2013    moderate MR (MV repair)--stable compared to prior echo and pt is asymptomatic.  Repeat echo 06/2013: Stable MV repair.  Mild regurg.  Mild AO root dilatation. No change in therapy.    Allergies  Allergies  Allergen Reactions  . Ivp Dye [Iodinated Diagnostic Agents] Anaphylaxis  . Prednisone Other (See Comments)    Moodiness & felt "weird'    History of Present Illness    65 year old male with a prior history of mitral regurgitation status post mitral valve repair in November 2007. Most recent follow-up echo in 2015 showed mild to moderate  mitral stenosis with mild mitral regurgitation. 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 Over the past few months, he has noted some daytime somnolence, stating that sometimes he feels like he may fall asleep driving, resulting in him pulling over to take a brief nap. His wife says he hasn't been sleeping well, often falling asleep sometime around midnight, and awakening around 5 AM. He also snores loudly. He has never been evaluated for sleep apnea. He denies any history of chest pain, dyspnea, PND, orthopnea, dizziness, syncope, edema, or early satiety.  Home Medications    Prior to Admission medications   Medication Sig Start Date End Date Taking? Authorizing Provider  allopurinol (ZYLOPRIM) 100 MG tablet 2 tabs po qd 06/11/15  Yes [name] Sou, MD  aspirin 81 MG tablet Take 81 mg by mouth daily.     Yes Historical Provider, MD  Colchicine (MITIGARE) 0.6 MG CAPS Take 1 tablet by mouth at bedtime. 07/05/15  Yes Historical Provider, MD  fexofenadine (ALLEGRA) 180 MG tablet Take 1 tablet by mouth at bedtime. 06/26/15  Yes Historical Provider, MD  metoprolol succinate (TOPROL-XL) 25  MG 24 hr tablet TAKE 1/2 TABLET BY MOUTH DAILY 07/02/15  Yes [name] Breeding, MD  TOVIAZ 4 MG TB24 tablet Take 1 tablet by mouth daily. 06/23/15  Yes Historical Provider, MD  traMADol (ULTRAM) 50 MG tablet Take 1 tablet by mouth every 6 (six) hours as needed. 07/05/15  Yes Historical Provider, MD    Review of Systems    As above, he has been expressing some daytime somnolence while driving. He has never actually fallen asleep while driving. He denies chest pain, palpitations, dyspnea, PND, orthopnea, dizziness, syncope, edema, or early satiety. All other systems reviewed and are otherwise negative except as noted above.  Physical Exam    VS:  BP  128/76 mmHg  Pulse 71  Ht 5\' 8"  (1.727 m)  Wt 197 lb (89.359 kg)  BMI 29.96 kg/m2 , BMI Body mass index is 29.96 kg/(m^2). GEN: Well nourished, well developed, in no acute distress. HEENT: normal. Neck: Supple, obese difficult to gauge jvp, no carotid bruits, or masses. Cardiac: RRR, no murmurs, rubs, or gallops. No clubbing, cyanosis, edema.  Radials/DP/PT 2+ and equal bilaterally.  Respiratory:  Respirations regular and unlabored, clear to auscultation bilaterally. GI: Soft, nontender, nondistended, BS + x 4. MS: no deformity or atrophy. Skin: warm and dry, no rash. Neuro:  Strength and sensation are intact. Psych: Normal affect.  Accessory Clinical Findings    ECG - Regular sinus rhythm, 71, no acute ST or T changes.  Assessment & Plan    1.  History of mitral regurgitation status post mitral valve repair:  Status post mitral valve repair in 2007. Last echo in 2015 showed mild to moderate mitral stenosis and mild mitral regurgitation. Over the past year, he has continued to do well, remaining reasonably active without chest pain or dyspnea. I will arrange for repeat echo within the next week or 2. He remains on beta blocker therapy.   2. Daytime somnolence/snoring: He has recently been experiencing sleepiness while driving. He does snore at night and it sounds like he is only getting 5-6 hours of sleep each evening. His wife does not believe that his been sleeping well and says he is fairly restless also. We have discussed the role of a sleep study and he is interested in pursuing this. We will schedule.  3. Essential hypertension: Blood pressure stable on beta blocker therapy.  4. Follow-up echo and sleep study. Follow-up Dr. Percival Wolfe in one year or sooner if necessary.   [name] Hodgkins, NP 07/07/2015, 9:48 AM

## 2015-07-20 ENCOUNTER — Ambulatory Visit (HOSPITAL_COMMUNITY): Payer: Commercial Managed Care - PPO | Attending: Cardiovascular Disease

## 2015-07-20 ENCOUNTER — Other Ambulatory Visit: Payer: Self-pay

## 2015-07-20 DIAGNOSIS — Z9889 Other specified postprocedural states: Secondary | ICD-10-CM | POA: Diagnosis not present

## 2015-07-20 DIAGNOSIS — I119 Hypertensive heart disease without heart failure: Secondary | ICD-10-CM | POA: Diagnosis not present

## 2015-07-20 DIAGNOSIS — I059 Rheumatic mitral valve disease, unspecified: Secondary | ICD-10-CM | POA: Diagnosis present

## 2015-07-20 DIAGNOSIS — I340 Nonrheumatic mitral (valve) insufficiency: Secondary | ICD-10-CM | POA: Insufficient documentation

## 2015-07-20 DIAGNOSIS — I351 Nonrheumatic aortic (valve) insufficiency: Secondary | ICD-10-CM | POA: Insufficient documentation

## 2015-07-20 DIAGNOSIS — I071 Rheumatic tricuspid insufficiency: Secondary | ICD-10-CM | POA: Diagnosis not present

## 2015-08-30 ENCOUNTER — Encounter (HOSPITAL_BASED_OUTPATIENT_CLINIC_OR_DEPARTMENT_OTHER): Payer: Commercial Managed Care - PPO

## 2015-10-02 ENCOUNTER — Telehealth: Payer: Self-pay | Admitting: Cardiology

## 2015-10-02 NOTE — Telephone Encounter (Signed)
No answer when dialed. 

## 2015-10-02 NOTE — Telephone Encounter (Signed)
New Message  Pt has been sick bronchitis has to go to hospital and was coughing and wheezing. No chest pains and no SOB.  Asked patient if he's contacted his PCP, pt advised PCP was not contacted.

## 2015-10-05 NOTE — Telephone Encounter (Signed)
Left message for pt to call.

## 2015-10-07 NOTE — Telephone Encounter (Signed)
Left message for pt to call.

## 2015-10-16 ENCOUNTER — Encounter (HOSPITAL_BASED_OUTPATIENT_CLINIC_OR_DEPARTMENT_OTHER): Payer: Commercial Managed Care - PPO

## 2015-12-05 ENCOUNTER — Other Ambulatory Visit: Payer: Self-pay | Admitting: Physician Assistant

## 2015-12-05 ENCOUNTER — Telehealth: Payer: Self-pay | Admitting: Physician Assistant

## 2015-12-05 MED ORDER — FUROSEMIDE 20 MG PO TABS: 20.0000 mg | ORAL_TABLET | Freq: Every day | ORAL | 3 refills | Status: DC

## 2015-12-05 NOTE — Progress Notes (Signed)
Entered in error

## 2015-12-05 NOTE — Telephone Encounter (Signed)
    He called into on call service to report some LE edema that is new. It is bilateral and no pain. No CP, SOB, orthopnea or PND. Otherwise feels fine. No recent long car/airplane rides. 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 He has a hx of mitral valve repair in 2007. Recent echo in 07/2015 showed normal LV function and well functioning Mitral Valve - no change since 2015. Also some G1DD with high ventricular filling pressures. Will call in lasix 20mg  daily and see how he does. He will call us if this doesn't help or needs to be seen.  Angelena Form PA-C  [facility]

## 2015-12-08 ENCOUNTER — Telehealth: Payer: Self-pay | Admitting: Cardiology

## 2015-12-08 NOTE — Telephone Encounter (Signed)
New message      Pt c/o swelling: STAT is pt has developed SOB within 24 hours  1. How long have you been experiencing swelling?  4-5 days  2. Where is the swelling located? Feet and ankles 3.  Are you currently taking a "fluid pill"? yes  4.  Are you currently SOB?  no 5.  Have you traveled recently? no

## 2015-12-08 NOTE — Telephone Encounter (Signed)
SPOKE TO PATIENT . STATES STILL HAS SWELLING IN  LOWER LEGS. STATES HE HAS GOUT IN THE RIGHT FOOT., BUT SWELLING IN BOOTH.  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  RECOMMEND TAKE LASIX DAILY FOR DAILY FOR THE REST OF THE WEEK . PURCHASE SUPPORT SOCKS/KNEE-HI. APPOINTMENT SCHEDULE FOR 101/10/17 WITH EXTENDER.- MAY CANCEL IF SYMPTOMS GET BETTER. PATIENT IS IN AGREEMENT AND VERBALIZED UNDERSTANDING

## 2015-12-15 ENCOUNTER — Ambulatory Visit: Payer: Commercial Managed Care - PPO | Admitting: Physician Assistant

## 2015-12-16 ENCOUNTER — Telehealth (HOSPITAL_COMMUNITY): Payer: Self-pay | Admitting: Interventional Cardiology

## 2015-12-16 NOTE — Telephone Encounter (Signed)
   In addition, patient was seen by cardiology a week ago and started on Lasix 20 mg.  He had some improvement but sx are now worsening so he called this evening.  He currently denies chest pain or SHOB.  I instructed him that if he should develop either of these sx, he should go to the ER.  Instructions given to increase Lasix, elevate legs and use compression stockings.   Jettie Booze, MD

## 2015-12-16 NOTE — Telephone Encounter (Signed)
     Patient having more swelling.  He was instructed to elevate legs at night.  Increase Lasix to 40 mg daily.  Someone should call him tomorrow.  Recommended compression stockings.     Jettie Booze, MD

## 2015-12-17 ENCOUNTER — Telehealth: Payer: Self-pay | Admitting: Cardiology

## 2015-12-17 NOTE — Telephone Encounter (Signed)
Spoke w patient and discussed lasix use. He's been having intermittent leg swelling and concerned for investigating the cause of this. He has an appt next week w Suanne Marker and aware that he should discuss concerns and potential heart related causes of swelling. He also notes a hx of gout. His leg swelling is not every day. He's not needed to take the lasix daily since it was prescribed 1 week ago. I suggested, per Dr. Hassell Done note 12/16/15, to take the 40mg  dose. Advised patient to consider continuing this dose, but if his swelling is completely resolved and he only needs 20mg  daily, to take as such and have Suanne Marker assess need for medication/dose at appt. We also discussed potential interaction/contraindications for toviaz and lasix. I spoke w Erasmo Downer, pharmD who advised no concerns other than to consider action on bladder. For this reason, I instructed patient to continue use of both but to call us if problems. Patient aware he may need to discontinue toviaz or consult w original prescriber if issues.  Pt voiced understanding and thanks for assistance.

## 2015-12-17 NOTE — Telephone Encounter (Signed)
New Message  Pt c/o medication issue:  1. Name of Medication: Lasix  2. How are you currently taking this medication (dosage and times per day)? 40mg   3. Are you having a reaction (difficulty breathing--STAT)? no  4. What is your medication issue? Pt would like to speak with RN. Pt states he would like to know more about medication and how to take it. Pt would would like to speak with RN. Please call back to discuss

## 2015-12-24 ENCOUNTER — Encounter: Payer: Self-pay | Admitting: Physician Assistant

## 2015-12-24 ENCOUNTER — Ambulatory Visit (INDEPENDENT_AMBULATORY_CARE_PROVIDER_SITE_OTHER): Payer: Commercial Managed Care - PPO | Admitting: Physician Assistant

## 2015-12-24 VITALS — BP 123/82 | HR 102 | Ht 68.0 in | Wt 201.0 lb

## 2015-12-24 DIAGNOSIS — Z79899 Other long term (current) drug therapy: Secondary | ICD-10-CM

## 2015-12-24 DIAGNOSIS — R05 Cough: Secondary | ICD-10-CM

## 2015-12-24 DIAGNOSIS — R600 Localized edema: Secondary | ICD-10-CM | POA: Diagnosis not present

## 2015-12-24 DIAGNOSIS — Z9889 Other specified postprocedural states: Secondary | ICD-10-CM

## 2015-12-24 DIAGNOSIS — R059 Cough, unspecified: Secondary | ICD-10-CM

## 2015-12-24 NOTE — Patient Instructions (Signed)
Medication Instructions:  RESTART ALLEGRA 180MG  DAILY TO SEE IF COUGH GETS BETTER ONLY TAKE LASIX AS NEEDED FOR SWELLING ONLY   Labwork: CMP TODAY FOR LASIX USE AND ABNORMAL LIVER FUNCTION IN THE PAST   Follow-Up: WITH DR HOCHREIN IN 12 MONTHS OR SOONER AS NEEDED  Any Other Special Instructions Will Be Listed Below (If Applicable). INCREASE WATER INTAKE TO 1 AND 1/2 LITERS DAILY    If you need a refill on your cardiac medications before your next appointment, please call your pharmacy.

## 2015-12-24 NOTE — Progress Notes (Signed)
Cardiology Office Note   Date:  12/25/2015   ID:  [name], DOB 1951-01-18, MRN [id number]  PCP:  Curly Rim, MD  Cardiologist:   Dr Percival Spanish 06/2014 Rosaria Ferries, PA-C   Chief Complaint  Patient presents with  . Edema    feet swelling  . Cough    History of Present Illness: [name] is a 65 y.o. male with a history of MV repair 2007, post-op afib, CKD III, GOUT, obesity, anxiety, hearing loss, BPH  Pt called in 09/30 w/ LE edema, Lasix 20 mg qd added>>increased to 40 mg qd 10/03, edema intermittent, had not been taking Lasix every day.  [name] presents for Evaluation of cough and volume status.  [name] got sick 2 months ago with bronchitis. He was coughing a great deal, cough was productive and he was wheezing. He sought medical care and was placed on clindamycin but did not complete the prescription. He was started on prednisone, but had significant side effects with that and did not take that very long.   He got some better, but has continued to have significant problems with a cough. He feels like he just cannot shake it. He has not had shortness of breath at rest, orthopnea or PND. He has had some lower extremity edema but that got better with Lasix and he is not currently having any problems with that.   He is no longer wheezing, and the cough is nonproductive. He is also having right shoulder pain that he states is from a rotator cuff that he previously tore.   He is not having chest pain. He wonders if there is any thing cardiac that is affecting his cough. He has not had palpitations.   Past Medical History:  Diagnosis Date  . Anxiety   . Atrial fibrillation ([place])    postoperative  . BPH (benign prostatic hypertrophy)   . Chronic otitis media    left, with chronic TM perforation; also with bilateral conductive hearing loss (sees Dr. [name] at [facility])  . CKD (chronic kidney disease), stage II 2015   Borderline stage II/III: CrCl@60   ml/min  . Gout   . History of non anemic vitamin B12 deficiency 2012  . Hypogonadism male 08/2010   Axiron started--?compliance? (repeat value 01/2011 mildly improved).  . Mitral Valve Prolapse with Severe Mitral Regurgitation    a. 01/2006 s/p MV Repair w/ 30 mm Edwards physio ring annuloplasty;  b. 06/2013 Echo: Ef 60-65%, no rwma, triv AI, dilated Ao root (29mm), mild to mod MS, mild MR, mildly dil LA.  . Obesity   . Urge incontinence     Past Surgical History:  Procedure Laterality Date  . COLONOSCOPY  2003   Normal per pt: Dr. Anthony Sar in [place]  . [place]   doing fine since  . MITRAL VALVE REPAIR  2007   Dr. [name]  . TRANSTHORACIC ECHOCARDIOGRAM  06/2012; 06/2013   moderate MR (MV repair)--stable compared to prior echo and pt is asymptomatic.  Repeat echo 06/2013: Stable MV repair.  Mild regurg.  Mild AO root dilatation. No change in therapy.    Current Outpatient Prescriptions  Medication Sig Dispense Refill  . allopurinol (ZYLOPRIM) 100 MG tablet 2 tabs po qd 60 tablet 2  . aspirin 81 MG tablet Take 81 mg by mouth daily.      . indomethacin (INDOCIN) 50 MG capsule Take 50 mg by mouth 3 (three) times daily.  0  .  metoprolol succinate (TOPROL-XL) 25 MG 24 hr tablet TAKE 1/2 TABLET BY MOUTH DAILY 15 tablet 6  . montelukast (SINGULAIR) 10 MG tablet Take 1 tablet by mouth daily.    Marland Kitchen omeprazole (PRILOSEC) 40 MG capsule Take 1 capsule by mouth daily.     No current facility-administered medications for this visit.     Allergies:   Ivp dye [iodinated diagnostic agents] and Prednisone    Social History:  The patient  reports that he has never smoked. He has never used smokeless tobacco. He reports that he does not drink alcohol or use drugs.   Family History:  The patient's family history includes Alcohol abuse in his father and mother; Cancer in his father.    ROS:  Please see the history of present illness. All other systems are reviewed and negative.     PHYSICAL EXAM: VS:  BP 123/82   Pulse (!) 102   Ht 5\' 8"  (1.727 m)   Wt 201 lb (91.2 kg)   BMI 30.56 kg/m  , BMI Body mass index is 30.56 kg/m. GEN: Well nourished, well developed, male in no acute distress  HEENT: normal for age  Neck: no JVDBut difficult to assess secondary to body habitus, no carotid bruit, no masses Cardiac: RRR; 2/6 murmur, no rubs, or gallops Respiratory:  Decreased breath sounds bases, the patient coughs when he takes a deep breath but he has good air exchange bilaterally, normal work of breathing GI: soft, nontender, nondistended, + BS MS: no deformity or atrophy; no edema; distal pulses are 2+ in all 4 extremities   Skin: warm and dry, no rash Neuro:  Strength and sensation are intact Psych: euthymic mood, full affect   EKG:  EKG is ordered today. The ekg ordered today demonstrates sinus rhythm, no acute ischemic changes   Recent Labs: 12/24/2015: ALT 130; BUN 15; Creat 1.20; Potassium 4.0; Sodium 142    Lipid Panel    Component Value Date/Time   CHOL 224 (H) 06/01/2012 0834   TRIG 108.0 06/01/2012 0834   HDL 28.10 (L) 06/01/2012 0834   CHOLHDL 8 06/01/2012 0834   VLDL 21.6 06/01/2012 0834   LDLDIRECT 180.9 06/01/2012 0834     Wt Readings from Last 3 Encounters:  12/24/15 201 lb (91.2 kg)  07/07/15 197 lb (89.4 kg)  09/25/14 197 lb (89.4 kg)     Other studies Reviewed: Additional studies/ records that were reviewed today include: Office notes and testing.  ASSESSMENT AND PLAN:  1.  Cough: He has no signs or symptoms of volume overload by exam. I do not hear a pneumonia or significant congestion. There is no wheeze. He was reassured that I do not see signs of infection. He was advised that he should follow-up with primary care for further treatment of the cough. I explained that because he was not able to complete the steroids, the cough may linger more than it would otherwise.  2. Mitral valve repair: He is not having signs or  symptoms of volume overload. His murmur is detectable, but not pronounced. He had an echocardiogram in May 2017 with an EF of 60-65 percent, grade 1 diastolic dysfunction and mild MR. PAS was 30.  3. Lower extremity edema: The symptoms have resolved. It is okay for him to take Lasix when necessary for edema. He is to continue his Toprol-XL and aspirin. He was supposed to get a hepatic function panel and since he has been on Lasix, we will get a complete metabolic panel to  check his electrolytes and his hepatic function.   Current medicines are reviewed at length with the patient today.  The patient does not have concerns regarding medicines.  The following changes have been made:  Okay to take Lasix 20 mg daily when necessary for edema  Labs/ tests ordered today include:   Orders Placed This Encounter  Procedures  . Comprehensive metabolic panel  . EKG 12-Lead     Disposition:   FU with Dr. Percival Spanish  Signed, Rosaria Ferries, PA-C  12/25/2015 9:25 AM    [place] Phone: [phone]; Fax: [phone]  This note was written with the assistance of speech recognition software. Please excuse any transcriptional errors.

## 2015-12-25 ENCOUNTER — Encounter: Payer: Self-pay | Admitting: Physician Assistant

## 2015-12-25 LAB — COMPREHENSIVE METABOLIC PANEL
ALBUMIN: 4.2 g/dL (ref 3.6–5.1)
ALT: 130 U/L — AB (ref 9–46)
AST: 111 U/L — ABNORMAL HIGH (ref 10–35)
Alkaline Phosphatase: 66 U/L (ref 40–115)
BUN: 15 mg/dL (ref 7–25)
CO2: 27 mmol/L (ref 20–31)
CREATININE: 1.2 mg/dL (ref 0.70–1.25)
Calcium: 9.8 mg/dL (ref 8.6–10.3)
Chloride: 105 mmol/L (ref 98–110)
Glucose, Bld: 135 mg/dL — ABNORMAL HIGH (ref 65–99)
POTASSIUM: 4 mmol/L (ref 3.5–5.3)
Sodium: 142 mmol/L (ref 135–146)
TOTAL PROTEIN: 6.9 g/dL (ref 6.1–8.1)
Total Bilirubin: 0.9 mg/dL (ref 0.2–1.2)

## 2016-02-09 ENCOUNTER — Telehealth: Payer: Self-pay | Admitting: Cardiology

## 2016-02-09 NOTE — Telephone Encounter (Signed)
Patient wanted to know if he could take Allopurinol 100 mg 2 daily. PCP wanted to increase to 2 daily but wanted to check with his cardiologist first and he has not heard anything. Advised chart already has two daily and reflected that at last ov with Suanne Marker B PA so ok to take.

## 2016-02-09 NOTE — Telephone Encounter (Signed)
New Message  Pt voiced wanting to know if he needs to up his medication dosage for his gout.  Please f/u

## 2016-02-10 NOTE — Telephone Encounter (Signed)
No cardiac contraindication.

## 2016-02-11 NOTE — Telephone Encounter (Signed)
Spoke with pt letting him know it will be ok

## 2016-02-21 ENCOUNTER — Other Ambulatory Visit: Payer: Self-pay | Admitting: Cardiology

## 2016-03-02 ENCOUNTER — Other Ambulatory Visit: Payer: Self-pay | Admitting: Cardiology

## 2016-06-10 ENCOUNTER — Encounter: Payer: Self-pay | Admitting: Cardiology

## 2016-07-04 ENCOUNTER — Encounter: Payer: Self-pay | Admitting: Cardiology

## 2016-07-04 ENCOUNTER — Ambulatory Visit (INDEPENDENT_AMBULATORY_CARE_PROVIDER_SITE_OTHER): Payer: Commercial Managed Care - PPO | Admitting: Cardiology

## 2016-07-04 VITALS — BP 128/78 | HR 77 | Ht 68.0 in | Wt 198.0 lb

## 2016-07-04 DIAGNOSIS — M159 Polyosteoarthritis, unspecified: Secondary | ICD-10-CM | POA: Insufficient documentation

## 2016-07-04 DIAGNOSIS — M150 Primary generalized (osteo)arthritis: Secondary | ICD-10-CM | POA: Diagnosis not present

## 2016-07-04 DIAGNOSIS — Z9889 Other specified postprocedural states: Secondary | ICD-10-CM

## 2016-07-04 DIAGNOSIS — G4730 Sleep apnea, unspecified: Secondary | ICD-10-CM | POA: Diagnosis not present

## 2016-07-04 DIAGNOSIS — M8949 Other hypertrophic osteoarthropathy, multiple sites: Secondary | ICD-10-CM

## 2016-07-04 HISTORY — DX: Sleep apnea, unspecified: G47.30

## 2016-07-04 NOTE — Assessment & Plan Note (Signed)
MV repair 2007-mild to moderate MS by echo may 2017

## 2016-07-04 NOTE — Progress Notes (Signed)
07/04/2016 [name]   07-11-1950  [id number]  Primary Physician Curly Rim, MD Primary Cardiologist: Dr Percival Spanish  HPI:  66 y/o male followed by Dr Percival Spanish with a history of MV repair in 2007. He has done well from acardiac standpoint since. His last echo suggested mild to moderate MS. He is asymptomatic. 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 He tells me he is now 66 y/o and he is going to retire this spring. His main complaint is poor sleep. He says he wakes up after a few hours and is "sore all over". He feels he doesn't sleep well. A sleep study has been recomended in the past but he is reluctant to proceed. He tells me his brother has sleep apnea and does well with C-pap. He also told a close friend of his died suddenly last week at home and this has weighed heavy on his mind.    Current Outpatient Prescriptions  Medication Sig Dispense Refill  . allopurinol (ZYLOPRIM) 100 MG tablet 2 tabs po qd 60 tablet 2  . aspirin 81 MG tablet Take 81 mg by mouth daily.      . metoprolol succinate (TOPROL-XL) 25 MG 24 hr tablet TAKE 1/2 TABLET BY MOUTH DAILY 15 tablet 6  . omeprazole (PRILOSEC) 40 MG capsule Take 1 capsule by mouth daily.    . TOVIAZ 4 MG TB24 tablet Take 4 mg by mouth daily.     No current facility-administered medications for this visit.     Allergies  Allergen Reactions  . Ivp Dye [Iodinated Diagnostic Agents] Anaphylaxis  . Prednisone Other (See Comments)    Moodiness & felt "weird'    Past Medical History:  Diagnosis Date  . Anxiety   . Atrial fibrillation ([place])    postoperative  . BPH (benign prostatic hypertrophy)   . Chronic otitis media    left, with chronic TM perforation; also with bilateral conductive hearing loss (sees Dr. [name] at [facility])  . CKD (chronic kidney disease), stage II 2015   Borderline stage II/III: CrCl@60  ml/min  . Gout   . History of non anemic vitamin B12 deficiency 2012  . Hypogonadism male  08/2010   Axiron started--?compliance? (repeat value 01/2011 mildly improved).  . Mitral Valve Prolapse with Severe Mitral Regurgitation    a. 01/2006 s/p MV Repair w/ 30 mm Edwards physio ring annuloplasty;  b. 06/2013 Echo: Ef 60-65%, no rwma, triv AI, dilated Ao root (57mm), mild to mod MS, mild MR, mildly dil LA.  . Obesity   . Urge incontinence     Social History   Social History  . Marital status: Married    Spouse name: N/A  . Number of children: N/A  . Years of education: N/A   Occupational History  . 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 Smoking status: Never Smoker  . Smokeless tobacco: Never Used  . Alcohol use No  . Drug use: No  . Sexual activity: Not on file   Other Topics Concern  . Not on file   Social History Narrative   Married 40 yrs.   Truck driver, delivers propane.   Has 3 grown children.   No T/A/Ds.              Family History  Problem Relation Age of Onset  . Alcohol abuse Mother   . Alcohol abuse Father   . Cancer  Father     prostate     Review of Systems: General: negative for chills, fever, night sweats or weight changes.  Cardiovascular: negative for chest pain, dyspnea on exertion, edema, orthopnea, palpitations, paroxysmal nocturnal dyspnea or shortness of breath Dermatological: negative for rash Respiratory: negative for cough or wheezing Urologic: negative for hematuria Abdominal: negative for nausea, vomiting, diarrhea, bright red blood per rectum, melena, or hematemesis Neurologic: negative for visual changes, syncope, or dizziness All other systems reviewed and are otherwise negative except as noted above.    Blood pressure 128/78, pulse 77, height 5\' 8"  (1.727 m), weight 198 lb (89.8 kg).  General appearance: alert, cooperative, no distress and mildly obese Neck: no carotid bruit and no JVD Lungs: clear to auscultation bilaterally Heart: regular rate and rhythm Extremities:  extremities normal, atraumatic, no cyanosis or edema Skin: Skin color, texture, turgor normal. No rashes or lesions Neurologic: Grossly normal  EKG NSR, LAD  ASSESSMENT AND PLAN:   DJD (degenerative joint disease), multiple sites C/O Rt shoulder arthritis, he is s/p Lt shoulder surgery in the past with good results from that.  S/P mitral valve repair MV repair 2007-mild to moderate MS by echo may 2017  Sleep apnea Suspected sleep apnea, I encouraged the pt to consider a sleep study   PLAN  We talked about sleep apnea, he will consider a study. I suggested when he retires he take up walking on a daily basis, this may help his fatigue, improve his sleep, and he may loose some weight. I also suggested he try Tylenol at night and try to avoid NSAIDs unless absolutely needed. F/U with Dr Sallyanne Kuster in one year, check echo then.   [name] Ransom PA-C 07/04/2016 3:47 PM

## 2016-07-04 NOTE — Assessment & Plan Note (Signed)
Suspected sleep apnea, I encouraged the pt to consider a sleep study

## 2016-07-04 NOTE — Assessment & Plan Note (Signed)
C/O Rt shoulder arthritis, he is s/p Lt shoulder surgery in the past with good results from that.

## 2016-07-04 NOTE — Patient Instructions (Signed)
Your physician recommends that you schedule a follow-up appointment in: 1 year with Dr. Percival Spanish.

## 2016-07-27 ENCOUNTER — Telehealth: Payer: Self-pay | Admitting: Physician Assistant

## 2016-07-27 NOTE — Telephone Encounter (Signed)
Paged by answering service, patient having bilateral leg swelling L > R. He just noted. Standing make discoloration to back. No dyspnea, chest pain, palpitations, dizziness, orthopnea, PND, syncope. No injury. Seems not due to CHF. Advised to go ER. He will call his PCP tomorrow for appointment. No pain with walking. No redness.

## 2016-11-07 ENCOUNTER — Other Ambulatory Visit: Payer: Self-pay | Admitting: Cardiology

## 2016-11-08 NOTE — Telephone Encounter (Signed)
REFILL 

## 2016-11-22 ENCOUNTER — Telehealth: Payer: Self-pay | Admitting: Cardiology

## 2016-11-22 NOTE — Telephone Encounter (Signed)
New message    Pt c/o swelling: STAT is pt has developed SOB within 24 hours  1. How long have you been experiencing swelling? 2 weeks  2. Where is the swelling located? legs  3.  Are you currently taking a "fluid pill"? no  4.  Are you currently SOB? no  5.  Have you traveled recently? 2 hour trip to visit family    Pt wife states that pt is also very tired. appt schedule for Thursday 11/24/16 840am.

## 2016-11-22 NOTE — Telephone Encounter (Signed)
Returned the call to the patient. He stated that he has had bilateral lower extremity edema off and on for the last two weeks. He is currently a Administrator. He stated that he weighs roughly 200 pounds and has not gained any weight. He does not weigh everyday. He denies shortness of breath and chest pain. He stated that feels overall bad, no energy and achy. He has started wheezing again but that is resolved with his breathing treatments. He has been advised to call his PCP as well but wanted an appointment with Dr. Percival Spanish. He has an appointment on 9/20.

## 2016-11-23 NOTE — Progress Notes (Signed)
Cardiology Office Note   Date:  11/24/2016   ID:  [name], DOB 10-15-1950, MRN [id number]  PCP:  Blair Heys, PA-C  Cardiologist:   Minus Breeding, MD    Chief Complaint  Patient presents with  . Fatigue      History of Present Illness: [name] is a 66 y.o. male who presents for for evaluation of lower extremity edema.  He recently called with this complaint.  He says at the end of the day he has lower extremity swelling around his sock line. The edema is gone by the morning. He doesn't have excessive shortness of breath, PND or orthopnea. 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 He doesn't have any chest pressure, neck or arm discomfort. He has very poor sleep and wakes up very fatigued. He does have snoring. She doesn't have headaches. He's had no palpitations, presyncope or syncope.     Past Medical History:  Diagnosis Date  . Anxiety   . Atrial fibrillation ([place])    postoperative  . BPH (benign prostatic hypertrophy)   . Chronic otitis media    left, with chronic TM perforation; also with bilateral conductive hearing loss (sees Dr. [name] at [facility])  . CKD (chronic kidney disease), stage II 2015   Borderline stage II/III: CrCl@60  ml/min  . Gout   . History of non anemic vitamin B12 deficiency 2012  . Hypogonadism male 08/2010   Axiron started--?compliance? (repeat value 01/2011 mildly improved).  . Mitral Valve Prolapse with Severe Mitral Regurgitation    a. 01/2006 s/p MV Repair w/ 30 mm Edwards physio ring annuloplasty;  b. 06/2013 Echo: Ef 60-65%, no rwma, triv AI, dilated Ao root (36mm), mild to mod MS, mild MR, mildly dil LA.  . Obesity   . Urge incontinence     Past Surgical History:  Procedure Laterality Date  . COLONOSCOPY  2003   Normal per pt: Dr. Anthony Sar in [place]  . [place]   doing fine since  . MITRAL VALVE REPAIR  2007   Dr. [name]  . TRANSTHORACIC ECHOCARDIOGRAM  06/2012; 06/2013   moderate MR (MV  repair)--stable compared to prior echo and pt is asymptomatic.  Repeat echo 06/2013: Stable MV repair.  Mild regurg.  Mild AO root dilatation. No change in therapy.     Current Outpatient Prescriptions  Medication Sig Dispense Refill  . allopurinol (ZYLOPRIM) 100 MG tablet 2 tabs po qd 60 tablet 2  . aspirin 81 MG tablet Take 81 mg by mouth daily.      . metoprolol succinate (TOPROL-XL) 25 MG 24 hr tablet TAKE 1/2 TABLET BY MOUTH DAILY 45 tablet 3  . omeprazole (PRILOSEC) 40 MG capsule Take 1 capsule by mouth daily.    . TOVIAZ 4 MG TB24 tablet Take 4 mg by mouth daily.     No current facility-administered medications for this visit.     Allergies:   Ivp dye [iodinated diagnostic agents] and Prednisone     ROS:  Please see the history of present illness.   Otherwise, review of systems are positive for none.   All other systems are reviewed and negative.    PHYSICAL EXAM: VS:  BP 112/82   Ht 5\' 6"  (1.676 m)   Wt 202 lb (91.6 kg)   BMI 32.60 kg/m  , BMI Body mass index is 32.6 kg/m. GENERAL:  Well appearing NECK:  No jugular venous distention, waveform within normal limits,  carotid upstroke brisk and symmetric, no bruits, no thyromegaly LUNGS:  Clear to auscultation bilaterally CHEST:  Unremarkable, well healed surgical scars.   HEART:  PMI not displaced or sustained,S1 and S2 within normal limits, no S3, no S4, no clicks, no rubs, no murmurs ABD:  Flat, positive bowel sounds normal in frequency in pitch, no bruits, no rebound, no guarding, no midline pulsatile mass, no hepatomegaly, no splenomegaly EXT:  2 plus pulses throughout, no edema, no cyanosis no clubbing    EKG:  EKG is not ordered today.   Recent Labs: 12/24/2015: ALT 130 11/24/2016: BUN 15; Creatinine, Ser 1.11; Hemoglobin 15.4; Platelets 166; Potassium 4.5; Sodium 142; TSH WILL FOLLOW    Lipid Panel    Component Value Date/Time   CHOL 224 (H) 06/01/2012 0834   TRIG 108.0 06/01/2012 0834   HDL 28.10 (L)  06/01/2012 0834   CHOLHDL 8 06/01/2012 0834   VLDL 21.6 06/01/2012 0834   LDLDIRECT 180.9 06/01/2012 0834      Wt Readings from Last 3 Encounters:  11/24/16 202 lb (91.6 kg)  07/04/16 198 lb (89.8 kg)  12/24/15 201 lb (91.2 kg)      Other studies Reviewed: Additional studies/ records that were reviewed today include: None. Review of the above records demonstrates:  Please see elsewhere in the note.     ASSESSMENT AND PLAN:   MV REPAIR:  By exam and this would not appear to be a problem. Was imaged last year. Don't think an echocardiogram is indicated at this point.  EDEMA:  I suspect this is simply from being on his feet. There is no hint of right-sided or left-sided heart failure. This is mild and he can use conservative measures.  POSSIBLE SLEEP APNEA:  He has significant fatigue and going to check a CBC TSH basic metabolic file and a sleep study. He has excessive daytime somnolence, snoring and elevated Epworth scale. Further evaluation of fatigue will be based on these results.  Current medicines are reviewed at length with the patient today.  The patient does not have concerns regarding medicines.  The following changes have been made:  no change  Labs/ tests ordered today include:   Orders Placed This Encounter  Procedures  . CBC  . TSH  . Basic Metabolic Panel (BMET)  . Split night study     Disposition:   FU with me in 3 months.     Signed, Minus Breeding, MD  11/24/2016 10:18 PM    [place]

## 2016-11-23 NOTE — Telephone Encounter (Signed)
appt 09/20 @ 8:40 am

## 2016-11-24 ENCOUNTER — Ambulatory Visit (INDEPENDENT_AMBULATORY_CARE_PROVIDER_SITE_OTHER): Payer: Commercial Managed Care - PPO | Admitting: Cardiology

## 2016-11-24 ENCOUNTER — Encounter: Payer: Self-pay | Admitting: Cardiology

## 2016-11-24 VITALS — BP 112/82 | Ht 66.0 in | Wt 202.0 lb

## 2016-11-24 DIAGNOSIS — R0683 Snoring: Secondary | ICD-10-CM

## 2016-11-24 DIAGNOSIS — R5383 Other fatigue: Secondary | ICD-10-CM | POA: Diagnosis not present

## 2016-11-24 DIAGNOSIS — Z79899 Other long term (current) drug therapy: Secondary | ICD-10-CM | POA: Diagnosis not present

## 2016-11-24 DIAGNOSIS — G4730 Sleep apnea, unspecified: Secondary | ICD-10-CM | POA: Diagnosis not present

## 2016-11-24 NOTE — Patient Instructions (Signed)
Medication Instructions:  Continue current medication  If you need a refill on your cardiac medications before your next appointment, please call your pharmacy.  Labwork: CBC, TSH, BMP HERE IN OUR OFFICE AT [facility]  Testing/Procedures: Your physician has recommended that you have a sleep study. This test records several body functions during sleep, including: brain activity, eye movement, oxygen and carbon dioxide blood levels, heart rate and rhythm, breathing rate and rhythm, the flow of air through your mouth and nose, snoring, body muscle movements, and chest and belly movement.  Follow-Up: Your physician wants you to follow-up in: 4 Months.    Thank you for choosing [facility] at [facility]!!

## 2016-11-25 LAB — BASIC METABOLIC PANEL
BUN / CREAT RATIO: 14 (ref 10–24)
BUN: 15 mg/dL (ref 8–27)
CHLORIDE: 106 mmol/L (ref 96–106)
CO2: 21 mmol/L (ref 20–29)
CREATININE: 1.11 mg/dL (ref 0.76–1.27)
Calcium: 9.5 mg/dL (ref 8.6–10.2)
GFR calc Af Amer: 80 mL/min/{1.73_m2} (ref >59)
GFR calc non Af Amer: 69 mL/min/{1.73_m2} (ref >59)
GLUCOSE: 98 mg/dL (ref 65–99)
Potassium: 4.5 mmol/L (ref 3.5–5.2)
SODIUM: 142 mmol/L (ref 134–144)

## 2016-11-25 LAB — CBC
Hematocrit: 44 % (ref 37.5–51.0)
Hemoglobin: 15.4 g/dL (ref 13.0–17.7)
MCH: 29.8 pg (ref 26.6–33.0)
MCHC: 35 g/dL (ref 31.5–35.7)
MCV: 85 fL (ref 79–97)
PLATELETS: 166 10*3/uL (ref 150–379)
RBC: 5.16 x10E6/uL (ref 4.14–5.80)
RDW: 13.2 % (ref 12.3–15.4)
WBC: 7.5 10*3/uL (ref 3.4–10.8)

## 2016-11-25 LAB — TSH: TSH: 2.66 u[IU]/mL (ref 0.450–4.500)

## 2016-11-28 ENCOUNTER — Other Ambulatory Visit: Payer: Self-pay | Admitting: Cardiology

## 2016-12-31 ENCOUNTER — Ambulatory Visit (HOSPITAL_BASED_OUTPATIENT_CLINIC_OR_DEPARTMENT_OTHER): Payer: Commercial Managed Care - PPO | Attending: Cardiology | Admitting: Cardiovascular Disease

## 2016-12-31 VITALS — Ht 66.0 in | Wt 200.0 lb

## 2016-12-31 DIAGNOSIS — G4761 Periodic limb movement disorder: Secondary | ICD-10-CM | POA: Insufficient documentation

## 2016-12-31 DIAGNOSIS — G4733 Obstructive sleep apnea (adult) (pediatric): Secondary | ICD-10-CM | POA: Diagnosis not present

## 2016-12-31 DIAGNOSIS — G4730 Sleep apnea, unspecified: Secondary | ICD-10-CM

## 2016-12-31 DIAGNOSIS — R0683 Snoring: Secondary | ICD-10-CM | POA: Diagnosis present

## 2016-12-31 DIAGNOSIS — R5383 Other fatigue: Secondary | ICD-10-CM | POA: Insufficient documentation

## 2017-01-14 ENCOUNTER — Encounter (HOSPITAL_BASED_OUTPATIENT_CLINIC_OR_DEPARTMENT_OTHER): Payer: Self-pay | Admitting: Cardiovascular Disease

## 2017-01-14 NOTE — Procedures (Signed)
Patient Name: [name], [name] Date: 12/31/2016 Gender: Male D.O.B: [birth year] Age (years): 66 Referring Provider: Minus Breeding Height (inches): 47 Interpreting Physician: Shelva Majestic MD, ABSM Weight (lbs): 200 RPSGT: Earney [place] BMI: 32 MRN: [id number] Neck Size: 19.00  CLINICAL INFORMATION Sleep Study Type: NPSG  Indication for sleep study: Fatigue, OSA, Snoring  Epworth Sleepiness Score: 10  SLEEP STUDY TECHNIQUE As per the AASM Manual for the Scoring of Sleep and Associated Events v2.3 (April 2016) with a hypopnea requiring 4% desaturations.  The channels recorded and monitored were frontal, central and occipital EEG, electrooculogram (EOG), submentalis EMG (chin), nasal and oral airflow, thoracic and abdominal wall motion, anterior tibialis EMG, snore microphone, electrocardiogram, and pulse oximetry.  MEDICATIONS *    allopurinol (ZYLOPRIM) 100 MG tablet    2 tabs po qd    60 tablet    2 *    aspirin 81 MG tablet    Take 81 mg by mouth daily.             *    metoprolol succinate (TOPROL-XL) 25 MG 24 hr tablet    TAKE 1/2 TABLET BY MOUTH DAILY     *    omeprazole (PRILOSEC) 40 MG capsule    Take 1 capsule by mouth daily.           *    TOVIAZ 4 MG TB24 tablet    Take 4 mg by mouth daily.          Medications self-administered by patient taken the night of the study : N/A  SLEEP ARCHITECTURE The study was initiated at 9:55:26 PM and ended at 4:25:29 AM.  Sleep onset time was 30.8 minutes and the sleep efficiency was 76.2%. The total sleep time was 297.2 minutes. Wake after sleep onset (WASO) was 62 minutes  Stage REM latency was 97.5 minutes.  The patient spent 2.36% of the night in stage N1 sleep, 78.64% in stage N2 sleep, 0.00% in stage N3 and 19.01% in REM.  Alpha intrusion was absent.  Supine sleep was 9.08%.  RESPIRATORY PARAMETERS The overall apnea/hypopnea index (AHI) was 10.7 per hour.   The respiratory disturbance index (RDI) was  12.1 per hour. There were 28 total apneas, including 27 obstructive, 1 central and 0 mixed apneas. There were 25 hypopneas and 7 RERAs.  The AHI during Stage REM sleep was 44.6 per hour.  AHI while supine was 0.0 per hour.  The mean oxygen saturation was 93.52%. The minimum SpO2 during sleep was 73.00%.  loud snoring was noted during this study.  CARDIAC DATA The 2 lead EKG demonstrated sinus rhythm. The mean heart rate was 64.51 beats per minute. Other EKG findings include: None.  LEG MOVEMENT DATA The total PLMS were 356 with a resulting PLMS index of 71.87. Associated arousal with leg movement index was 5.0 .  IMPRESSIONS - Mild obstructive sleep apnea overall with an AHI of 10.7/h; however, sleep apnea was severe during REM sleep with an AHI of 44.6/h. - No significant central sleep apnea occurred during this study (CAI = 0.2/h). - Severe oxygen desaturation to a nadir of 73% during REM sleep. - The patient snored with loud snoring volume. - No cardiac abnormalities were noted during this study. - Severe periodic limb movements of sleep occurred during the study. Associated arousals were significant.  DIAGNOSIS - Obstructive Sleep Apnea (327.23 [G47.33 ICD-10]) - Nocturnal Hypoxemia (327.26 [G47.36 ICD-10]) - Periodic limb movement disorder of sleep  RECOMMENDATIONS - Therapeutic  CPAP titration to determine optimal pressure required to alleviate sleep disordered breathing. - Effort should be made to optimize nasal and pharyngeal patency - Avoid alcohol, sedatives and other CNS depressants that may worsen sleep apnea and disrupt normal sleep architecture. - Sleep hygiene should be reviewed to assess factors that may improve sleep quality. - Weight management (BMI 32) and regular exercise should be initiated or continued if appropriate.  [Electronically signed] 01/14/2017 06:04 PM  Shelva Majestic MD, [facility], ABSM Diplomate, American Board of Sleep Medicine   NPI:  [id number] [place] PH: [phone]   FX: [phone] [place]

## 2017-01-18 ENCOUNTER — Telehealth: Payer: Self-pay | Admitting: *Deleted

## 2017-01-18 DIAGNOSIS — G4733 Obstructive sleep apnea (adult) (pediatric): Secondary | ICD-10-CM

## 2017-01-18 NOTE — Telephone Encounter (Signed)
Patient aware and verbalized understanding.  CPAP titration ordered and message sent to pre cert.   Patient aware someone will call to schedule once approved by insurance.

## 2017-01-18 NOTE — Telephone Encounter (Signed)
-----   Message from Troy Sine, MD sent at 01/14/2017  6:09 PM EST ----- Daniell Mancinas  , please notify patient of the results of the sleep study.  Patient needs to be scheduled for CPAP titration trial.

## 2017-01-18 NOTE — Progress Notes (Signed)
See telephone note 11/14

## 2017-03-17 ENCOUNTER — Encounter (HOSPITAL_BASED_OUTPATIENT_CLINIC_OR_DEPARTMENT_OTHER): Payer: Commercial Managed Care - PPO

## 2017-08-29 NOTE — Progress Notes (Signed)
Cardiology Office Note   Date:  08/31/2017   ID:  [name], DOB 03-22-1950, MRN [id number]  PCP:  Blair Heys, PA-C  Cardiologist:   Minus Breeding, MD    Chief Complaint  Patient presents with  . Fatigue  . Cough      History of Present Illness: [name] is a 67 y.o. male who presents for for follow up of lower extremity edema and MR. he is been retired but is thinking of going back part-time.  He is worked in a very physical job driving a Buyer, retail truck.  When he was doing his he would get very fatigued.  He is not really having that since he retired.  He denies any chest pressure, neck or arm discomfort.  Prior to getting his mitral valve repaired he had significant shortness of breath but he does not really have this anymore.  He does not describe PND or orthopnea.  He is not having any palpitations, presyncope or syncope.  His wife is concerned that he will be fatigued again when he goes back to work.  Of note he has severe sleep apnea with periodic limb movements but he never really completed the evaluation or treatment.  Past Medical History:  Diagnosis Date  . Anxiety   . Atrial fibrillation ([place])    postoperative  . BPH (benign prostatic hypertrophy)   . Chronic otitis media    left, with chronic TM perforation; also with bilateral conductive hearing loss (sees Dr. [name] at [facility])  . CKD (chronic kidney disease), stage II 2015   Borderline stage II/III: CrCl@60  ml/min  . Gout   . History of non anemic vitamin B12 deficiency 2012  . Hypogonadism male 08/2010   Axiron started--?compliance? (repeat value 01/2011 mildly improved).  . Mitral regurgitation 04/30/2012  . Mitral Valve Prolapse with Severe Mitral Regurgitation    a. 01/2006 s/p MV Repair w/ 30 mm Edwards physio ring annuloplasty;  b. 06/2013 Echo: Ef 60-65%, no rwma, triv AI, dilated Ao root (44mm), mild to mod MS, mild MR, mildly dil LA.  . Obesity   . Sleep apnea 07/04/2016  . Urge incontinence   .  Viral URI 02/18/2014    Past Surgical History:  Procedure Laterality Date  . COLONOSCOPY  2003   Normal per pt: Dr. Anthony Sar in [place]  . [place]   doing fine since  . MITRAL VALVE REPAIR  2007   Dr. [name]  . TRANSTHORACIC ECHOCARDIOGRAM  06/2012; 06/2013   moderate MR (MV repair)--stable compared to prior echo and pt is asymptomatic.  Repeat echo 06/2013: Stable MV repair.  Mild regurg.  Mild AO root dilatation. No change in therapy.     Current Outpatient Medications  Medication Sig Dispense Refill  . allopurinol (ZYLOPRIM) 100 MG tablet 2 tabs po qd 60 tablet 2  . aspirin 81 MG tablet Take 81 mg by mouth daily.      . metoprolol succinate (TOPROL-XL) 25 MG 24 hr tablet TAKE 1/2 TABLET BY MOUTH DAILY 45 tablet 3  . omeprazole (PRILOSEC) 40 MG capsule Take 1 capsule by mouth daily.     No current facility-administered medications for this visit.     Allergies:   Ivp dye [iodinated diagnostic agents] and Prednisone     ROS:  Please see the history of present illness.   Otherwise, review of systems are positive for none.   All other systems are reviewed and negative.    PHYSICAL  EXAM: VS:  BP 106/78 (BP Location: Left Arm, Patient Position: Sitting, Cuff Size: Normal)   Pulse 79   Ht 5\' 6"  (1.676 m)   Wt 200 lb (90.7 kg)   BMI 32.28 kg/m  , BMI Body mass index is 32.28 kg/m.  GENERAL:  Well appearing NECK:  No jugular venous distention, waveform within normal limits, carotid upstroke brisk and symmetric, no bruits, no thyromegaly LUNGS:  Clear to auscultation bilaterally CHEST:  Unremarkable HEART:  PMI not displaced or sustained,S1 and S2 within normal limits, no S3, no S4, no clicks, no rubs, no murmurs ABD:  Flat, positive bowel sounds normal in frequency in pitch, no bruits, no rebound, no guarding, no midline pulsatile mass, no hepatomegaly, no splenomegaly EXT:  2 plus pulses throughout, no edema, no cyanosis no clubbing    EKG:  EKG is  ordered  today. Sinus rhythm, rate 79, axis within normal limits, intervals within normal limits, incomplete right bundle branch block, leftward axis, nonspecific anterior T wave changes.  Recent Labs: 11/24/2016: BUN 15; Creatinine, Ser 1.11; Hemoglobin 15.4; Platelets 166; Potassium 4.5; Sodium 142; TSH 2.660    Lipid Panel    Component Value Date/Time   CHOL 224 (H) 06/01/2012 0834   TRIG 108.0 06/01/2012 0834   HDL 28.10 (L) 06/01/2012 0834   CHOLHDL 8 06/01/2012 0834   VLDL 21.6 06/01/2012 0834   LDLDIRECT 180.9 06/01/2012 0834      Wt Readings from Last 3 Encounters:  08/31/17 200 lb (90.7 kg)  12/31/16 200 lb (90.7 kg)  11/24/16 202 lb (91.6 kg)      Other studies Reviewed: Additional studies/ records that were reviewed today include: sleep study. Review of the above records demonstrates:     ASSESSMENT AND PLAN:   MV REPAIR:    I would not suspect any worsening or regurgitation.  I would suspect stable repair.  He has no symptoms unless he is actively working on his physical job at which time his symptom is not dyspnea but fatigue.  At this point I do not think further imaging is indicated.  EDEMA:    This is mild and has resolved since he is not on his feet daily.  SLEEP APNEA:  He was found to have this and period limb movement.   However, he did not have the appropriate follow-up.  I did have our sleep nurse to talk to him and he will have to have the study redone because it is been so many months.  He agrees to do this.  I think this will help with his fatigue.  COUGH:  He reports sinus drainage.  I like him to talk with his primary provider about this.  And if his cough does not resolve I could reevaluate.  Current medicines are reviewed at length with the patient today.  The patient does not have concerns regarding medicines.  The following changes have been made:  None  Labs/ tests ordered today include: None  Orders Placed This Encounter  Procedures  . EKG  12-Lead     Disposition:   FU with me in 12 months.     Signed, Minus Breeding, MD  08/31/2017 11:34 AM    [facility]

## 2017-08-31 ENCOUNTER — Ambulatory Visit (INDEPENDENT_AMBULATORY_CARE_PROVIDER_SITE_OTHER): Payer: Medicare Other | Admitting: Cardiology

## 2017-08-31 ENCOUNTER — Encounter: Payer: Self-pay | Admitting: Cardiology

## 2017-08-31 ENCOUNTER — Other Ambulatory Visit: Payer: Self-pay | Admitting: Cardiology

## 2017-08-31 VITALS — BP 106/78 | HR 79 | Ht 66.0 in | Wt 200.0 lb

## 2017-08-31 DIAGNOSIS — I340 Nonrheumatic mitral (valve) insufficiency: Secondary | ICD-10-CM

## 2017-08-31 DIAGNOSIS — G4730 Sleep apnea, unspecified: Secondary | ICD-10-CM | POA: Diagnosis not present

## 2017-08-31 DIAGNOSIS — G4733 Obstructive sleep apnea (adult) (pediatric): Secondary | ICD-10-CM

## 2017-08-31 NOTE — Patient Instructions (Signed)
Medication Instructions: Your physician recommends that you continue on your current medications as directed. Please refer to the Current Medication list given to you today.   Testing/Procedures: Your sleep study will be scheduled by Mariann Laster, CMA.  Follow-Up: Your physician wants you to follow-up in: 1 year with Dr. Percival Spanish. You will receive a reminder letter in the mail two months in advance. If you don't receive a letter, please call our office to schedule the follow-up appointment.  If you need a refill on your cardiac medications before your next appointment, please call your pharmacy.

## 2017-09-24 ENCOUNTER — Ambulatory Visit (HOSPITAL_BASED_OUTPATIENT_CLINIC_OR_DEPARTMENT_OTHER): Payer: Medicare Other

## 2017-09-27 ENCOUNTER — Ambulatory Visit (HOSPITAL_BASED_OUTPATIENT_CLINIC_OR_DEPARTMENT_OTHER): Payer: Medicare Other | Attending: Cardiology | Admitting: Cardiovascular Disease

## 2017-09-27 VITALS — Ht 67.0 in | Wt 200.0 lb

## 2017-09-27 DIAGNOSIS — G4733 Obstructive sleep apnea (adult) (pediatric): Secondary | ICD-10-CM

## 2017-10-08 ENCOUNTER — Encounter (HOSPITAL_BASED_OUTPATIENT_CLINIC_OR_DEPARTMENT_OTHER): Payer: Self-pay | Admitting: Cardiovascular Disease

## 2017-10-08 NOTE — Procedures (Signed)
Patient Name: [name], [name] Date: 09/27/2017 Gender: Male D.O.B: [birth year] Age (years): 67 Referring Provider: Minus Breeding Height (inches): 37 Interpreting Physician: Shelva Majestic MD, ABSM Weight (lbs): 200 RPSGT: Laren Everts BMI: 31 MRN: [id number] Neck Size: 17.50  CLINICAL INFORMATION Sleep Study Type: Split Night CPAP  Indication for sleep study: Excessive Daytime Sleepiness, Fatigue, Obesity, OSA, Sleep walking/talking/parasomnias, Witnessed Apneas  Epworth Sleepiness Score: 4  SLEEP STUDY TECHNIQUE As per the AASM Manual for the Scoring of Sleep and Associated Events v2.3 (April 2016) with a hypopnea requiring 4% desaturations.  The channels recorded and monitored were frontal, central and occipital EEG, electrooculogram (EOG), submentalis EMG (chin), nasal and oral airflow, thoracic and abdominal wall motion, anterior tibialis EMG, snore microphone, electrocardiogram, and pulse oximetry. Continuous positive airway pressure (CPAP) was initiated when the patient met split night criteria and was titrated according to treat sleep-disordered breathing.  MEDICATIONS     allopurinol (ZYLOPRIM) 100 MG tablet         aspirin 81 MG tablet         metoprolol succinate (TOPROL-XL) 25 MG 24 hr tablet         omeprazole (PRILOSEC) 40 MG capsule (Expired)      Medications self-administered by patient taken the night of the study : N/A  RESPIRATORY PARAMETERS Diagnostic  Total AHI (/hr): 15.8 RDI (/hr): 18.1 OA Index (/hr): 10.7 CA Index (/hr): 0.0 REM AHI (/hr): 65.7 NREM AHI (/hr): 2.9 Supine AHI (/hr): 0.0 Non-supine AHI (/hr): 19.3 Min O2 Sat (%): 78.0 Mean O2 (%): 93.1 Time below 88% (min): 6.5   Titration Optimal Pressure (cm): 6 AHI at Optimal Pressure (/hr): 0.0 Min O2 at Optimal Pressure (%): 92.0 Supine % at Optimal (%): 0 Sleep % at Optimal (%): 99   SLEEP ARCHITECTURE The recording time for the entire night was 415.1 minutes.  During a baseline  period of 176.6 minutes, the patient slept for 129.5 minutes in REM and nonREM, yielding a sleep efficiency of 73.3%%. Sleep onset after lights out was 44.5 minutes with a REM latency of 104.0 minutes. The patient spent 5.8%% of the night in stage N1 sleep, 73.4%% in stage N2 sleep, 0.4%% in stage N3 and 20.5% in REM.  During the titration period of 230.3 minutes, the patient slept for 141.5 minutes in REM and nonREM, yielding a sleep efficiency of 61.4%%. Sleep onset after CPAP initiation was 54.2 minutes with a REM latency of 22.0 minutes. The patient spent 9.5%% of the night in stage N1 sleep, 64.0%% in stage N2 sleep, 0.0%% in stage N3 and 26.5% in REM.  CARDIAC DATA The 2 lead EKG demonstrated sinus rhythm. The mean heart rate was 100.0 beats per minute. Other EKG findings include: PVCs.  LEG MOVEMENT DATA The total Periodic Limb Movements of Sleep (PLMS) were 173 with an index of 80.2. The limb movement with arousal index was 2,8.  IMPRESSIONS - Moderate obstructive sleep apnea overall during the diagnostic portion of the study (AHI 15.8/h; RDI 18.1/h); however, sleep apnea was very severe during REM sleep (AHI 65.7/h). CPAP was initiated at 5 cm and was titrated to 6 cm water.  AHI at 6 cwp was 0; oxygen nadir 92%. - No significant central sleep apnea occurred during the diagnostic portion of the study (CAI = 0.0/hour). - Significant oxygen desaturation during the diagnostic portion of the study to a nadir of 78%.  - Periodic limb movement disorder of sleep.  - The patient snored with moderate snoring  volume during the diagnostic portion of the study. - EKG findings include PVCs. - Clinically significant periodic limb movements did not occur during sleep.  DIAGNOSIS - Obstructive Sleep Apnea (327.23 [G47.33 ICD-10]) - PLMS  RECOMMENDATIONS - Recommend an initial trial of CPAP Auto therapy at 6-10 cm H2O with heated humidification. A Large size Philips Respironics Full Face Mask  Dreamwear mask was used for the titration. - Efforts should be made to optimize nasal and oropharyngeal patency.  - If patient is symptomatic with restless legs on CPAP therapy consider a trial of pharmacotherapy. - Avoid alcohol, sedatives and other CNS depressants that may worsen sleep apnea and disrupt normal sleep architecture. - Sleep hygiene should be reviewed to assess factors that may improve sleep quality. - Weight management and regular exercise should be initiated or continued. - Recommend a download be obtained in 30 days and sleep clinic evaluation after 4 weeks of therapy.  [Electronically signed] 10/08/2017 02:45 PM  Shelva Majestic MD, [facility], ABSM Diplomate, American Board of Sleep Medicine   NPI: [id number] [place] PH: [phone]   FX: [phone] [place]

## 2017-10-09 ENCOUNTER — Telehealth: Payer: Self-pay | Admitting: *Deleted

## 2017-10-09 NOTE — Telephone Encounter (Signed)
-----   Message from Troy Sine, MD sent at 10/08/2017  2:52 PM EDT ----- [name], please notify pt and set up with DME for CPAP set up and f/u sleep clinic

## 2017-10-09 NOTE — Telephone Encounter (Signed)
Left message to return a call to discuss sleep study. 

## 2017-10-13 ENCOUNTER — Telehealth: Payer: Self-pay | Admitting: Cardiology

## 2017-10-13 NOTE — Telephone Encounter (Signed)
New message: D      Pt's wife is calling reference to the pt's sleep study results.

## 2017-10-16 NOTE — Telephone Encounter (Signed)
-----   Message from Troy Sine, MD sent at 10/08/2017  2:52 PM EDT ----- Mariann Laster, please notify pt and set up with DME for CPAP set up and f/u sleep clinic

## 2017-10-16 NOTE — Progress Notes (Signed)
Patient's wife notified. Referral sent to [facility].

## 2017-10-16 NOTE — Telephone Encounter (Signed)
Patient's wife notified of sleep study results and recommendations. CPAP referral made to [facility]. Their information was given to patient's wife to call for status if she hasn't heard from them in 7-10 days.

## 2017-11-21 ENCOUNTER — Encounter: Payer: Self-pay | Admitting: Internal Medicine

## 2017-11-21 ENCOUNTER — Ambulatory Visit (INDEPENDENT_AMBULATORY_CARE_PROVIDER_SITE_OTHER): Payer: Medicare Other | Admitting: Internal Medicine

## 2017-11-21 ENCOUNTER — Other Ambulatory Visit (INDEPENDENT_AMBULATORY_CARE_PROVIDER_SITE_OTHER): Payer: Medicare Other

## 2017-11-21 ENCOUNTER — Ambulatory Visit (INDEPENDENT_AMBULATORY_CARE_PROVIDER_SITE_OTHER)
Admission: RE | Admit: 2017-11-21 | Discharge: 2017-11-21 | Disposition: A | Discharge status: Home / Self Care | Payer: Medicare Other | Source: Ambulatory Visit | Attending: Internal Medicine | Admitting: Internal Medicine

## 2017-11-21 VITALS — BP 122/80 | HR 81 | Ht 65.5 in | Wt 205.0 lb

## 2017-11-21 DIAGNOSIS — R058 Other specified cough: Secondary | ICD-10-CM

## 2017-11-21 DIAGNOSIS — R05 Cough: Secondary | ICD-10-CM | POA: Diagnosis not present

## 2017-11-21 LAB — CBC WITH DIFFERENTIAL/PLATELET
BASOS ABS: 0.2 10*3/uL — AB (ref 0.0–0.1)
Basophils Relative: 2.2 % (ref 0.0–3.0)
EOS PCT: 4.2 % (ref 0.0–5.0)
Eosinophils Absolute: 0.4 10*3/uL (ref 0.0–0.7)
HCT: 46 % (ref 39.0–52.0)
Hemoglobin: 16 g/dL (ref 13.0–17.0)
LYMPHS ABS: 3.7 10*3/uL (ref 0.7–4.0)
Lymphocytes Relative: 37.2 % (ref 12.0–46.0)
MCHC: 34.8 g/dL (ref 30.0–36.0)
MCV: 84.9 fl (ref 78.0–100.0)
MONO ABS: 0.7 10*3/uL (ref 0.1–1.0)
Monocytes Relative: 7 % (ref 3.0–12.0)
NEUTROS ABS: 4.9 10*3/uL (ref 1.4–7.7)
NEUTROS PCT: 49.4 % (ref 43.0–77.0)
PLATELETS: 206 10*3/uL (ref 150.0–400.0)
RBC: 5.42 Mil/uL (ref 4.22–5.81)
RDW: 13.1 % (ref 11.5–15.5)
WBC: 10 10*3/uL (ref 4.0–10.5)

## 2017-11-21 MED ORDER — PANTOPRAZOLE SODIUM 40 MG PO TBEC: 40.0000 mg | DELAYED_RELEASE_TABLET | Freq: Every day | ORAL | 2 refills | Status: DC

## 2017-11-21 MED ORDER — FAMOTIDINE 20 MG PO TABS: ORAL_TABLET | ORAL | 11 refills | Status: DC

## 2017-11-21 NOTE — Patient Instructions (Addendum)
Stop loratadine and start zyrtec 10 mg one at bedtime instead   Pantoprazole (protonix) 40 mg   Take  30-60 min before first meal of the day and Pepcid (famotidine)  20 mg one after supper   until return to office - this is the best way to tell whether stomach acid is contributing to your problem.  GERD (REFLUX)  is an extremely common cause of respiratory symptoms just like yours , many times with no obvious heartburn at all.    It can be treated with medication, but also with lifestyle changes including elevation of the head of your bed (ideally with 6 inch  bed blocks),  Smoking cessation, avoidance of late meals, excessive alcohol, and avoid fatty foods, chocolate, peppermint, colas, red wine, and acidic juices such as orange juice.  NO MINT OR MENTHOL PRODUCTS SO NO COUGH DROPS   USE SUGARLESS CANDY INSTEAD (Jolley ranchers or Stover's or Life Savers) or even ice chips will also do - the key is to swallow to prevent all throat clearing. NO OIL BASED VITAMINS - use powdered substitutes.    Please remember to go to the lab and x-ray department downstairs in the basement  for your tests - we will call you with the results when they are available.   Please schedule a follow up office visit in 4 weeks, sooner if needed  with all medications /inhalers/ solutions in hand so we can verify exactly what you are taking. This includes all medications from all doctors and over the counters

## 2017-11-21 NOTE — Progress Notes (Signed)
[name], male    DOB: 01-14-1951,   MRN: [id number]   Brief patient profile:  1 yowm never smoker from [place] perfectly healthy until new cough around fall 2017 - symptoms daily then sob/ wheeze 2018 no better with inhalers or steroids  So self-referred 11/21/2017     History of Present Illness  11/21/2017  Pulmonary / 1st office eval  Chief Complaint  Patient presents with  . Pulmonary Consult    Self referral.  Pt c/o cough for the past 2 yrs, worse with wheezing over the past 2 months. He occ will produce some clear sputum.   Dyspnea:  Not limited by breathing from desired activities   Cough: see below  Sleep: cough better when sleeping on 2 pillows / cpap started 11/10/17  SABA use: no better Some am excess mucus runny out of nose      [name] v Neurogenic Cough Differentiator Reflux Comments  Do you awaken from a sound sleep coughing violently?                            With trouble breathing? no   Do you have choking episodes when you cannot  Get enough air, gasping for air ?              Sometimes like gagging but no vomit   Do you usually cough when you lie down into  The bed, or when you just lie down to rest ?                          no   Do you usually cough after meals or eating?         No    Do you cough when (or after) you bend over?    no   GERD SCORE     [name] v Neurogenic Cough Differentiator Neurogenic   Do you more-or-less cough all day long? Pretty constant   Does change of temperature make you cough? no   Does laughing or chuckling cause you to cough? yes   Do fumes (perfume, automobile fumes, burned  Toast, etc.,) cause you to cough ?      No    Does speaking, singing, or talking on the phone cause you to cough   ?               no   Neurogenic/Airway score        No obvious other patterns in day to day or daytime variability or assoc  purulent sputum or mucus plugs or hemoptysis or cp or chest tightness,   or overt sinus or hb  symptoms.     Also denies any obvious fluctuation of symptoms with weather or environmental changes or other aggravating or alleviating factors except as outlined above   No unusual exposure hx or h/o childhood pna/ asthma or knowledge of premature birth.  Current Allergies, Complete Past Medical History, Past Surgical History, Family History, and Social History were reviewed in [employer] record.  ROS  The following are not active complaints unless bolded Hoarseness, sore throat, dysphagia, dental problems, itching, sneezing,  nasal congestion or discharge of excess mucus or purulent secretions, ear ache,   fever, chills, sweats, unintended wt loss or wt gain, classically pleuritic or exertional cp,  orthopnea pnd or arm/hand swelling  or leg swelling, presyncope, palpitations, abdominal pain, anorexia,  nausea, vomiting, diarrhea  or change in bowel habits or change in bladder habits, change in stools or change in urine, dysuria, hematuria,  rash, arthralgias, visual complaints, headache, numbness, weakness or ataxia or problems with walking or coordination,  change in mood or  memory.             Past Medical History:  Diagnosis Date  . Anxiety   . Atrial fibrillation ([place])    postoperative  . BPH (benign prostatic hypertrophy)   . Chronic otitis media    left, with chronic TM perforation; also with bilateral conductive hearing loss (sees Dr. [name] at [facility])  . CKD (chronic kidney disease), stage II 2015   Borderline stage II/III: CrCl@60  ml/min  . Gout   . History of non anemic vitamin B12 deficiency 2012  . Hypogonadism male 08/2010   Axiron started--?compliance? (repeat value 01/2011 mildly improved).  . Mitral regurgitation 04/30/2012  . Mitral Valve Prolapse with Severe Mitral Regurgitation    a. 01/2006 s/p MV Repair w/ 30 mm Edwards physio ring annuloplasty;  b. 06/2013 Echo: Ef 60-65%, no rwma, triv AI, dilated Ao root (47mm), mild to mod MS, mild MR,  mildly dil LA.  . Obesity   . Sleep apnea 07/04/2016  . Urge incontinence   . Viral URI 02/18/2014    Outpatient Medications Prior to Visit  Medication Sig Dispense Refill  . aspirin 81 MG tablet Take 81 mg by mouth daily.      Marland Kitchen loratadine (ALLERGY) 10 MG tablet Take 10 mg by mouth daily.    . metoprolol succinate (TOPROL-XL) 25 MG 24 hr tablet TAKE 1/2 TABLET BY MOUTH DAILY 45 tablet 3  . UNABLE TO FIND Med Name: CPAP with sleep    . allopurinol (ZYLOPRIM) 100 MG tablet 2 tabs po qd 60 tablet 2  .                   Objective:     BP 122/80 (BP Location: Left Arm, Cuff Size: Normal)   Pulse 81   Ht 5' 5.5" (1.664 m)   Wt 205 lb (93 kg)   SpO2 94%   BMI 33.59 kg/m   SpO2: 94 % RA   Wt Readings from Last 3 Encounters:  11/21/17 205 lb (93 kg)  09/27/17 200 lb (90.7 kg)  08/31/17 200 lb (90.7 kg)       HEENT: nl dentition, turbinates bilaterally, and oropharynx. Nl external ear canals without cough reflex   NECK :  without JVD/Nodes/TM/ nl carotid upstrokes bilaterally   LUNGS: no acc muscle use,  Nl contour chest which is clear to A and P bilaterally without cough on insp or exp maneuvers   CV:  RRR  no s3 or murmur or increase in P2, and no edema   ABD:  Obese soft and nontender with nl inspiratory excursion in the supine position. No bruits or organomegaly appreciated, bowel sounds nl  MS:  Nl gait/ ext warm without deformities, calf tenderness, cyanosis or clubbing No obvious joint restrictions   SKIN: warm and dry without lesions    NEURO:  alert, approp, nl sensorium with  no motor or cerebellar deficits apparent.     CXR PA and Lateral:   11/21/2017 :    I personally reviewed images and   impression as follows:   Mild cm/ no acute changes   Labs ordered 11/21/2017  Allergy profile      Assessment   No problem-specific Assessment &  Plan notes found for this encounter.     Christinia Gully, MD 11/21/2017

## 2017-11-22 ENCOUNTER — Encounter: Payer: Self-pay | Admitting: Internal Medicine

## 2017-11-22 LAB — RESPIRATORY ALLERGY PROFILE REGION II ~~LOC~~
Allergen, A. alternata, m6: <0.1 kU/L
Allergen, Cedar tree, t12: <0.1 kU/L
Allergen, Comm Silver Birch, t9: <0.1 kU/L
Allergen, Cottonwood, t14: <0.1 kU/L
Allergen, Mulberry, t76: <0.1 kU/L
Allergen, P. notatum, m1: <0.1 kU/L
Aspergillus fumigatus, m3: <0.1 kU/L
CLASS: 0
CLASS: 0
CLASS: 0
CLASS: 0
CLASS: 0
CLASS: 0
CLASS: 0
CLASS: 0
Cat Dander: <0.1 kU/L
Class: 0
Class: 0
Class: 0
Class: 0
Class: 0
Class: 0
Class: 0
Class: 0
Class: 0
Class: 0
Class: 0
Class: 0
Class: 0
Class: 0
Class: 0
Class: 0
D. farinae: <0.1 kU/L
Dog Dander: <0.1 kU/L
IGE (IMMUNOGLOBULIN E), SERUM: 57 kU/L (ref <=114)
Pecan/Hickory Tree IgE: <0.1 kU/L
Rough Pigweed  IgE: <0.1 kU/L

## 2017-11-22 LAB — INTERPRETATION:

## 2017-11-22 NOTE — Assessment & Plan Note (Signed)
FENO 11/21/2017  =   Machine malfunctioned - Allergy profile 11/21/2017 >  Eos 0.4 /  IgE pending   The most common causes of chronic cough in immunocompetent adults include the following: upper airway cough syndrome (UACS), previously referred to as postnasal drip syndrome (PNDS), which is caused by variety of rhinosinus conditions; (2) asthma; (3) GERD; (4) chronic bronchitis from cigarette smoking or other inhaled environmental irritants; (5) nonasthmatic eosinophilic bronchitis; and (6) bronchiectasis.   These conditions, singly or in combination, have accounted for up to 94% of the causes of chronic cough in prospective studies.   Other conditions have constituted no >6% of the causes in prospective studies These have included bronchogenic carcinoma, chronic interstitial pneumonia, sarcoidosis, left ventricular failure, ACEI-induced cough, and aspiration from a condition associated with pharyngeal dysfunction.    Chronic cough is often simultaneously caused by more than one condition. A single cause has been found from 38 to 82% of the time, multiple causes from 18 to 62%. Multiply caused cough has been the result of three diseases up to 42% of the time.       Most likely this is some form of Upper airway cough syndrome (previously labeled PNDS),  is so named because it's frequently impossible to sort out how much is  CR/sinusitis with freq throat clearing (which can be related to primary GERD)   vs  causing  secondary (" extra esophageal")  GERD from wide swings in gastric pressure that occur with throat clearing, often  promoting self use of mint and menthol lozenges that reduce the lower esophageal sphincter tone and exacerbate the problem further in a cyclical fashion.   These are the same pts (now being labeled as having "irritable larynx syndrome" by some cough centers) who not infrequently have a history of having failed to tolerate ace inhibitors,  dry powder inhalers or biphosphonates or  report having atypical/extraesophageal reflux symptoms that don't respond to standard doses of PPI  and are easily confused as having aecopd or asthma flares by even experienced allergists/ pulmonologists (myself included).   Of the three most common causes of  Sub-acute / recurrent or chronic cough, only one (GERD)  can actually contribute to/ trigger  the other two (asthma and post nasal drip syndrome)  and perpetuate the cylce of cough.  While not intuitively obvious, many patients with chronic low grade reflux do not cough until there is a primary insult that disturbs the protective epithelial barrier and exposes sensitive nerve endings.   This is typically viral but can due to PNDS and  either may apply here.   The point is that once this occurs, it is difficult to eliminate the cycle  using anything but a maximally effective acid suppression regimen at least in the short run, accompanied by an appropriate diet to address non acid GERD and control / eliminate the pnds with zytrec and if still not better change to 1st gen H1 blockers per guidelines     Advised pt: The standardized cough guidelines published in Chest by Lissa Morales in 2006 are still the best available and consist of a multiple step process (up to 12!) , not a single office visit,  and are intended  to address this problem logically,  with an alogrithm dependent on response to empiric treatment at  each progressive step  to determine a specific diagnosis with  minimal addtional testing needed. Therefore if adherence is an issue or can't be accurately verified,  it's very unlikely the  standard evaluation and treatment will be successful here.    Furthermore, response to therapy (other than acute cough suppression, which should only be used short term with avoidance of narcotic containing cough syrups if possible), can be a gradual process for which the patient is not likely to  perceive immediate benefit.  Unlike going to an eye  doctor where the best perscription is almost always the first one and is immediately effective, this is almost never the case in the management of chronic cough syndromes. Therefore the patient needs to commit up front to consistently adhere to recommendations  for up to 6 weeks of therapy directed at the likely underlying problem(s) before the response can be reasonably evaluated.   F/u in 4 weeks with all meds in hand using a trust but verify approach to confirm accurate Medication  Reconciliation The principal here is that until we are certain that the  patients are doing what we've asked, it makes no sense to ask them to do more.       Total time devoted to counseling  > 50 % of initial 60 min office visit:  review case with pt/ discussion of options/alternatives/ personally creating written customized instructions  in presence of pt  then going over those specific  Instructions directly with the pt including how to use all of the meds but in particular covering each new medication in detail and the difference between the maintenance= "automatic" meds and the prns using an action plan format for the latter (If this problem/symptom => do that organization reading Left to right).  Please see AVS from this visit for a full list of these instructions which I personally wrote for this pt and  are unique to this visit.

## 2017-11-23 NOTE — Progress Notes (Signed)
Left detailed msg on preferred phone

## 2017-11-24 ENCOUNTER — Telehealth: Payer: Self-pay | Admitting: *Deleted

## 2017-11-24 NOTE — Telephone Encounter (Signed)
Received a call from [place] with [facility]. She contacted patient in reference to his being non compliant with his CPAP therapy. He advises her that he had a cough and recently had been seen by his pulmonologist and was given some medications, however he states that he wakes up in the morning and the machine is off and so is the mask. He does not even remember doing either. States that he is not aware of stopping the machine nor taking off his mask. Neomia Dear that I inform Dr Claiborne Billings of this. To see if he has any recommendations. Message will be forwarded to Dr Claiborne Billings for review and recommendations.

## 2017-11-28 NOTE — Telephone Encounter (Signed)
I would encourage the patient continue attempting to use CPAP.  Hopefully he will become more custom to wearing the mask and not inadvertently taken off.

## 2017-11-29 NOTE — Telephone Encounter (Signed)
The issue is he is removing the mask and turning off the machine and he is not aware that he is doing it. Any suggestions on what he should do. He questions could this be related to medication, sleep walking etc.

## 2017-11-29 NOTE — Telephone Encounter (Signed)
Is on aspirin, Protonix, metoprolol.  I doubt this is due to his medication.  I cannot comment if he is sleepwalking

## 2017-12-06 ENCOUNTER — Telehealth: Payer: Self-pay

## 2017-12-06 MED ORDER — METOPROLOL SUCCINATE ER 25 MG PO TB24: 12.5000 mg | ORAL_TABLET | Freq: Every day | ORAL | 2 refills | Status: DC

## 2017-12-06 NOTE — Telephone Encounter (Signed)
Spoke with pt wife. Adv her that I have refilled the pt's Metoprolol 12.5mg  daily 25mg  tabs #45 R-2.  Rx sent to [facility] in [place]

## 2017-12-20 ENCOUNTER — Ambulatory Visit (INDEPENDENT_AMBULATORY_CARE_PROVIDER_SITE_OTHER): Payer: Medicare Other | Admitting: Internal Medicine

## 2017-12-20 ENCOUNTER — Encounter: Payer: Self-pay | Admitting: Internal Medicine

## 2017-12-20 VITALS — BP 124/70 | HR 73 | Ht 65.5 in | Wt 200.6 lb

## 2017-12-20 DIAGNOSIS — R05 Cough: Secondary | ICD-10-CM

## 2017-12-20 DIAGNOSIS — R058 Other specified cough: Secondary | ICD-10-CM

## 2017-12-20 NOTE — Progress Notes (Signed)
[name], male    DOB: April 20, 1950,   MRN: [id number]     Brief patient profile:  29 yowm never smoker from [place] perfectly healthy until new cough around fall 2017 - symptoms daily then sob/ wheeze 2018 no better with inhalers or steroids  So self-referred 11/21/2017     History of Present Illness  11/21/2017  Pulmonary / 1st office eval  Chief Complaint  Patient presents with  . Pulmonary Consult    Self referral.  Pt c/o cough for the past 2 yrs, worse with wheezing over the past 2 months. He occ will produce some clear sputum.   Dyspnea:  Not limited by breathing from desired activities   Cough: see below  Sleep: cough better when sleeping on 2 pillows / cpap started 11/10/17  SABA use: no better Some am excess mucus runny out of nose   [name] v Neurogenic Cough Differentiator Reflux Comments  Do you awaken from a sound sleep coughing violently?                            With trouble breathing? no   Do you have choking episodes when you cannot  Get enough air, gasping for air ?              Sometimes like gagging but no vomit   Do you usually cough when you lie down into  The bed, or when you just lie down to rest ?                          no   Do you usually cough after meals or eating?         No    Do you cough when (or after) you bend over?    no   GERD SCORE     [name] v Neurogenic Cough Differentiator Neurogenic   Do you more-or-less cough all day long? Pretty constant   Does change of temperature make you cough? no   Does laughing or chuckling cause you to cough? yes   Do fumes (perfume, automobile fumes, burned  Toast, etc.,) cause you to cough ?      No    Does speaking, singing, or talking on the phone cause you to cough   ?               no   Neurogenic/Airway score      rec Stop loratadine and start zyrtec 10 mg one at bedtime instead  Pantoprazole (protonix) 40 mg   Take  30-60 min before first meal of the day and Pepcid (famotidine)  20 mg  one after supper   until return to office - this is the best way to tell whether stomach acid is contributing to your problem. GERD diet  Please remember to go to the lab and x-ray department downstairs in the basement  for your tests - we will call you with the results when they are available. Please schedule a follow up office visit in 4 weeks, sooner if needed  with all medications /inhalers/ solutions in hand so we can verify exactly what you are taking. This includes all medications from all doctors and over the counters    12/20/2017  f/u ov/Ai Sonnenfeld re: uacs/ marked improvement with rx for gerd  Chief Complaint  Patient presents with  . Follow-up    Cough is much  improved. He states he has slight PND on the am's.   Dyspnea:  Not limited by breathing from desired activities   Cough: resolved, min am sense of pnds Sleeping: ok for about 3h then cpap comes off  "don't know why masks come off in sleep as no recollection of removing it"  SABA use: no 02: no     No obvious day to day or daytime variability or assoc excess/ purulent sputum or mucus plugs or hemoptysis or cp or chest tightness, subjective wheeze or overt sinus or hb symptoms.   Sleeping fine from his perspective despite waking up with cpap off  without nocturnal  or early am exacerbation  of respiratory  c/o's or need for noct saba. Also denies any obvious fluctuation of symptoms with weather or environmental changes or other aggravating or alleviating factors except as outlined above   No unusual exposure hx or h/o childhood pna/ asthma or knowledge of premature birth.  Current Allergies, Complete Past Medical History, Past Surgical History, Family History, and Social History were reviewed in [employer] record.  ROS  The following are not active complaints unless bolded Hoarseness, sore throat, dysphagia, dental problems, itching, sneezing,  nasal congestion or discharge of excess mucus or purulent  secretions, ear ache,   fever, chills, sweats, unintended wt loss or wt gain, classically pleuritic or exertional cp,  orthopnea pnd or arm/hand swelling  or leg swelling, presyncope, palpitations, abdominal pain, anorexia, nausea, vomiting, diarrhea  or change in bowel habits or change in bladder habits, change in stools or change in urine, dysuria, hematuria,  rash, arthralgias, visual complaints, headache, numbness, weakness or ataxia or problems with walking or coordination,  change in mood or  memory.        Current Meds  Medication Sig  . allopurinol (ZYLOPRIM) 100 MG tablet Take 2 tablets by mouth daily.  Marland Kitchen aspirin 81 MG tablet Take 81 mg by mouth daily.    . cetirizine (ZYRTEC) 10 MG tablet Take 1 tablet by mouth daily.  . famotidine (PEPCID) 20 MG tablet One at bedtime  . metoprolol succinate (TOPROL-XL) 25 MG 24 hr tablet Take 0.5 tablets (12.5 mg total) by mouth daily.  Marland Kitchen oxybutynin (DITROPAN-XL) 10 MG 24 hr tablet Take 1 tablet by mouth daily.  . pantoprazole (PROTONIX) 40 MG tablet Take 1 tablet (40 mg total) by mouth daily. Take 30-60 min before first meal of the day  . UNABLE TO FIND Med Name: CPAP with sleep               Objective:     12/20/2017     200   11/21/17 205 lb (93 kg)  09/27/17 200 lb (90.7 kg)  08/31/17 200 lb (90.7 kg)    amb pleasant wm nad  Vital signs reviewed - Note on arrival 02 sats  96% on RA     HEENT: nl dentition, turbinates bilaterally, and oropharynx. Nl external ear canals without cough reflex   NECK :  without JVD/Nodes/TM/ nl carotid upstrokes bilaterally   LUNGS: no acc muscle use,  Nl contour chest which is clear to A and P bilaterally without cough on insp or exp maneuvers   CV:  RRR  no s3 or murmur or increase in P2, and no edema   ABD:  soft and nontender with nl inspiratory excursion in the supine position. No bruits or organomegaly appreciated, bowel sounds nl  MS:  Nl gait/ ext warm without deformities, calf  tenderness,  cyanosis or clubbing No obvious joint restrictions   SKIN: warm and dry without lesions    NEURO:  alert, approp, nl sensorium with  no motor or cerebellar deficits apparent.         I personally reviewed images and agree with radiology impression as follows:  CXR:   11/22/17  No active cardiopulmonary disease.       Assessment

## 2017-12-20 NOTE — Patient Instructions (Addendum)
No change in meds    Please schedule a follow up office visit in 6 weeks, call sooner if needed

## 2017-12-22 ENCOUNTER — Encounter: Payer: Self-pay | Admitting: Internal Medicine

## 2017-12-22 NOTE — Assessment & Plan Note (Signed)
FENO 11/21/2017  =   Machine malfunctioned - Allergy profile 11/21/2017 >  Eos 0.4 /  IgE  57 RAST neg  - marked improvement 12/20/2017 on gerd rx  No need for change rx x 6 more weeks then will address the longterm risk/benefit for various ways to rx gerd if prove this finally eliminates the cough present since fall 2017  > 10 min of 15 min visit spent on counseling   Each maintenance medication was reviewed in detail including most importantly the difference between maintenance and as needed and under what circumstances the prns are to be used.  Please see AVS for specific  Instructions which are unique to this visit and I personally typed out  which were reviewed in detail in writing with the patient and a copy provided.

## 2018-01-25 ENCOUNTER — Telehealth: Payer: Self-pay | Admitting: *Deleted

## 2018-01-25 NOTE — Telephone Encounter (Signed)
Received a phone call from [place] @ [facility] informing me the patient returned his CPAP machine on yesterday. She informed him that if he turned it in and later decided to retry it later, he will have to have sleep studies again. Per [name] Patient voiced understanding and decided to turn it in anyway despite her recommending that he wait until after is appointment with Dr Claiborne Billings. States he will speak with Dr Claiborne Billings at his December visit about other alternatives to treat his OSA.

## 2018-02-07 ENCOUNTER — Ambulatory Visit: Payer: Medicare Other | Admitting: Internal Medicine

## 2018-02-13 ENCOUNTER — Ambulatory Visit: Payer: Medicare Other | Admitting: Cardiovascular Disease

## 2018-06-13 ENCOUNTER — Telehealth: Payer: Self-pay | Admitting: Cardiology

## 2018-06-13 NOTE — Telephone Encounter (Signed)
° °  Patient calling to report weakness and diarrhea on yesterday. Patient states he feels better today. Patient did not call PCP No chest pain No shortness of breath

## 2018-06-13 NOTE — Telephone Encounter (Signed)
Returned the pt call. DPR on file with ok to lmom. Pt is to contact his pcp office for non-cardiac symptoms.

## 2018-10-13 NOTE — Progress Notes (Deleted)
Cardiology Office Note   Date:  10/13/2018   ID:  [name], DOB 1950/08/15, MRN [id number]  PCP:  [name] Heys, PA-C  Cardiologist:   [name] Breeding, MD    No chief complaint on file.     History of Present Illness: [name] is a 68 y.o. male who presents for for follow up of lower extremity edema and MR. ***   he is been retired but is thinking of going back part-time.  He is worked in a very physical job driving a Buyer, retail truck.  When he was doing his he would get very fatigued.  He is not really having that since he retired.  He denies any chest pressure, neck or arm discomfort.  Prior to getting his mitral valve repaired he had significant shortness of breath but he does not really have this anymore.  He does not describe PND or orthopnea.  He is not having any palpitations, presyncope or syncope.  His wife is concerned that he will be fatigued again when he goes back to work.  Of note he has severe sleep apnea with periodic limb movements but he never really completed the evaluation or treatment.  Past Medical History:  Diagnosis Date  . Anxiety   . Atrial fibrillation ([place])    postoperative  . BPH (benign prostatic hypertrophy)   . Chronic otitis media    left, with chronic TM perforation; also with bilateral conductive hearing loss (sees Dr. [name] at [facility])  . CKD (chronic kidney disease), stage II 2015   Borderline stage II/III: CrCl@60  ml/min  . Gout   . History of non anemic vitamin B12 deficiency 2012  . Hypogonadism male 08/2010   Axiron started--?compliance? (repeat value 01/2011 mildly improved).  . Mitral regurgitation 04/30/2012  . Mitral Valve Prolapse with Severe Mitral Regurgitation    a. 01/2006 s/p MV Repair w/ 30 mm Edwards physio ring annuloplasty;  b. 06/2013 Echo: Ef 60-65%, no rwma, triv AI, dilated Ao root (42mm), mild to mod MS, mild MR, mildly dil LA.  . Obesity   . Sleep apnea 07/04/2016  . Urge incontinence   . Viral URI 02/18/2014     Past Surgical History:  Procedure Laterality Date  . COLONOSCOPY  2003   Normal per pt: Dr. Anthony Sar in [place]  . [place]   doing fine since  . MITRAL VALVE REPAIR  2007   Dr. [name]  . TRANSTHORACIC ECHOCARDIOGRAM  06/2012; 06/2013   moderate MR (MV repair)--stable compared to prior echo and pt is asymptomatic.  Repeat echo 06/2013: Stable MV repair.  Mild regurg.  Mild AO root dilatation. No change in therapy.     Current Outpatient Medications  Medication Sig Dispense Refill  . allopurinol (ZYLOPRIM) 100 MG tablet Take 2 tablets by mouth daily.    Marland Kitchen aspirin 81 MG tablet Take 81 mg by mouth daily.      . cetirizine (ZYRTEC) 10 MG tablet Take 1 tablet by mouth daily.    . famotidine (PEPCID) 20 MG tablet One at bedtime 30 tablet 11  . metoprolol succinate (TOPROL-XL) 25 MG 24 hr tablet Take 0.5 tablets (12.5 mg total) by mouth daily. 45 tablet 2  . oxybutynin (DITROPAN-XL) 10 MG 24 hr tablet Take 1 tablet by mouth daily.    . pantoprazole (PROTONIX) 40 MG tablet Take 1 tablet (40 mg total) by mouth daily. Take 30-60 min before first meal of the day 30 tablet 2  .  UNABLE TO FIND Med Name: CPAP with sleep     No current facility-administered medications for this visit.     Allergies:   Ivp dye [iodinated diagnostic agents] and Prednisone     ROS:  Please see the history of present illness.   Otherwise, review of systems are positive for ***.   All other systems are reviewed and negative.    PHYSICAL EXAM: VS:  There were no vitals taken for this visit. , BMI There is no height or weight on file to calculate BMI.  GENERAL:  Well appearing NECK:  No jugular venous distention, waveform within normal limits, carotid upstroke brisk and symmetric, no bruits, no thyromegaly LUNGS:  Clear to auscultation bilaterally CHEST:  Unremarkable HEART:  PMI not displaced or sustained,S1 and S2 within normal limits, no S3, no S4, no clicks, no rubs, *** murmurs ABD:  Flat, positive  bowel sounds normal in frequency in pitch, no bruits, no rebound, no guarding, no midline pulsatile mass, no hepatomegaly, no splenomegaly EXT:  2 plus pulses throughout, no edema, no cyanosis no clubbing    ***GENERAL:  Well appearing NECK:  No jugular venous distention, waveform within normal limits, carotid upstroke brisk and symmetric, no bruits, no thyromegaly LUNGS:  Clear to auscultation bilaterally CHEST:  Unremarkable HEART:  PMI not displaced or sustained,S1 and S2 within normal limits, no S3, no S4, no clicks, no rubs, no murmurs ABD:  Flat, positive bowel sounds normal in frequency in pitch, no bruits, no rebound, no guarding, no midline pulsatile mass, no hepatomegaly, no splenomegaly EXT:  2 plus pulses throughout, no edema, no cyanosis no clubbing    EKG:  EKG is  ordered today. Sinus rhythm, rate ***, axis within normal limits, intervals within normal limits, incomplete right bundle branch block, leftward axis, nonspecific anterior T wave changes.  Recent Labs: 11/21/2017: Hemoglobin 16.0; Platelets 206.0    Lipid Panel    Component Value Date/Time   CHOL 224 (H) 06/01/2012 0834   TRIG 108.0 06/01/2012 0834   HDL 28.10 (L) 06/01/2012 0834   CHOLHDL 8 06/01/2012 0834   VLDL 21.6 06/01/2012 0834   LDLDIRECT 180.9 06/01/2012 0834      Wt Readings from Last 3 Encounters:  12/20/17 200 lb 9.6 oz (91 kg)  11/21/17 205 lb (93 kg)  09/27/17 200 lb (90.7 kg)      Other studies Reviewed: Additional studies/ records that were reviewed today include: ***. Review of the above records demonstrates:     ASSESSMENT AND PLAN:   MV REPAIR:  ***  I would not suspect any worsening or regurgitation.  I would suspect stable repair.  He has no symptoms unless he is actively working on his physical job at which time his symptom is not dyspnea but fatigue.  At this point I do not think further imaging is indicated.  EDEMA:   *** This is mild and has resolved since he is not  on his feet daily.  SLEEP APNEA:  He could not tolerate CPAP.  *** He was found to have this and period limb movement.   However, he did not have the appropriate follow-up.  I did have our sleep nurse to talk to him and he will have to have the study redone because it is been so many months.  He agrees to do this.  I think this will help with his fatigue.  COUGH:  ***  He reports sinus drainage.  I like him to talk with his primary  provider about this.  And if his cough does not resolve I could reevaluate.  Current medicines are reviewed at length with the patient today.  The patient does not have concerns regarding medicines.  The following changes have been made:  ***  Labs/ tests ordered today include: ***  No orders of the defined types were placed in this encounter.    Disposition:   FU with me in *** months.     Signed, [name] Breeding, MD  10/13/2018 11:03 PM    [place]

## 2018-10-15 ENCOUNTER — Ambulatory Visit: Payer: Medicare HMO | Admitting: Cardiology

## 2018-10-31 NOTE — Progress Notes (Signed)
Cardiology Office Note   Date:  11/01/2018   ID:  [name], DOB 06/04/1950, MRN [id number]  PCP:  Blair Heys, PA-C  Cardiologist:   Minus Breeding, MD    Chief Complaint  Patient presents with  . Mitral Regurgitation      History of Present Illness: [name] is a 68 y.o. male who presents for for follow up of lower extremity edema and MR. 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  Physically he was getting difficult for him but now he is retired profound that he does not feel like he "has a purpose."  He has had some decreased memory slightly.  He just overall says he does not feel well but he cannot pinpoint it.  He is not describing shortness of breath, PND or orthopnea.  Is not having any palpitations, presyncope or syncope.  He is not having any chest pressure, neck or arm discomfort.   Past Medical History:  Diagnosis Date  . Anxiety   . Atrial fibrillation ([place])    postoperative  . BPH (benign prostatic hypertrophy)   . Chronic otitis media    left, with chronic TM perforation; also with bilateral conductive hearing loss (sees Dr. [name] at [facility])  . CKD (chronic kidney disease), stage II 2015   Borderline stage II/III: CrCl@60  ml/min  . Gout   . History of non anemic vitamin B12 deficiency 2012  . Hypogonadism male 08/2010   Axiron started--?compliance? (repeat value 01/2011 mildly improved).  . Mitral regurgitation 04/30/2012  . Mitral Valve Prolapse with Severe Mitral Regurgitation    a. 01/2006 s/p MV Repair w/ 30 mm Edwards physio ring annuloplasty;  b. 06/2013 Echo: Ef 60-65%, no rwma, triv AI, dilated Ao root (9mm), mild to mod MS, mild MR, mildly dil LA.  . Obesity   . Sleep apnea 07/04/2016  . Urge incontinence   . Viral URI 02/18/2014    Past Surgical History:  Procedure Laterality Date  . COLONOSCOPY  2003   Normal per pt: Dr. Anthony Sar in [place]  . [place]   doing fine since  . MITRAL VALVE  REPAIR  2007   Dr. [name]  . TRANSTHORACIC ECHOCARDIOGRAM  06/2012; 06/2013   moderate MR (MV repair)--stable compared to prior echo and pt is asymptomatic.  Repeat echo 06/2013: Stable MV repair.  Mild regurg.  Mild AO root dilatation. No change in therapy.     Current Outpatient Medications  Medication Sig Dispense Refill  . allopurinol (ZYLOPRIM) 100 MG tablet Take 100 mg by mouth daily.     Marland Kitchen aspirin 81 MG tablet Take 81 mg by mouth daily.      . diphenhydrAMINE (ALLERGY RELIEF) 25 MG tablet Take 25 mg by mouth daily as needed for allergies.    Marland Kitchen Lactobacillus-Inulin ([place]) CAPS Take 1 tablet by mouth daily.    . metoprolol succinate (TOPROL-XL) 25 MG 24 hr tablet Take 0.5 tablets (12.5 mg total) by mouth daily. 45 tablet 2  . oxybutynin (DITROPAN-XL) 10 MG 24 hr tablet Take 1 tablet by mouth daily.    . pantoprazole (PROTONIX) 40 MG tablet Take 1 tablet (40 mg total) by mouth daily. Take 30-60 min before first meal of the day 30 tablet 2  . UNABLE TO FIND Med Name: CPAP with sleep     No current facility-administered medications for this visit.     Allergies:   Ivp  dye [iodinated diagnostic agents] and Prednisone     ROS:  Please see the history of present illness.   Otherwise, review of systems are positive for none.   All other systems are reviewed and negative.    PHYSICAL EXAM: VS:  BP 124/82   Pulse 75   Temp 97.9 F (36.6 C) (Temporal)   Ht 5' 5.5" (1.664 m)   Wt 198 lb 9.6 oz (90.1 kg)   SpO2 94%   BMI 32.55 kg/m  , BMI Body mass index is 32.55 kg/m.  GENERAL:  Well appearing NECK:  No jugular venous distention, waveform within normal limits, carotid upstroke brisk and symmetric, no bruits, no thyromegaly LUNGS:  Clear to auscultation bilaterally CHEST:  Unremarkable HEART:  PMI not displaced or sustained,S1 and S2 within normal limits, no S3, no S4, no clicks, no rubs, no murmurs ABD:  Flat, positive bowel sounds normal in frequency in  pitch, no bruits, no rebound, no guarding, no midline pulsatile mass, no hepatomegaly, no splenomegaly EXT:  2 plus pulses throughout, no edema, no cyanosis no clubbing    EKG:  EKG is  ordered today. Sinus rhythm, rate 75, axis within normal limits, intervals within normal limits, incomplete right bundle branch block, leftward axis, nonspecific anterior T wave changes.  Recent Labs: 11/21/2017: Hemoglobin 16.0; Platelets 206.0    Lipid Panel    Component Value Date/Time   CHOL 224 (H) 06/01/2012 0834   TRIG 108.0 06/01/2012 0834   HDL 28.10 (L) 06/01/2012 0834   CHOLHDL 8 06/01/2012 0834   VLDL 21.6 06/01/2012 0834   LDLDIRECT 180.9 06/01/2012 0834      Wt Readings from Last 3 Encounters:  11/01/18 198 lb 9.6 oz (90.1 kg)  12/20/17 200 lb 9.6 oz (91 kg)  11/21/17 205 lb (93 kg)      Other studies Reviewed: Additional studies/ records that were reviewed today include: None. Review of the above records demonstrates:     ASSESSMENT AND PLAN:    MV REPAIR:   By exam there is no murmurs.  I would not suggest further imaging is indicated.  No change in therapy.   SLEEP APNEA:  He could not tolerate CPAP.  Unfortunately he is not tolerated treatment for this and no further suggestions are available.   ANHEDONIA: I discussed this with him and thought possibly he could have some clinical depression.  I asked him to talk this over with his primary care provider.  In addition we talked at length about volunteer position and he is thinking about [employer].   Current medicines are reviewed at length with the patient today.  The patient does not have concerns regarding medicines.  The following changes have been made:  None  Labs/ tests ordered today include: None  No orders of the defined types were placed in this encounter.    Disposition:   FU with me in 12 months.     Signed, Minus Breeding, MD  11/01/2018 12:08 PM    [facility]

## 2018-11-01 ENCOUNTER — Ambulatory Visit (INDEPENDENT_AMBULATORY_CARE_PROVIDER_SITE_OTHER): Payer: Medicare HMO | Admitting: Cardiology

## 2018-11-01 ENCOUNTER — Other Ambulatory Visit: Payer: Self-pay

## 2018-11-01 ENCOUNTER — Encounter: Payer: Self-pay | Admitting: Cardiology

## 2018-11-01 VITALS — BP 124/82 | HR 75 | Temp 97.9°F | Ht 65.5 in | Wt 198.6 lb

## 2018-11-01 DIAGNOSIS — G4733 Obstructive sleep apnea (adult) (pediatric): Secondary | ICD-10-CM

## 2018-11-01 DIAGNOSIS — R05 Cough: Secondary | ICD-10-CM | POA: Diagnosis not present

## 2018-11-01 DIAGNOSIS — I340 Nonrheumatic mitral (valve) insufficiency: Secondary | ICD-10-CM

## 2018-11-01 DIAGNOSIS — R609 Edema, unspecified: Secondary | ICD-10-CM | POA: Diagnosis not present

## 2018-11-01 DIAGNOSIS — R059 Cough, unspecified: Secondary | ICD-10-CM

## 2018-11-01 NOTE — Addendum Note (Signed)
Addended by: Earvin Hansen on: 11/01/2018 06:29 PM   Modules accepted: Orders

## 2018-11-01 NOTE — Patient Instructions (Signed)

## 2018-11-14 ENCOUNTER — Other Ambulatory Visit: Payer: Self-pay | Admitting: Cardiology

## 2018-11-14 MED ORDER — METOPROLOL SUCCINATE ER 25 MG PO TB24: 12.5000 mg | ORAL_TABLET | Freq: Every day | ORAL | 10 refills | Status: DC

## 2018-11-14 NOTE — Telephone Encounter (Signed)
Requested Prescriptions   Signed Prescriptions Disp Refills  . metoprolol succinate (TOPROL-XL) 25 MG 24 hr tablet 15 tablet 10    Sig: Take 0.5 tablets (12.5 mg total) by mouth daily.    Authorizing Provider: Minus Breeding    Ordering User: Raelene Bott, Ligaya Cormier L

## 2018-11-14 NOTE — Telephone Encounter (Signed)
 *  STAT* If patient is at the pharmacy, call can be transferred to refill team.   1. Which medications need to be refilled? (please list name of each medication and dose if known) metoprolol succinate (TOPROL-XL) 25 MG 24 hr tablet  2. Which pharmacy/location (including street and city if local pharmacy) is medication to be sent to? [facility]  3. Do they need a 30 day or 90 day supply? 30  Patient is completely out of medication

## 2018-12-25 ENCOUNTER — Telehealth: Payer: Self-pay | Admitting: Cardiology

## 2018-12-25 MED ORDER — METOPROLOL SUCCINATE ER 25 MG PO TB24: 12.5000 mg | ORAL_TABLET | Freq: Every day | ORAL | 3 refills | Status: DC

## 2018-12-25 NOTE — Telephone Encounter (Signed)
New Message:    Pt says he is having problems sleep and wants to talk to a nurse about this.

## 2018-12-25 NOTE — Telephone Encounter (Signed)
Spoke with pt, when patient lays on his left side he will hear his heart beat in his ears and he will not be able to go to sleep. It does not happen on the right side. He reports this is new and has been going on for a few months now. He reports the heart rhythm sounds regular and he denies any cp or SOB. He is not able to use CPAP. He would like dr hochrein's option on what this could be. Will forward to dr hochrein to review and advise.

## 2018-12-25 NOTE — Telephone Encounter (Signed)
This is not uncommon.  He can increase the metoprolol to a whole pill daily.

## 2018-12-26 MED ORDER — METOPROLOL SUCCINATE ER 25 MG PO TB24: 25.0000 mg | ORAL_TABLET | Freq: Every day | ORAL | 3 refills | Status: DC

## 2018-12-26 NOTE — Telephone Encounter (Signed)
LM2CB 10/21

## 2018-12-26 NOTE — Telephone Encounter (Signed)
Follow up ° ° °Patient is returning call. Please call. °

## 2018-12-26 NOTE — Telephone Encounter (Signed)
Told pt Dr. Rosezella [place] advice with increasing Metoprolol. Verbalized understanding. Sent in new prescription.

## 2018-12-26 NOTE — Addendum Note (Signed)
Addended by: Cain Sieve on: 12/26/2018 12:19 PM   Modules accepted: Orders

## 2019-06-13 ENCOUNTER — Other Ambulatory Visit (INDEPENDENT_AMBULATORY_CARE_PROVIDER_SITE_OTHER): Payer: Medicare HMO

## 2019-06-13 ENCOUNTER — Ambulatory Visit: Payer: Medicare HMO | Admitting: Gastroenterology

## 2019-06-13 ENCOUNTER — Encounter: Payer: Self-pay | Admitting: Gastroenterology

## 2019-06-13 VITALS — BP 128/80 | HR 80 | Temp 98.2°F | Ht 65.5 in | Wt 200.0 lb

## 2019-06-13 DIAGNOSIS — R140 Abdominal distension (gaseous): Secondary | ICD-10-CM

## 2019-06-13 DIAGNOSIS — R1030 Lower abdominal pain, unspecified: Secondary | ICD-10-CM | POA: Insufficient documentation

## 2019-06-13 DIAGNOSIS — R197 Diarrhea, unspecified: Secondary | ICD-10-CM | POA: Insufficient documentation

## 2019-06-13 LAB — COMPREHENSIVE METABOLIC PANEL
ALT: 92 U/L — ABNORMAL HIGH (ref 0–53)
AST: 75 U/L — ABNORMAL HIGH (ref 0–37)
Albumin: 4.4 g/dL (ref 3.5–5.2)
Alkaline Phosphatase: 71 U/L (ref 39–117)
BUN: 16 mg/dL (ref 6–23)
CO2: 28 mEq/L (ref 19–32)
Calcium: 9.5 mg/dL (ref 8.4–10.5)
Chloride: 105 mEq/L (ref 96–112)
Creatinine, Ser: 1.19 mg/dL (ref 0.40–1.50)
GFR: 60.58 mL/min (ref >60.00)
Glucose, Bld: 103 mg/dL — ABNORMAL HIGH (ref 70–99)
Potassium: 4.1 mEq/L (ref 3.5–5.1)
Sodium: 141 mEq/L (ref 135–145)
Total Bilirubin: 0.9 mg/dL (ref 0.2–1.2)
Total Protein: 7.5 g/dL (ref 6.0–8.3)

## 2019-06-13 LAB — CBC WITH DIFFERENTIAL/PLATELET
Basophils Absolute: 0.1 10*3/uL (ref 0.0–0.1)
Basophils Relative: 1.7 % (ref 0.0–3.0)
Eosinophils Absolute: 0.4 10*3/uL (ref 0.0–0.7)
Eosinophils Relative: 4.3 % (ref 0.0–5.0)
HCT: 47.6 % (ref 39.0–52.0)
Hemoglobin: 16.2 g/dL (ref 13.0–17.0)
Lymphocytes Relative: 41.7 % (ref 12.0–46.0)
Lymphs Abs: 3.4 10*3/uL (ref 0.7–4.0)
MCHC: 34 g/dL (ref 30.0–36.0)
MCV: 87.1 fl (ref 78.0–100.0)
Monocytes Absolute: 0.5 10*3/uL (ref 0.1–1.0)
Monocytes Relative: 6.3 % (ref 3.0–12.0)
Neutro Abs: 3.8 10*3/uL (ref 1.4–7.7)
Neutrophils Relative %: 46 % (ref 43.0–77.0)
Platelets: 187 10*3/uL (ref 150.0–400.0)
RBC: 5.46 Mil/uL (ref 4.22–5.81)
RDW: 13.2 % (ref 11.5–15.5)
WBC: 8.2 10*3/uL (ref 4.0–10.5)

## 2019-06-13 LAB — TSH: TSH: 2.97 u[IU]/mL (ref 0.35–4.50)

## 2019-06-13 NOTE — Progress Notes (Signed)
Reviewed and agree with management plans. ? ?Mozel Burdett L. Toshiye Kever, MD, MPH  ?

## 2019-06-13 NOTE — Progress Notes (Addendum)
06/13/2019 KAAN CASINI [id number] 1951/01/22   HISTORY OF PRESENT ILLNESS: This is a 69 year old male who is new to our office.  He says that he believes he was seen here many years ago by Dr. Velora Heckler, but since then had been seen at GI office in [place] where he had EGD and colonoscopy in September 2017.  He says from what he recalls he had one polyp removed during colonoscopy and that nothing significant was found on endoscopy.  Nonetheless, we do not have those records but we are going to request those.  He is here today with complaints of lower abdominal pain and cramping, diarrhea, and bloating/flatulence.  He says that all of these symptoms have been present for the past 1 to 2 months.  He says that the abdominal pain comes and goes but describes it as a griping pain down in his lower abdomen.  He reports having diarrhea several days out of the week.  On the days that he has diarrhea he goes multiple times throughout the day.  No nocturnal stools.  He says that he has had instances where he went to pass some flatus and had passed stool as well.  He denies nausea, vomiting, fever, chills, rectal bleeding, and weight loss.  He says that sometimes he feels like he can feel a knot dent in his lower abdomen/pelvis.  Of note, he is somewhat of a limited historian.  He says that he has been having problems with his memory and was having trouble remembering things and details of his visit today.  He admits that he has had a lot going on in his life recently as his daughter was hospitalized for 3 weeks due to complications from her diabetes.    Past Medical History:  Diagnosis Date  . Anxiety   . Atrial fibrillation ([place])    postoperative  . BPH (benign prostatic hypertrophy)   . Chronic otitis media    left, with chronic TM perforation; also with bilateral conductive hearing loss (sees Dr. [name] at [facility])  . CKD (chronic kidney disease), stage II 2015   Borderline stage II/III:  CrCl@60  ml/min  . Gout   . History of non anemic vitamin B12 deficiency 2012  . Hypogonadism male 08/2010   Axiron started--?compliance? (repeat value 01/2011 mildly improved).  . Mitral regurgitation 04/30/2012  . Mitral Valve Prolapse with Severe Mitral Regurgitation    a. 01/2006 s/p MV Repair w/ 30 mm Edwards physio ring annuloplasty;  b. 06/2013 Echo: Ef 60-65%, no rwma, triv AI, dilated Ao root (39mm), mild to mod MS, mild MR, mildly dil LA.  . Obesity   . Sleep apnea 07/04/2016  . Urge incontinence   . Viral URI 02/18/2014   Past Surgical History:  Procedure Laterality Date  . COLONOSCOPY  2003   Normal per pt: Dr. Anthony Sar in [place]  . [place]   doing fine since  . MITRAL VALVE REPAIR  2007   Dr. [name]  . TRANSTHORACIC ECHOCARDIOGRAM  06/2012; 06/2013   moderate MR (MV repair)--stable compared to prior echo and pt is asymptomatic.  Repeat echo 06/2013: Stable MV repair.  Mild regurg.  Mild AO root dilatation. No change in therapy.    reports that he has never smoked. He has never used smokeless tobacco. He reports that he does not drink alcohol or use drugs. family history includes Alcohol abuse in his father and mother; Bladder Cancer in his father; Prostate cancer in his father;  Throat cancer in his maternal uncle. Allergies  Allergen Reactions  . Ivp Dye [Iodinated Diagnostic Agents] Anaphylaxis  . Prednisone Other (See Comments)    Moodiness & felt "weird'      Outpatient Encounter Medications as of 06/13/2019  Medication Sig  . allopurinol (ZYLOPRIM) 100 MG tablet Take 100 mg by mouth daily.   Marland Kitchen aspirin 81 MG tablet Take 81 mg by mouth daily.    Marland Kitchen dicyclomine (BENTYL) 10 MG capsule Take by mouth.  . Homeopathic Products (ALLERGY MEDICINE PO) Take by mouth.  . metoprolol succinate (TOPROL-XL) 25 MG 24 hr tablet Take 1 tablet (25 mg total) by mouth daily.  . raNITIdine HCl (ACID REDUCER PO) Take by mouth.  Marland Kitchen UNABLE TO FIND Med Name: CPAP with sleep  .  [DISCONTINUED] diphenhydrAMINE (ALLERGY RELIEF) 25 MG tablet Take 25 mg by mouth daily as needed for allergies.  . [DISCONTINUED] Lactobacillus-Inulin ([place]) CAPS Take 1 tablet by mouth daily.  . [DISCONTINUED] oxybutynin (DITROPAN-XL) 10 MG 24 hr tablet Take 1 tablet by mouth daily.  . [DISCONTINUED] pantoprazole (PROTONIX) 40 MG tablet Take 1 tablet (40 mg total) by mouth daily. Take 30-60 min before first meal of the day   No facility-administered encounter medications on file as of 06/13/2019.     REVIEW OF SYSTEMS  : All other systems reviewed and negative except where noted in the History of Present Illness.   PHYSICAL EXAM: BP 128/80   Pulse (!) 8   Temp 98.2 F (36.8 C)   Ht 5' 5.5" (1.664 m)   Wt 200 lb (90.7 kg)   BMI 32.78 kg/m  General: Well developed white male in no acute distress Head: Normocephalic and atraumatic Eyes:  Sclerae anicteric, conjunctiva pink. Ears: Normal auditory acuity Lungs: Clear throughout to auscultation; no increased WOB. Heart: Regular rate and rhythm; no M/R/G. Abdomen: Soft, non-distended.  BS present.  Mild LLQ TTP. Musculoskeletal: Symmetrical with no gross deformities  Skin: No lesions on visible extremities Extremities: No edema  Neurological: Alert oriented x 4, grossly non-focal Psychological:  Alert and cooperative. Normal mood and affect  ASSESSMENT AND PLAN: *69 year old male with complaints of lower abdominal pain, bloating and flatulence, and diarrhea for the past 1 to 2 months.  Says that the pain comes and goes.  Has diarrhea several days out of the week.  Had colonoscopy in 2017 in [place].  We will try to get those records.  We will start with some labs including CBC, CMP, TSH.  I would like him to have stool studies performed as well to rule out infectious source.  We will plan for CT scan of the abdomen/pelvis with p.o. contrast only due to IV dye allergy.  Pending those results and findings from  previous colonoscopy may need repeat colonoscopy.  He was given dicyclomine, but is only using that as needed.  I have asked him begin taking it 10 mg twice daily on a regular basis to start.  **Addendum: Received EGD and colonoscopy records.  These were performed by Dr. [name] in September 2017.  At that time EGD was completely normal.  Colonoscopy showed one 3 mm polyp in the distal sigmoid colon that was a tubular adenoma on pathology.  Also had grade 1 internal hemorrhoids.  We will send these records to be scanned.   CC:  Blair Heys, PA-C

## 2019-06-13 NOTE — Patient Instructions (Addendum)
If you are age 69 or older, your body mass index should be between 23-30. Your Body mass index is 32.78 kg/m. If this is out of the aforementioned range listed, please consider follow up with your Primary Care Provider.  If you are age 15 or younger, your body mass index should be between 19-25. Your Body mass index is 32.78 kg/m. If this is out of the aformentioned range listed, please consider follow up with your Primary Care Provider.   Your provider has requested that you go to the basement level for lab work before leaving today. Press "B" on the elevator. The lab is located at the first door on the left as you exit the elevator.  START taking your Dicyclomine twice daily (once in the morning and once in the afternoon)  You have been scheduled for a CT scan of the abdomen and pelvis at [facility] ([address]---this is in the same building as [employer]).   You are scheduled on Monday 06/17/19 at 10:00 am. You should arrive 15 minutes prior to your appointment time for registration. Please follow the written instructions below on the day of your exam:  WARNING: IF YOU ARE ALLERGIC TO IODINE/X-RAY DYE, PLEASE NOTIFY RADIOLOGY IMMEDIATELY AT [phone]! YOU WILL BE GIVEN A 13 HOUR PREMEDICATION PREP.  1) Do not eat or drink anything after 6:00 am (4 hours prior to your test) 2) You have been given 2 bottles of oral contrast to drink. The solution may taste better if refrigerated, but do NOT add ice or any other liquid to this solution. Shake well before drinking.    Drink 1 bottle of contrast @ 8:00 am (2 hours prior to your exam)  Drink 1 bottle of contrast @ 9:00 am (1 hour prior to your exam)  You may take any medications as prescribed with a small amount of water, if necessary. If you take any of the following medications: METFORMIN, GLUCOPHAGE, GLUCOVANCE, AVANDAMET, RIOMET, FORTAMET, [place] MET, JANUMET, GLUMETZA or METAGLIP, you MAY be asked to HOLD  this medication 48 hours AFTER the exam.  The purpose of you drinking the oral contrast is to aid in the visualization of your intestinal tract. The contrast solution may cause some diarrhea. Depending on your individual set of symptoms, you may also receive an intravenous injection of x-ray contrast/dye. Plan on being at [facility] for 30 minutes or longer, depending on the type of exam you are having performed.  This test typically takes 30-45 minutes to complete.  If you have any questions regarding your exam or if you need to reschedule, you may call the CT department at [phone] between the hours of 8:00 am and 5:00 pm, Monday-Friday.  ________________________________________________________________________

## 2019-06-17 ENCOUNTER — Ambulatory Visit (INDEPENDENT_AMBULATORY_CARE_PROVIDER_SITE_OTHER)
Admission: RE | Admit: 2019-06-17 | Discharge: 2019-06-17 | Disposition: A | Discharge status: Home / Self Care | Payer: Medicare HMO | Source: Ambulatory Visit | Attending: Gastroenterology | Admitting: Gastroenterology

## 2019-06-17 ENCOUNTER — Other Ambulatory Visit: Payer: Self-pay

## 2019-06-17 DIAGNOSIS — R140 Abdominal distension (gaseous): Secondary | ICD-10-CM | POA: Diagnosis not present

## 2019-06-17 DIAGNOSIS — R197 Diarrhea, unspecified: Secondary | ICD-10-CM

## 2019-06-17 DIAGNOSIS — R1030 Lower abdominal pain, unspecified: Secondary | ICD-10-CM | POA: Diagnosis not present

## 2019-06-18 ENCOUNTER — Telehealth: Payer: Self-pay | Admitting: Gastroenterology

## 2019-06-18 NOTE — Telephone Encounter (Signed)
I spoke with the pt yesterday regarding CT results.  No calls made today.  Pt aware

## 2019-06-18 NOTE — Telephone Encounter (Signed)
Patient is returning your call not sure what it's in reference to

## 2019-06-19 ENCOUNTER — Telehealth: Payer: Self-pay | Admitting: Gastroenterology

## 2019-06-19 NOTE — Telephone Encounter (Signed)
Tried to reach pt to inform him that report was faxed to PCP on 4/12.  Mail box was full.  Will wait further communication from the pt

## 2019-06-20 ENCOUNTER — Telehealth: Payer: Self-pay | Admitting: Gastroenterology

## 2019-06-20 NOTE — Telephone Encounter (Signed)
The patient has been notified of this information and all questions answered.

## 2019-06-20 NOTE — Telephone Encounter (Signed)
Left message on machine to call back  

## 2019-06-20 NOTE — Telephone Encounter (Signed)
Labs are all normal except for mildly elevated liver enzymes.  Looking back it looks like they have been elevated in the past dating back at least 3 or 4 years.  Are lower now than they had been previously.  Liver looks fine on CT scan.

## 2019-06-20 NOTE — Telephone Encounter (Signed)
[name] have you reviewed lab results?

## 2019-06-20 NOTE — Telephone Encounter (Signed)
Patient requesting lab results

## 2019-06-20 NOTE — Telephone Encounter (Signed)
Patient returned your call, please call patient one more time.   

## 2019-07-05 ENCOUNTER — Ambulatory Visit: Payer: Medicare HMO | Admitting: Internal Medicine

## 2019-07-05 ENCOUNTER — Encounter: Payer: Self-pay | Admitting: Internal Medicine

## 2019-07-05 ENCOUNTER — Other Ambulatory Visit: Payer: Self-pay

## 2019-07-05 DIAGNOSIS — R911 Solitary pulmonary nodule: Secondary | ICD-10-CM

## 2019-07-05 DIAGNOSIS — R05 Cough: Secondary | ICD-10-CM | POA: Diagnosis not present

## 2019-07-05 DIAGNOSIS — R058 Other specified cough: Secondary | ICD-10-CM

## 2019-07-05 MED ORDER — PANTOPRAZOLE SODIUM 40 MG PO TBEC: 40.0000 mg | DELAYED_RELEASE_TABLET | Freq: Every day | ORAL | 2 refills | Status: DC

## 2019-07-05 MED ORDER — FAMOTIDINE 20 MG PO TABS: ORAL_TABLET | ORAL | 11 refills | Status: DC

## 2019-07-05 NOTE — Progress Notes (Signed)
[name], male    DOB: March 26, 1950,   MRN: [id number]     Brief patient profile:  60  yowm never smoker from [place] perfectly healthy until new cough around fall 2017 - symptoms daily then sob/ wheeze 2018 no better with inhalers or steroids  So self-referred 11/21/2017     History of Present Illness  11/21/2017  Pulmonary / 1st office eval  Chief Complaint  Patient presents with  . Pulmonary Consult    Self referral.  Pt c/o cough for the past 2 yrs, worse with wheezing over the past 2 months. He occ will produce some clear sputum.   Dyspnea:  Not limited by breathing from desired activities   Cough: see below  Sleep: cough better when sleeping on 2 pillows / cpap started 11/10/17  SABA use: no better Some am excess mucus runny out of nose   [name] v Neurogenic Cough Differentiator Reflux Comments  Do you awaken from a sound sleep coughing violently?                            With trouble breathing? no   Do you have choking episodes when you cannot  Get enough air, gasping for air ?              Sometimes like gagging but no vomit   Do you usually cough when you lie down into  The bed, or when you just lie down to rest ?                          no   Do you usually cough after meals or eating?         No    Do you cough when (or after) you bend over?    no   GERD SCORE     [name] v Neurogenic Cough Differentiator Neurogenic   Do you more-or-less cough all day long? Pretty constant   Does change of temperature make you cough? no   Does laughing or chuckling cause you to cough? yes   Do fumes (perfume, automobile fumes, burned  Toast, etc.,) cause you to cough ?      No    Does speaking, singing, or talking on the phone cause you to cough   ?               no   Neurogenic/Airway score      rec Stop loratadine and start zyrtec 10 mg one at bedtime instead  Pantoprazole (protonix) 40 mg   Take  30-60 min before first meal of the day and Pepcid (famotidine)  20  mg one after supper   until return to office - this is the best way to tell whether stomach acid is contributing to your problem. GERD diet  Please remember to go to the lab and x-ray department downstairs in the basement  for your tests - we will call you with the results when they are available. Please schedule a follow up office visit in 4 weeks, sooner if needed  with all medications /inhalers/ solutions in hand so we can verify exactly what you are taking. This includes all medications from all doctors and over the counters    12/20/2017  f/u ov/[name] re: uacs/ marked improvement with rx for gerd  Chief Complaint  Patient presents with  . Follow-up    Cough is  much improved. He states he has slight PND on the am's.   Dyspnea:  Not limited by breathing from desired activities   Cough: resolved, min am sense of pnds Sleeping: ok for about 3h then cpap comes off  "don't know why masks come off in sleep as no recollection of removing it"  SABA use: no 02: no   rec No change    07/05/2019  Consultation [name] re:     CT abd c/w 9 mm  RLL  Chief Complaint  Patient presents with  . Consult  Dyspnea:  Not limited by breathing from desired activities   Cough: day > noct, dry worse off gerd rx Sleeping: fine flat / 2 pillows  SABA use: none 02: none    No obvious day to day or daytime variability or assoc excess/ purulent sputum or mucus plugs or hemoptysis or cp or chest tightness, subjective wheeze or overt sinus or hb symptoms.   Sleeping fine  without nocturnal  or early am exacerbation  of respiratory  c/o's or need for noct saba. Also denies any obvious fluctuation of symptoms with weather or environmental changes or other aggravating or alleviating factors except as outlined above   No unusual exposure hx or h/o childhood pna/ asthma or knowledge of premature birth.  Current Allergies, Complete Past Medical History, Past Surgical History, Family History, and Social History were  reviewed in [employer] record.  ROS  The following are not active complaints unless bolded Hoarseness, sore throat, dysphagia, dental problems, itching, sneezing,  nasal congestion or discharge of excess mucus or purulent secretions, ear ache,   fever, chills, sweats, unintended wt loss or wt gain, classically pleuritic or exertional cp,  orthopnea pnd or arm/hand swelling  or leg swelling, presyncope, palpitations, abdominal pain, anorexia, nausea, vomiting, diarrhea  or change in bowel habits or change in bladder habits, change in stools or change in urine, dysuria, hematuria,  rash, arthralgias, visual complaints, headache, numbness, weakness or ataxia or problems with walking or coordination,  change in mood or  memory.        Current Meds  Medication Sig  . allopurinol (ZYLOPRIM) 100 MG tablet Take 100 mg by mouth daily.   Marland Kitchen aspirin 81 MG tablet Take 81 mg by mouth daily.    . Homeopathic Products (ALLERGY MEDICINE PO) Take by mouth.  . metoprolol succinate (TOPROL-XL) 25 MG 24 hr tablet Take 1 tablet (25 mg total) by mouth daily.  .     Karen Kays TO FIND Med Name: CPAP with sleep                    Objective:    07/05/2019        200 12/20/2017     200   11/21/17 205 lb (93 kg)  09/27/17 200 lb (90.7 kg)  08/31/17 200 lb (90.7 kg)     amb pleasant wm nad   Vital signs reviewed  07/05/2019  - Note at rest 02 sats  93% on RA    HEENT : pt wearing mask not removed for exam due to covid -19 concerns.    NECK :  without JVD/Nodes/TM/ nl carotid upstrokes bilaterally   LUNGS: no acc muscle use,  Nl contour chest which is clear to A and P bilaterally without cough on insp or exp maneuvers   CV:  RRR  no s3 or murmur or increase in P2, and no edema   ABD:  soft and  nontender with nl inspiratory excursion in the supine position. No bruits or organomegaly appreciated, bowel sounds nl  MS:  Nl gait/ ext warm without deformities, calf tenderness,  cyanosis or clubbing No obvious joint restrictions   SKIN: warm and dry without lesions    NEURO:  alert, approp, nl sensorium with  no motor or cerebellar deficits apparent.      I personally reviewed images and agree with radiology impression as follows:   Chest CT cuts on abd ct done  06/17/19  Right lower lobe pulmonary nodule with mean diameter of 9 mm. Follow-up noncontrast chest CT is recommended in 3 months to re-evaluate this finding              Assessment

## 2019-07-05 NOTE — Patient Instructions (Addendum)
Pantoprazole (protonix) 40 mg   Take  30-60 min before first meal of the day and Pepcid (famotidine)  20 mg after supper  until return to office - this is the best way to tell whether stomach acid is contributing to your problem.    GERD (REFLUX)  is an extremely common cause of respiratory symptoms just like yours , many times with no obvious heartburn at all.    It can be treated with medication, but also with lifestyle changes including elevation of the head of your bed (ideally with 6 -8inch blocks under the headboard of your bed),  Smoking cessation, avoidance of late meals, excessive alcohol, and avoid fatty foods, chocolate, peppermint, colas, red wine, and acidic juices such as orange juice.  NO MINT OR MENTHOL PRODUCTS SO NO COUGH DROPS  USE SUGARLESS CANDY INSTEAD (Jolley ranchers or Stover's or Life Savers) or even ice chips will also do - the key is to swallow to prevent all throat clearing. NO OIL BASED VITAMINS - use powdered substitutes.  Avoid fish oil when coughing.  We will call you to set up your follow up CT  prior to your next visit in 3 months

## 2019-07-06 ENCOUNTER — Encounter: Payer: Self-pay | Admitting: Internal Medicine

## 2019-07-06 DIAGNOSIS — R911 Solitary pulmonary nodule: Secondary | ICD-10-CM | POA: Insufficient documentation

## 2019-07-06 NOTE — Assessment & Plan Note (Signed)
Never smoker - incidental finding on CT abd 06/17/19 > recheck @ mid July 2021  CT results reviewed with pt >>> Too small for PET or bx, not suspicious enough for excisional bx > really only option for now is follow the Fleischner society guidelines as rec by radiology = 3 month f/u with full chest ct  Discussed in detail all the  indications, usual  risks and alternatives  relative to the benefits with patient who agrees to proceed with w/u as outlined.             Each maintenance medication was reviewed in detail including emphasizing most importantly the difference between maintenance and prns and under what circumstances the prns are to be triggered using an action plan format where appropriate.  Total time for H and P, chart review, counseling,   and generating customized AVS unique to this office consultation / charting = 45 min

## 2019-07-06 NOTE — Assessment & Plan Note (Signed)
Onset Fall 2017  FENO 11/21/2017  =   Machine malfunctioned - Allergy profile 11/21/2017 >  Eos 0.4 /  IgE  57 RAST neg  - marked improvement 12/20/2017 on gerd rx - worse 07/05/2019 off gerd rx > restart   Upper airway cough syndrome (previously labeled PNDS),  is so named because it's frequently impossible to sort out how much is  CR/sinusitis with freq throat clearing (which can be related to primary GERD)   vs  causing  secondary (" extra esophageal")  GERD from wide swings in gastric pressure that occur with throat clearing, often  promoting self use of mint and menthol lozenges that reduce the lower esophageal sphincter tone and exacerbate the problem further in a cyclical fashion.   These are the same pts (now being labeled as having "irritable larynx syndrome" by some cough centers) who not infrequently have a history of having failed to tolerate ace inhibitors,  dry powder inhalers or biphosphonates or report having atypical/extraesophageal reflux symptoms that don't respond to standard doses of PPI  and are easily confused as having aecopd or asthma flares by even experienced allergists/ pulmonologists (myself included).  Assured very unlikely this has anything to do with SPN    >>> restart max gerd rx/ diet x 3 m then ov to f/u cough and spn

## 2019-07-24 IMAGING — DX DG CHEST 2V
2 series · 2 of 2 positions shown · non-contrast
Comparison: Radiographs February 14, 2010.

CLINICAL DATA: Chronic cough.

EXAM:
CHEST - 2 VIEW

[chest pa]
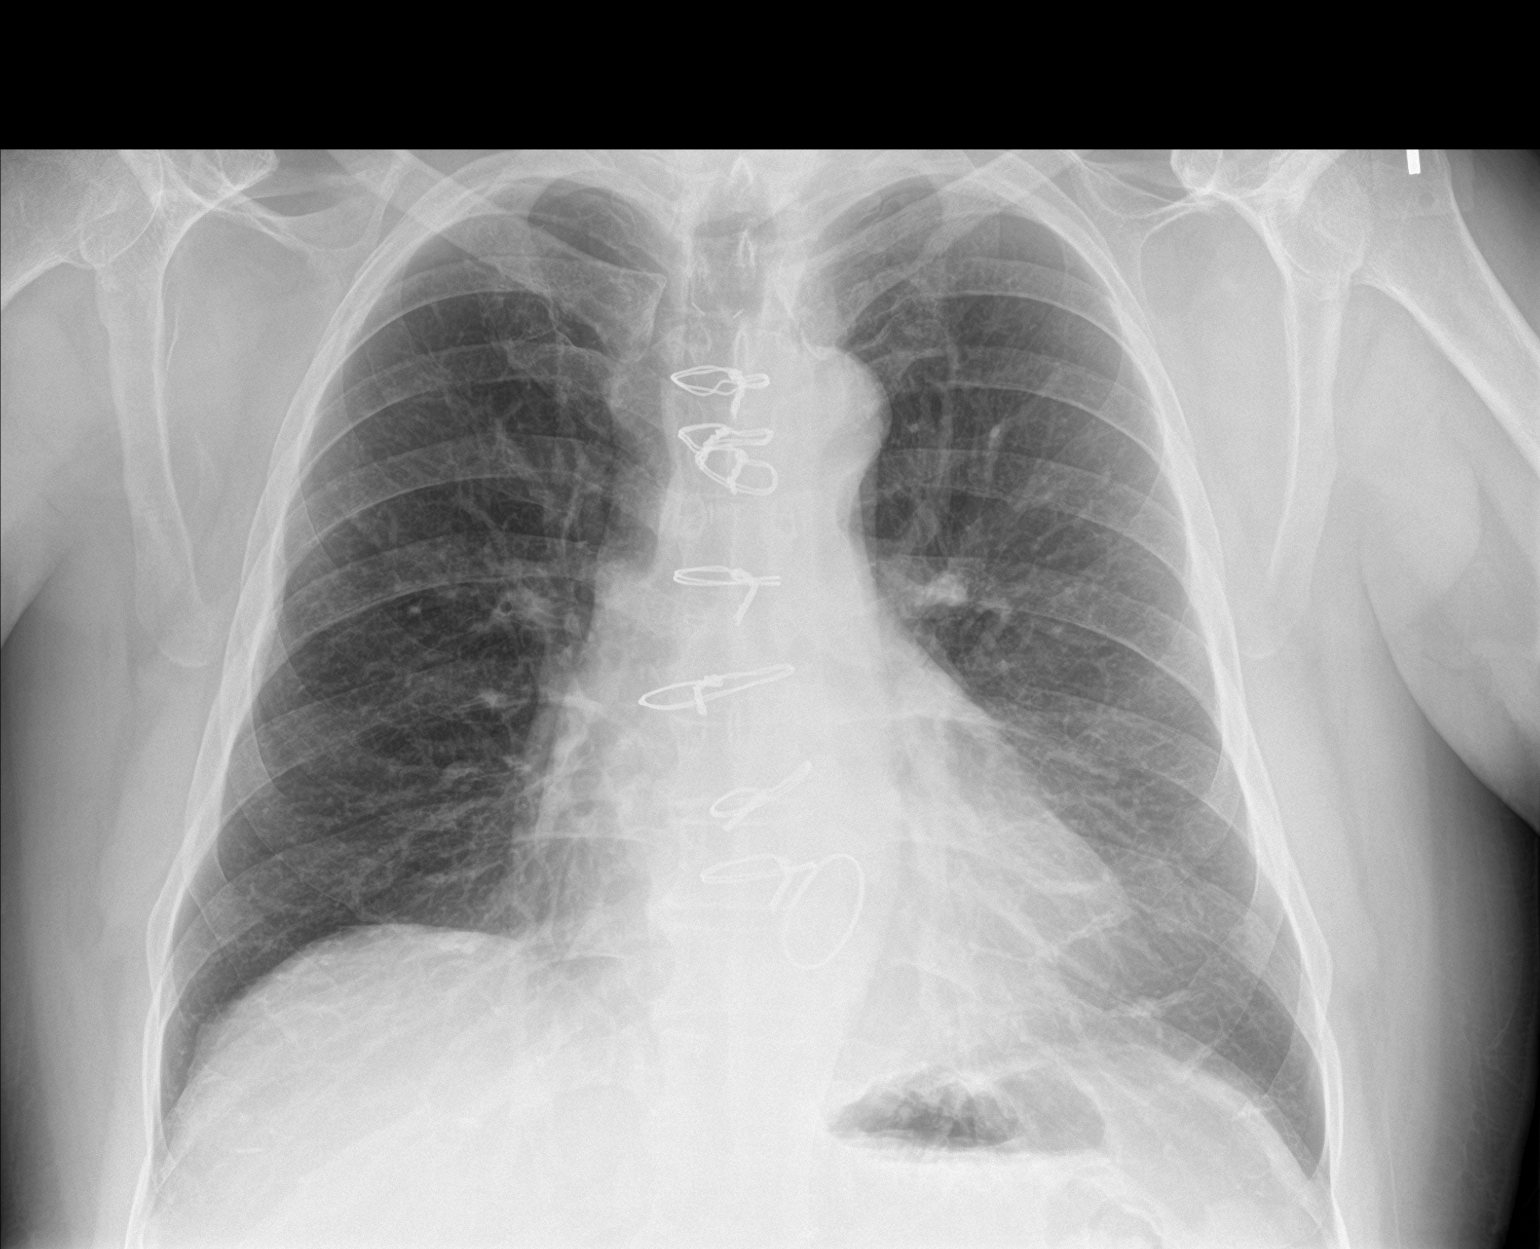

[chest lat]
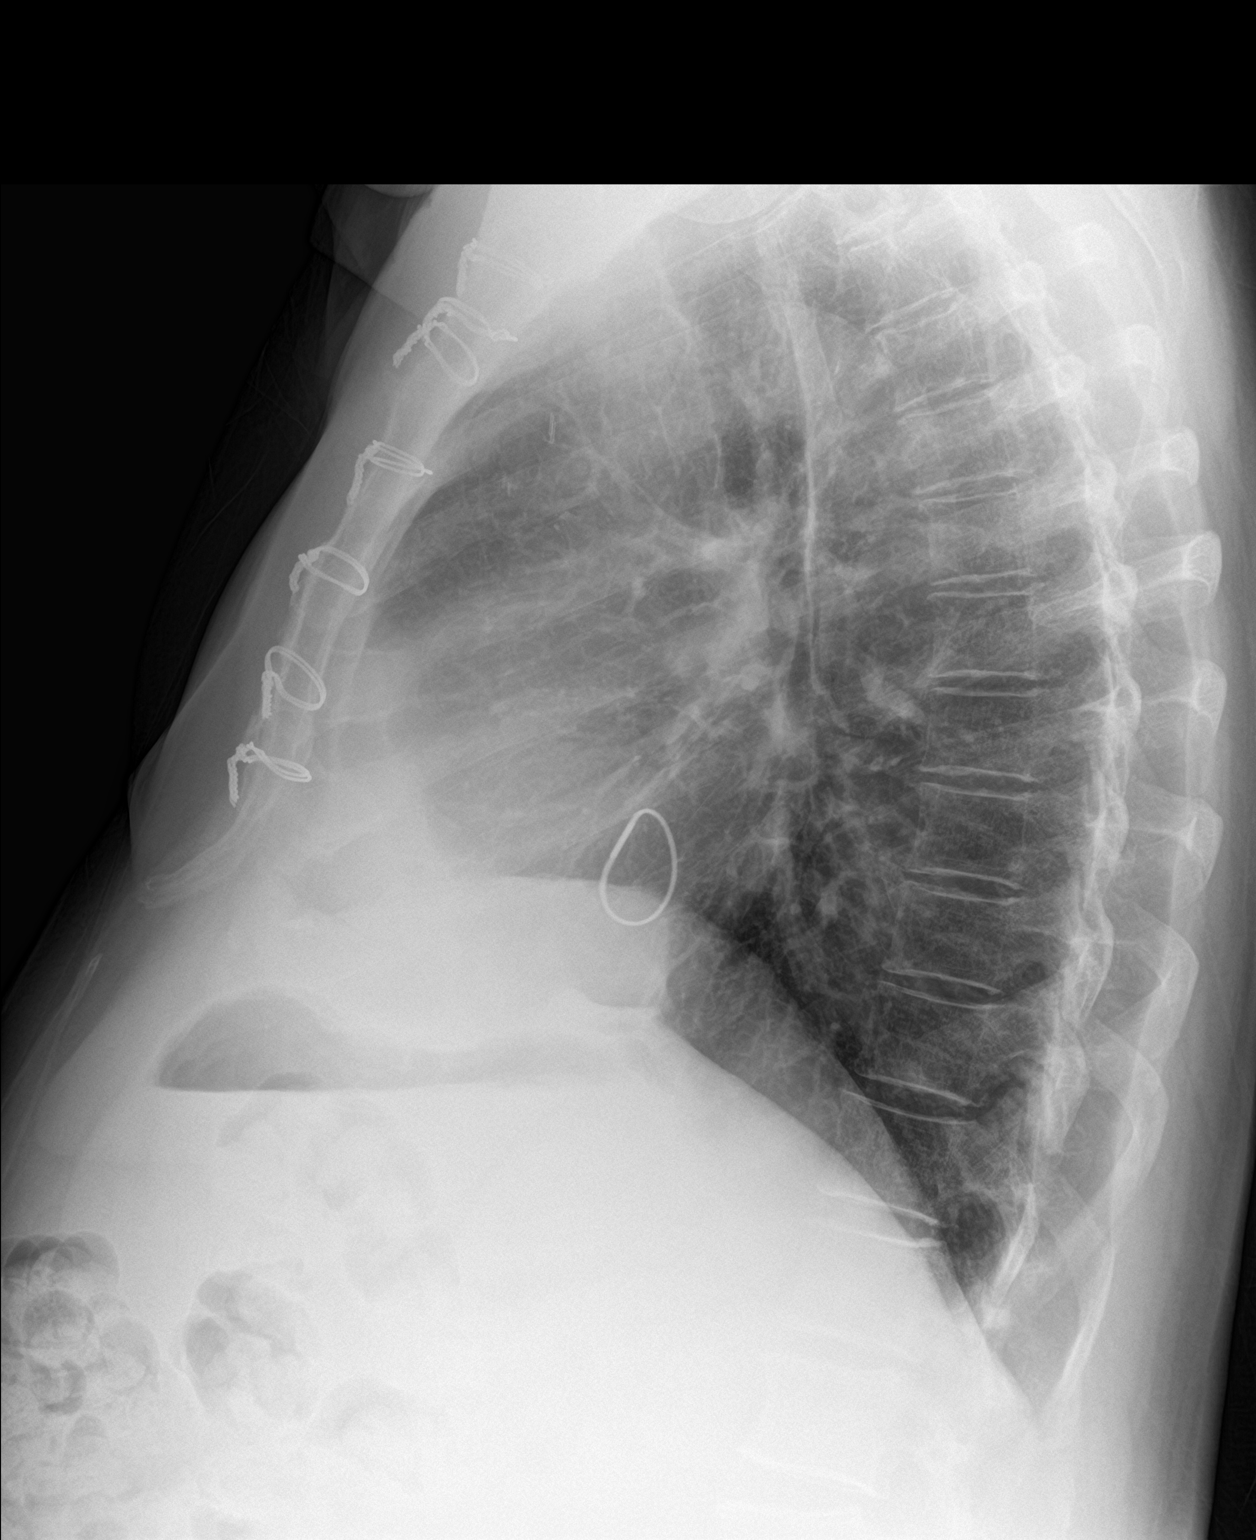

[2 of 2 positions shown; findings below may reference images not displayed]

FINDINGS: Stable cardiomediastinal silhouette. Status post mitral valve
repair. No pneumothorax or pleural effusion noted. Both lungs are
clear. The visualized skeletal structures are unremarkable.
IMPRESSION: No active cardiopulmonary disease.

## 2019-08-19 ENCOUNTER — Telehealth: Payer: Self-pay | Admitting: Internal Medicine

## 2019-08-19 NOTE — Telephone Encounter (Signed)
If the cough never responded to GERD treatment this time (according to my notes it did previously) then needs ov to regroup.  Ok to add to [place] schedule any day in two weeks if nothing else open and first try delsym 2 tsp every 12 hours as needed, the strongest non narcotic available

## 2019-08-19 NOTE — Telephone Encounter (Signed)
Spoke with patient. He stated that he normally has a cough that is productive but for some reason this current cough is not productive. He denied any increased SOB, chest pain or fevers. He stated that he was seen back in April was told to follow up in 3 months. His follow up visit is scheduled for August 2021 due to no office availability at the moment.   He is still taking Protonix and Pepcid as prescribed.   Pharmacy is [facility] [place].   MW, please advise. Thanks!

## 2019-08-20 NOTE — Telephone Encounter (Signed)
Patient called and agrees to try the [facility], he will call back to make an appointment with [name] if his symptoms are not better. [name] has agreed to work him in if he ask to be seen.

## 2019-08-20 NOTE — Telephone Encounter (Signed)
LMTCB x 1 

## 2019-08-20 NOTE — Telephone Encounter (Signed)
Pt called back, please return call  

## 2019-08-20 NOTE — Telephone Encounter (Signed)
lmtcb for pt.  

## 2019-09-11 ENCOUNTER — Other Ambulatory Visit: Payer: Self-pay | Admitting: Internal Medicine

## 2019-09-11 DIAGNOSIS — R911 Solitary pulmonary nodule: Secondary | ICD-10-CM

## 2019-09-27 ENCOUNTER — Other Ambulatory Visit: Payer: Self-pay | Admitting: Internal Medicine

## 2019-10-02 ENCOUNTER — Ambulatory Visit (INDEPENDENT_AMBULATORY_CARE_PROVIDER_SITE_OTHER)
Admission: RE | Admit: 2019-10-02 | Discharge: 2019-10-02 | Disposition: A | Discharge status: Home / Self Care | Payer: Medicare HMO | Source: Ambulatory Visit | Attending: Internal Medicine | Admitting: Internal Medicine

## 2019-10-02 ENCOUNTER — Other Ambulatory Visit: Payer: Self-pay

## 2019-10-02 DIAGNOSIS — R911 Solitary pulmonary nodule: Secondary | ICD-10-CM | POA: Diagnosis not present

## 2019-10-03 NOTE — Progress Notes (Signed)
Spoke with pt and notified of results per Dr. Wert. Pt verbalized understanding and denied any questions. 

## 2019-10-07 ENCOUNTER — Ambulatory Visit: Payer: Medicare HMO | Admitting: Internal Medicine

## 2019-10-11 ENCOUNTER — Other Ambulatory Visit: Payer: Self-pay | Admitting: Internal Medicine

## 2019-11-11 DIAGNOSIS — Z7189 Other specified counseling: Secondary | ICD-10-CM | POA: Insufficient documentation

## 2019-11-11 NOTE — Progress Notes (Signed)
Cardiology Office Note   Date:  11/12/2019   ID:  [name], DOB Jul 10, 1950, MRN [id number]  PCP:  Blair Heys, PA-C  Cardiologist:   Minus Breeding, MD    Chief Complaint  Patient presents with   Mitral Regurgitation      History of Present Illness: [name] is a 69 y.o. male who presents for for follow up of lower extremity edema and MR.   Since I last saw him he has had no new cardiovascular problems.  He retired from [employer].  However, last year he was really bored.  Now he is delivering ice. The patient denies any new symptoms such as chest discomfort, neck or arm discomfort. There has been no new shortness of breath, PND or orthopnea. There have been no reported palpitations, presyncope or syncope.   Past Medical History:  Diagnosis Date   Anxiety    Atrial fibrillation ([place])    postoperative   BPH (benign prostatic hypertrophy)    Chronic otitis media    left, with chronic TM perforation; also with bilateral conductive hearing loss (sees Dr. [name] at [facility])   CKD (chronic kidney disease), stage II 2015   Borderline stage II/III: CrCl@60  ml/min   Gout    History of non anemic vitamin B12 deficiency 2012   Hypogonadism male 08/2010   Axiron started--?compliance? (repeat value 01/2011 mildly improved).   Mitral regurgitation 04/30/2012   Mitral Valve Prolapse with Severe Mitral Regurgitation    a. 01/2006 s/p MV Repair w/ 30 mm Edwards physio ring annuloplasty;  b. 06/2013 Echo: Ef 60-65%, no rwma, triv AI, dilated Ao root (76mm), mild to mod MS, mild MR, mildly dil LA.   Obesity    Sleep apnea 07/04/2016   Urge incontinence    Viral URI 02/18/2014    Past Surgical History:  Procedure Laterality Date   COLONOSCOPY  2003   Normal per pt: Dr. Anthony Sar in [place]  2000   doing fine since   MITRAL VALVE REPAIR  2007   Dr. [name]   TRANSTHORACIC ECHOCARDIOGRAM  06/2012; 06/2013   moderate MR (MV repair)--stable  compared to prior echo and pt is asymptomatic.  Repeat echo 06/2013: Stable MV repair.  Mild regurg.  Mild AO root dilatation. No change in therapy.     Current Outpatient Medications  Medication Sig Dispense Refill   allopurinol (ZYLOPRIM) 100 MG tablet Take 100 mg by mouth daily.      aspirin 81 MG tablet Take 81 mg by mouth daily.       dicyclomine (BENTYL) 10 MG capsule Take by mouth.     famotidine (PEPCID) 20 MG tablet One after supper 30 tablet 11   Homeopathic Products (ALLERGY MEDICINE PO) Take by mouth.     metoprolol succinate (TOPROL-XL) 25 MG 24 hr tablet Take 1 tablet (25 mg total) by mouth daily. 90 tablet 3   pantoprazole (PROTONIX) 40 MG tablet TAKE 1 TABLET (40 MG TOTAL) BY MOUTH DAILY. TAKE 30-60 MINUTES BEFORE FIRST MEAL OF THE DAY 90 tablet 1   raNITIdine HCl (ACID REDUCER PO) Take by mouth.     UNABLE TO FIND Med Name: CPAP with sleep     No current facility-administered medications for this visit.    Allergies:   Ivp dye [iodinated diagnostic agents] and Prednisone     ROS:  Please see the history of present illness.   Otherwise, review of systems are positive for ringing in  his ears.   All other systems are reviewed and negative.    PHYSICAL EXAM: VS:  BP 112/78    Pulse 82    Ht 5\' 6"  (1.676 m)    Wt 197 lb 6.4 oz (89.5 kg)    SpO2 92%    BMI 31.86 kg/m  , BMI Body mass index is 31.86 kg/m.  GENERAL:  Well appearing NECK:  No jugular venous distention, waveform within normal limits, carotid upstroke brisk and symmetric, no bruits, no thyromegaly LUNGS:  Clear to auscultation bilaterally CHEST:  Well healed sternotomy scar. HEART:  PMI not displaced or sustained,S1 and S2 within normal limits, no S3, no S4, no clicks, no rubs, no murmurs ABD:  Flat, positive bowel sounds normal in frequency in pitch, no bruits, no rebound, no guarding, no midline pulsatile mass, no hepatomegaly, no splenomegaly EXT:  2 plus pulses throughout, no edema, no cyanosis  no clubbing   EKG:  EKG is  ordered today. Sinus rhythm, rate 77, axis within normal limits, intervals within normal limits, incomplete right bundle branch block, leftward axis, nonspecific anterior T wave changes.  Recent Labs: 06/13/2019: ALT 92; BUN 16; Creatinine, Ser 1.19; Hemoglobin 16.2; Platelets 187.0; Potassium 4.1; Sodium 141; TSH 2.97    Lipid Panel    Component Value Date/Time   CHOL 224 (H) 06/01/2012 0834   TRIG 108.0 06/01/2012 0834   HDL 28.10 (L) 06/01/2012 0834   CHOLHDL 8 06/01/2012 0834   VLDL 21.6 06/01/2012 0834   LDLDIRECT 180.9 06/01/2012 0834      Wt Readings from Last 3 Encounters:  11/12/19 197 lb 6.4 oz (89.5 kg)  07/05/19 200 lb 9.6 oz (91 kg)  06/13/19 200 lb (90.7 kg)      Other studies Reviewed: Additional studies/ records that were reviewed today include:  None. Review of the above records demonstrates:     ASSESSMENT AND PLAN:    MV REPAIR:     He is overdue for echocardiogram and I will order this.  Provided this is stable no change in therapy.  He understands endocarditis prophylaxis.    SLEEP APNEA:  He could not tolerate CPAP.  No change in therapy.   COVID EDUCATION: The patient has not been vaccinated.  We had a long discussion about this.   Current medicines are reviewed at length with the patient today.  The patient does not have concerns regarding medicines.  The following changes have been made:  None  Labs/ tests ordered today include: None  Orders Placed This Encounter  Procedures   EKG 12-Lead   ECHOCARDIOGRAM COMPLETE     Disposition:   FU with me in 12 months.     Signed, Minus Breeding, MD  11/12/2019 4:06 PM    [facility]

## 2019-11-12 ENCOUNTER — Ambulatory Visit (INDEPENDENT_AMBULATORY_CARE_PROVIDER_SITE_OTHER): Payer: Medicare HMO | Admitting: Cardiology

## 2019-11-12 ENCOUNTER — Other Ambulatory Visit: Payer: Self-pay

## 2019-11-12 ENCOUNTER — Encounter: Payer: Self-pay | Admitting: Cardiology

## 2019-11-12 VITALS — BP 112/78 | HR 82 | Ht 66.0 in | Wt 197.4 lb

## 2019-11-12 DIAGNOSIS — I340 Nonrheumatic mitral (valve) insufficiency: Secondary | ICD-10-CM

## 2019-11-12 DIAGNOSIS — G4733 Obstructive sleep apnea (adult) (pediatric): Secondary | ICD-10-CM | POA: Diagnosis not present

## 2019-11-12 DIAGNOSIS — Z9889 Other specified postprocedural states: Secondary | ICD-10-CM

## 2019-11-12 DIAGNOSIS — Z7189 Other specified counseling: Secondary | ICD-10-CM

## 2019-11-12 NOTE — Patient Instructions (Signed)
Medication Instructions:  The current medical regimen is effective;  continue present plan and medications.  *If you need a refill on your cardiac medications before your next appointment, please call your pharmacy*   Testing/Procedures: Echocardiogram - Your physician has requested that you have an echocardiogram. Echocardiography is a painless test that uses sound waves to create images of your heart. It provides your doctor with information about the size and shape of your heart and how well your heart's chambers and valves are working. This procedure takes approximately one hour. There are no restrictions for this procedure. This will be performed at our [address] location - [address], [address].    Follow-Up: At [facility], you and your health needs are our priority.  As part of our continuing mission to provide you with exceptional heart care, we have created designated Provider Care Teams.  These Care Teams include your primary Cardiologist (physician) and Advanced Practice Providers (APPs -  Physician Assistants and Nurse Practitioners) who all work together to provide you with the care you need, when you need it.  We recommend signing up for the patient portal called "MyChart".  Sign up information is provided on this After Visit Summary.  MyChart is used to connect with patients for Virtual Visits (Telemedicine).  Patients are able to view lab/test results, encounter notes, upcoming appointments, etc.  Non-urgent messages can be sent to your provider as well.   To learn more about what you can do with MyChart, go to [email or web address].    Your next appointment:   12 month(s)  The format for your next appointment:   In Person  Provider:   Raegan Hochrein, MD   

## 2019-11-22 ENCOUNTER — Other Ambulatory Visit: Payer: Self-pay

## 2019-11-22 ENCOUNTER — Ambulatory Visit: Payer: Medicare HMO | Admitting: Internal Medicine

## 2019-11-22 ENCOUNTER — Encounter: Payer: Self-pay | Admitting: Internal Medicine

## 2019-11-22 DIAGNOSIS — R05 Cough: Secondary | ICD-10-CM

## 2019-11-22 DIAGNOSIS — R911 Solitary pulmonary nodule: Secondary | ICD-10-CM | POA: Diagnosis not present

## 2019-11-22 DIAGNOSIS — R058 Other specified cough: Secondary | ICD-10-CM

## 2019-11-22 MED ORDER — PANTOPRAZOLE SODIUM 40 MG PO TBEC: 40.0000 mg | DELAYED_RELEASE_TABLET | Freq: Every day | ORAL | 2 refills | Status: DC

## 2019-11-22 MED ORDER — FAMOTIDINE 20 MG PO TABS: ORAL_TABLET | ORAL | 11 refills | Status: DC

## 2019-11-22 NOTE — Progress Notes (Signed)
[name], male    DOB: March 26, 1950,   MRN: [id number]     Brief patient profile:  60  yowm never smoker from [place] perfectly healthy until new cough around fall 2017 - symptoms daily then sob/ wheeze 2018 no better with inhalers or steroids  So self-referred 11/21/2017     History of Present Illness  11/21/2017  Pulmonary / 1st office eval  Chief Complaint  Patient presents with  . Pulmonary Consult    Self referral.  Pt c/o cough for the past 2 yrs, worse with wheezing over the past 2 months. He occ will produce some clear sputum.   Dyspnea:  Not limited by breathing from desired activities   Cough: see below  Sleep: cough better when sleeping on 2 pillows / cpap started 11/10/17  SABA use: no better Some am excess mucus runny out of nose   [name] v Neurogenic Cough Differentiator Reflux Comments  Do you awaken from a sound sleep coughing violently?                            With trouble breathing? no   Do you have choking episodes when you cannot  Get enough air, gasping for air ?              Sometimes like gagging but no vomit   Do you usually cough when you lie down into  The bed, or when you just lie down to rest ?                          no   Do you usually cough after meals or eating?         No    Do you cough when (or after) you bend over?    no   GERD SCORE     [name] v Neurogenic Cough Differentiator Neurogenic   Do you more-or-less cough all day long? Pretty constant   Does change of temperature make you cough? no   Does laughing or chuckling cause you to cough? yes   Do fumes (perfume, automobile fumes, burned  Toast, etc.,) cause you to cough ?      No    Does speaking, singing, or talking on the phone cause you to cough   ?               no   Neurogenic/Airway score      rec Stop loratadine and start zyrtec 10 mg one at bedtime instead  Pantoprazole (protonix) 40 mg   Take  30-60 min before first meal of the day and Pepcid (famotidine)  20  mg one after supper   until return to office - this is the best way to tell whether stomach acid is contributing to your problem. GERD diet  Please remember to go to the lab and x-ray department downstairs in the basement  for your tests - we will call you with the results when they are available. Please schedule a follow up office visit in 4 weeks, sooner if needed  with all medications /inhalers/ solutions in hand so we can verify exactly what you are taking. This includes all medications from all doctors and over the counters    12/20/2017  f/u ov/Gaelle Adriance re: uacs/ marked improvement with rx for gerd  Chief Complaint  Patient presents with  . Follow-up    Cough is  much improved. He states he has slight PND on the am's.   Dyspnea:  Not limited by breathing from desired activities   Cough: resolved, min am sense of pnds Sleeping: ok for about 3h then cpap comes off  "don't know why masks come off in sleep as no recollection of removing it"  SABA use: no 02: no   rec No change    07/05/2019  Consultation Kevan Prouty re:     CT abd c/w 9 mm  RLL  Chief Complaint  Patient presents with  . Consult  Dyspnea:  Not limited by breathing from desired activities   Cough: day > noct, dry worse off gerd rx Sleeping: fine flat / 2 pillows  SABA use: none 02: none  rec Pantoprazole (protonix) 40 mg   Take  30-60 min before first meal of the day and Pepcid (famotidine)  20 mg after supper    GERD  Diet  We will call you to set up your follow up CT  prior to your next visit in 3 months    11/22/2019  f/u ov/Dianne Bady re:  Cough x 2017 ? p cabg/ spn  Chief Complaint  Patient presents with  . Follow-up    Cough is unchanged. He only took pantoprazole and pepcid for 1 month and then stopped.   Dyspnea:  No problem with doe  Cough: mostly dry day> night, note had mostly resolved 12/20/17 p rx for gerd and pnds  Sleeping:  Bed is flat, 2 pillows, does not wake up coughing  SABA use: none  02: none  Using  lots of mints daytime     No obvious day to day or daytime variability or assoc excess/ purulent sputum or mucus plugs or hemoptysis or cp or chest tightness, subjective wheeze or overt sinus or hb symptoms.   Sleeping as above  without nocturnal  or early am exacerbation  of respiratory  c/o's or need for noct saba. Also denies any obvious fluctuation of symptoms with weather or environmental changes or other aggravating or alleviating factors except as outlined above   No unusual exposure hx or h/o childhood pna/ asthma or knowledge of premature birth.  Current Allergies, Complete Past Medical History, Past Surgical History, Family History, and Social History were reviewed in [facility] record.  ROS  The following are not active complaints unless bolded Hoarseness, sore throat, dysphagia, dental problems, itching, sneezing,  nasal congestion or discharge of excess mucus or purulent secretions, ear ache,   fever, chills, sweats, unintended wt loss or wt gain, classically pleuritic or exertional cp,  orthopnea pnd or arm/hand swelling  or leg swelling, presyncope, palpitations, abdominal pain, anorexia, nausea, vomiting, diarrhea  or change in bowel habits or change in bladder habits, change in stools or change in urine, dysuria, hematuria,  rash, arthralgias, visual complaints, headache, numbness, weakness or ataxia or problems with walking or coordination,  change in mood or  memory.        Current Meds  Medication Sig  . allopurinol (ZYLOPRIM) 100 MG tablet Take 100 mg by mouth daily.   Marland Kitchen aspirin 81 MG tablet Take 81 mg by mouth daily.    . Homeopathic Products (ALLERGY MEDICINE PO) Take by mouth.  . metoprolol succinate (TOPROL-XL) 25 MG 24 hr tablet Take 1 tablet (25 mg total) by mouth daily.               Objective:    11/22/2019       198  07/05/2019       200 12/20/2017     200   11/21/17 205 lb (93 kg)  09/27/17 200 lb (90.7 kg)  08/31/17 200 lb (90.7  kg)      amb wm nad dry hacking cough   Vital signs reviewed  11/22/2019  - Note at rest 02 sats  97% on RA   HEENT : pt wearing mask not removed for exam due to covid -19 concerns.    NECK :  without JVD/Nodes/TM/ nl carotid upstrokes bilaterally   LUNGS: no acc muscle use,  Nl contour chest which is clear to A and P bilaterally without cough on insp or exp maneuvers   CV:  RRR  no s3 or murmur or increase in P2, and no edema   ABD:  soft and nontender with nl inspiratory excursion in the supine position. No bruits or organomegaly appreciated, bowel sounds nl  MS:  Nl gait/ ext warm without deformities, calf tenderness, cyanosis or clubbing No obvious joint restrictions   SKIN: warm and dry without lesions    NEURO:  alert, approp, nl sensorium with  no motor or cerebellar deficits apparent.                Assessment

## 2019-11-22 NOTE — Patient Instructions (Addendum)
Pantoprazole (protonix) 40 mg   Take  30-60 min before first meal of the day and Pepcid (famotidine)  20 mg one @  bedtime until return to office - this is the best way to tell whether stomach acid is contributing to your problem.    Zyrtec 10 mg  In am and For drainage / throat tickle try take CHLORPHENIRAMINE  4 mg  (Chlortab 4mg   at [employer] should be easiest to find in the green box)  just  dose or two an hour before bedtime   GERD (REFLUX)  is an extremely common cause of respiratory symptoms just like yours , many times with no obvious heartburn at all.    It can be treated with medication, but also with lifestyle changes including elevation of the head of your bed (ideally with 6 -8inch blocks under the headboard of your bed),  Smoking cessation, avoidance of late meals, excessive alcohol, and avoid fatty foods, chocolate, peppermint, colas, red wine, and acidic juices such as orange juice.  NO MINT OR MENTHOL PRODUCTS SO NO COUGH DROPS  USE SUGARLESS CANDY INSTEAD (Jolley ranchers or Stover's or Life Savers) or even ice chips will also do - the key is to swallow to prevent all throat clearing. NO OIL BASED VITAMINS - use powdered substitutes.  Avoid fish oil when coughing   For cough >  Delsym 2 tsp every 12 hours as needed   I will have our office contact in January 2022 for the follow up ct    I very strongly recommend you get the moderna or pfizer vaccine as soon as possible based on your risk of dying from the virus  and the proven safety and benefit of these vaccines against even the delta variant.  This can save your life as well as  those of your loved ones,  especially if they are also not vaccinated.      Please schedule a follow up office visit in 6 weeks, call sooner if needed with all medications /inhalers/ solutions in hand so we can verify exactly what you are taking. This includes all medications from all doctors and over the counters

## 2019-11-23 ENCOUNTER — Encounter: Payer: Self-pay | Admitting: Internal Medicine

## 2019-11-23 NOTE — Assessment & Plan Note (Signed)
Never smoker - incidental finding on CT abd 06/17/19  10 x 8 mm RLL  - CT 10/02/2019  1. 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   Although 4 months of stability is reassuring this nodule remains Indeterminate.  - f/u CT 04/03/20 placed in reminder file    CT results reviewed with pt >>> borderline size  for PET  not suspicious enough for excisional bx > really only option for now is follow the Fleischner society guidelines as rec by radiology.   Discussed in detail all the  indications, usual  risks and alternatives  relative to the benefits with patient who agrees to proceed with w/u as outlined.      Medical decision making was a moderate level of complexity in this case because of  two chronic conditions /diagnoses requiring extra time for  H and P, chart review, counseling,   and generating customized AVS unique to this office visit and charting.   Each maintenance medication was reviewed in detail including emphasizing most importantly the difference between maintenance and prns and under what circumstances the prns are to be triggered using an action plan format where appropriate. Please see avs for details which were reviewed in writing by both me and my nurse and patient given a written copy highlighted where appropriate with yellow highlighter for the patient's continued care at home along with an updated version of their medications.  Patient was asked to maintain medication reconciliation by comparing this list to the actual medications being used at home and to contact this office right away if there is a conflict or discrepancy.     In meantime : Pt informed of the seriousness of COVID 19 infection as a direct risk to lung health  and safey and to close contacts and should continue to wear a facemask in public and minimize exposure to public locations but especially avoid any area or activity where non-close contacts are not observing distancing or wearing  an appropriate face mask.  I strongly recommended she take either of the vaccines available through local drugstores based on updated information on millions of Americans treated with the [place] products  which have proven both safe and  effective even against the new delta variant.     Medical decision making was a moderate level of complexity in this case because of  two separate chronic conditions /diagnoses requiring extra time for  H and P, chart review, counseling,   and generating customized AVS unique to this office visit and charting.   Each maintenance medication was reviewed in detail including emphasizing most importantly the difference between maintenance and prns and under what circumstances the prns are to be triggered using an action plan format where appropriate. Please see avs for details which were reviewed in writing by both me and my nurse and patient given a written copy highlighted where appropriate with yellow highlighter for the patient's continued care at home along with an updated version of their medications.  Patient was asked to maintain medication reconciliation by comparing this list to the actual medications being used at home and to contact this office right away if there is a conflict or discrepancy.

## 2019-11-23 NOTE — Assessment & Plan Note (Signed)
Onset Fall 2017  FENO 11/21/2017  =   Machine malfunctioned - Allergy profile 11/21/2017 >  Eos 0.4 /  IgE  57 RAST neg  - marked improvement 12/20/2017 on gerd rx - worse 07/05/2019 off gerd rx  - 11/22/2019 restart gerd diet/ 1st gen H1 blockers per guidelines  / stop mints and use delsym otc   Of the three most common causes of  Sub-acute / recurrent or chronic cough, only one (GERD)  can actually contribute to/ trigger  the other two (asthma and post nasal drip syndrome)  and perpetuate the cylce of cough.  While not intuitively obvious, many patients with chronic low grade reflux do not cough until there is a primary insult that disturbs the protective epithelial barrier and exposes sensitive nerve endings.   This is typically viral but can due to PNDS and  either may apply here.   The point is that once this occurs, it is difficult to eliminate the cycle  using anything but a maximally effective acid suppression regimen at least in the short run, accompanied by an appropriate diet to address non acid GERD (NO MINT!) and pnds with 1st gen H1 blockers per guidelines plus  control / eliminate the cough itself for at least 3 days with delsym and if not effective consider adding gabapentin next

## 2019-11-25 ENCOUNTER — Other Ambulatory Visit: Payer: Self-pay

## 2019-11-25 ENCOUNTER — Ambulatory Visit (HOSPITAL_COMMUNITY): Payer: Medicare HMO | Attending: Cardiology

## 2019-11-25 DIAGNOSIS — I340 Nonrheumatic mitral (valve) insufficiency: Secondary | ICD-10-CM | POA: Insufficient documentation

## 2019-11-25 LAB — ECHOCARDIOGRAM COMPLETE
Area-P 1/2: 1.66 cm2
P 1/2 time: 579 msec
S' Lateral: 3.3 cm

## 2019-12-23 ENCOUNTER — Telehealth: Payer: Self-pay | Admitting: Cardiology

## 2019-12-23 NOTE — Telephone Encounter (Signed)
*  STAT* If patient is at the pharmacy, call can be transferred to refill team.   1. Which medications need to be refilled? (please list name of each medication and dose if known) dicyclomine    2. Which pharmacy/location (including street and city if local pharmacy) is medication to be sent to? [facility]  3. Do they need a 30 day or 90 day supply?  Not sure

## 2019-12-25 NOTE — Telephone Encounter (Signed)
I don't see what med they are asking for?

## 2019-12-28 NOTE — Telephone Encounter (Signed)
I don't prescribe this medication

## 2020-01-01 ENCOUNTER — Encounter: Payer: Self-pay | Admitting: Internal Medicine

## 2020-01-01 ENCOUNTER — Ambulatory Visit: Payer: Medicare HMO | Admitting: Internal Medicine

## 2020-01-01 ENCOUNTER — Other Ambulatory Visit: Payer: Self-pay

## 2020-01-01 DIAGNOSIS — R058 Other specified cough: Secondary | ICD-10-CM | POA: Diagnosis not present

## 2020-01-01 DIAGNOSIS — K219 Gastro-esophageal reflux disease without esophagitis: Secondary | ICD-10-CM | POA: Diagnosis not present

## 2020-01-01 MED ORDER — FAMOTIDINE 20 MG PO TABS: ORAL_TABLET | ORAL | 11 refills | Status: DC

## 2020-01-01 MED ORDER — GABAPENTIN 100 MG PO CAPS: 100.0000 mg | ORAL_CAPSULE | Freq: Four times a day (QID) | ORAL | 2 refills | Status: DC

## 2020-01-01 MED ORDER — DICYCLOMINE HCL 10 MG PO CAPS: 10.0000 mg | ORAL_CAPSULE | Freq: Three times a day (TID) | ORAL | 2 refills | Status: DC

## 2020-01-01 NOTE — Assessment & Plan Note (Addendum)
Cough reported worse off ppi 07/05/19 but abd cramping on ppi reported 01/01/2020  - 01/01/2020  try off ppi and on h2 bid to see if abd symptoms improve and f/u in 6 weeks   Pt informed of the seriousness of COVID 19 infection as a direct risk to lung health  and safey and to close contacts and should continue to wear a facemask in public and minimize exposure to public locations but especially avoid any area or activity where non-close contacts are not observing distancing or wearing an appropriate face mask.  I strongly recommended she take either of the vaccines available through local drugstores based on updated information on millions of Americans treated with the [place] products  which have proven both safe and  effective even against the new delta variant.         Each maintenance medication was reviewed in detail including emphasizing most importantly the difference between maintenance and prns and under what circumstances the prns are to be triggered using an action plan format where appropriate.  Total time for H and P, chart review, counseling,  and generating customized AVS unique to this office visit / charting = 15min

## 2020-01-01 NOTE — Progress Notes (Signed)
[name], male    DOB: March 26, 1950,   MRN: [id number]     Brief patient profile:  60  yowm never smoker from [place] perfectly healthy until new cough around fall 2017 - symptoms daily then sob/ wheeze 2018 no better with inhalers or steroids  So self-referred 11/21/2017     History of Present Illness  11/21/2017  Pulmonary / 1st office eval  Chief Complaint  Patient presents with  . Pulmonary Consult    Self referral.  Pt c/o cough for the past 2 yrs, worse with wheezing over the past 2 months. He occ will produce some clear sputum.   Dyspnea:  Not limited by breathing from desired activities   Cough: see below  Sleep: cough better when sleeping on 2 pillows / cpap started 11/10/17  SABA use: no better Some am excess mucus runny out of nose   [name] v Neurogenic Cough Differentiator Reflux Comments  Do you awaken from a sound sleep coughing violently?                            With trouble breathing? no   Do you have choking episodes when you cannot  Get enough air, gasping for air ?              Sometimes like gagging but no vomit   Do you usually cough when you lie down into  The bed, or when you just lie down to rest ?                          no   Do you usually cough after meals or eating?         No    Do you cough when (or after) you bend over?    no   GERD SCORE     [name] v Neurogenic Cough Differentiator Neurogenic   Do you more-or-less cough all day long? Pretty constant   Does change of temperature make you cough? no   Does laughing or chuckling cause you to cough? yes   Do fumes (perfume, automobile fumes, burned  Toast, etc.,) cause you to cough ?      No    Does speaking, singing, or talking on the phone cause you to cough   ?               no   Neurogenic/Airway score      rec Stop loratadine and start zyrtec 10 mg one at bedtime instead  Pantoprazole (protonix) 40 mg   Take  30-60 min before first meal of the day and Pepcid (famotidine)  20  mg one after supper   until return to office - this is the best way to tell whether stomach acid is contributing to your problem. GERD diet  Please remember to go to the lab and x-ray department downstairs in the basement  for your tests - we will call you with the results when they are available. Please schedule a follow up office visit in 4 weeks, sooner if needed  with all medications /inhalers/ solutions in hand so we can verify exactly what you are taking. This includes all medications from all doctors and over the counters    12/20/2017  f/u ov/Dimond Crotty re: uacs/ marked improvement with rx for gerd  Chief Complaint  Patient presents with  . Follow-up    Cough is  much improved. He states he has slight PND on the am's.   Dyspnea:  Not limited by breathing from desired activities   Cough: resolved, min am sense of pnds Sleeping: ok for about 3h then cpap comes off  "don't know why masks come off in sleep as no recollection of removing it"  SABA use: no 02: no   rec No change    07/05/2019  Consultation Pelagia Iacobucci re:     CT abd c/w 9 mm  RLL  Chief Complaint  Patient presents with  . Consult  Dyspnea:  Not limited by breathing from desired activities   Cough: day > noct, dry worse off gerd rx Sleeping: fine flat / 2 pillows  SABA use: none 02: none  rec Pantoprazole (protonix) 40 mg   Take  30-60 min before first meal of the day and Pepcid (famotidine)  20 mg after supper    GERD  Diet  We will call you to set up your follow up CT  prior to your next visit in 3 months    11/22/2019  f/u ov/Telia Amundson re:  Cough x 2017 ? p cabg/ spn  Chief Complaint  Patient presents with  . Follow-up    Cough is unchanged. He only took pantoprazole and pepcid for 1 month and then stopped.   Dyspnea:  No problem with doe  Cough: mostly dry day> night, note had mostly resolved 12/20/17 p rx for gerd and pnds  Sleeping:  Bed is flat, 2 pillows, does not wake up coughing  SABA use: none  02: none  Using  lots of mints daytime  rec  Pantoprazole (protonix) 40 mg   Take  30-60 min before first meal of the day and Pepcid (famotidine)  20 mg one @  bedtime until return to office - this is the best way to tell whether stomach acid is contributing to your problem.   Zyrtec 10 mg  In am and For drainage / throat tickle try take CHLORPHENIRAMINE  4 mg  (Chlortab 4mg   at [employer] should be easiest to find in the green box)  just  dose or two an hour before bedtime  GERD (REFLUX) For cough >  Delsym 2 tsp every 12 hours as needed  I will have our office contact in January 2022 for the follow up ct Please schedule a follow up office visit in 6 weeks, call sooner if needed with all medications /inhalers/ solutions in hand so we can verify exactly what you are taking. This includes all medications from all doctors and over the counters    01/01/2020  f/u ov/Alya Smaltz re: cough  2017 better on reflux/ pnds / did not bring meds and confused with names  Chief Complaint  Patient presents with  . Follow-up    Cough is some better but not resolved yet. He occ produces some clear sputum. He has runny nose in the am.   Dyspnea:  Not limited by breathing from desired activities   Cough: mostly dry worse p supper but pt not aware of coughing noct (wife says he  Coughs some)  Sleeping: on side/ 2 pillows  No bed blocks SABA use: none  02: none    No obvious day to day or daytime variability or assoc excess/ purulent sputum or mucus plugs or hemoptysis or cp or chest tightness, subjective wheeze or overt sinus or hb symptoms but has developed "sensitive stomach with cramping since starting ppi.     Also denies any  obvious fluctuation of symptoms with weather or environmental changes or other aggravating or alleviating factors except as outlined above   No unusual exposure hx or h/o childhood pna/ asthma or knowledge of premature birth.  Current Allergies, Complete Past Medical History, Past Surgical  History, Family History, and Social History were reviewed in [facility] record.  ROS  The following are not active complaints unless bolded Hoarseness, sore throat=globus, dysphagia, dental problems, itching, sneezing,  nasal congestion or discharge of excess mucus or purulent secretions, ear ache,   fever, chills, sweats, unintended wt loss or wt gain, classically pleuritic or exertional cp,  orthopnea pnd or arm/hand swelling  or leg swelling, presyncope, palpitations, abdominal pain, anorexia, nausea, vomiting, diarrhea  or change in bowel habits or change in bladder habits, change in stools or change in urine, dysuria, hematuria,  rash, arthralgias, visual complaints, headache, numbness, weakness or ataxia or problems with walking or coordination,  change in mood or  memory.        Current Meds  Medication Sig  . aspirin 81 MG tablet Take 81 mg by mouth daily.    . famotidine (PEPCID) 20 MG tablet One after supper  . Homeopathic Products (ALLERGY MEDICINE PO) Take by mouth.  . metoprolol succinate (TOPROL-XL) 25 MG 24 hr tablet Take 1 tablet (25 mg total) by mouth daily.  . pantoprazole (PROTONIX) 40 MG tablet Take 1 tablet (40 mg total) by mouth daily. Take 30-60 min before first meal of the day                 Objective:     01/01/2020       11/22/2019       198  07/05/2019       200 12/20/2017     200   11/21/17 205 lb (93 kg)  09/27/17 200 lb (90.7 kg)  08/31/17 200 lb (90.7 kg)     amb wm quite pleasant but very easily confused with details of care, names of meds    Vital signs reviewed  01/01/2020  - Note at rest 02 sats  96% on RA   HEENT : pt wearing mask not removed for exam due to covid -19 concerns.    NECK :  without JVD/Nodes/TM/ nl carotid upstrokes bilaterally   LUNGS: no acc muscle use,  Nl contour chest which is clear to A and P bilaterally without cough on insp or exp maneuvers   CV:  RRR  no s3 or murmur or increase in P2, and  no edema   ABD:  soft and nontender with nl inspiratory excursion in the supine position. No bruits or organomegaly appreciated, bowel sounds nl  MS:  Nl gait/ ext warm without deformities, calf tenderness, cyanosis or clubbing No obvious joint restrictions   SKIN: warm and dry without lesions    NEURO:  alert, approp, nl sensorium with  no motor or cerebellar deficits apparent.       .           Assessment

## 2020-01-01 NOTE — Patient Instructions (Addendum)
For drainage / throat tickle try take CHLORPHENIRAMINE  4 mg  (Chlortab 4mg   at [employer] should be easiest to find in the green box)  take one every 4 hours as needed - available over the counter- may cause drowsiness so start with just a bedtime dose or two and see how you tolerate it before trying in daytime   Change pepcid (famotidine)  One twice daily after bfast and after supper   If not improving start gabapentin 100 mg four times day build it up  - this should gradually improve your sensation of something stuck in your throat.    I very strongly recommend you get the moderna or pfizer vaccine as soon as possible based on your risk of dying from the virus  and the proven safety and benefit of these vaccines against even the delta variant.  This can save your life as well as  those of your loved ones,  especially if they are also not vaccinated.      Please schedule a follow up office visit in 6 weeks, call sooner if needed with all medications /inhalers/ solutions in hand so we can verify exactly what you are taking. This includes all medications from all doctors and over the counters Add: stop protonix (pantoprazole)

## 2020-01-01 NOTE — Assessment & Plan Note (Addendum)
Onset Fall 2017  FENO 11/21/2017  =   Machine malfunctioned - Allergy profile 11/21/2017 >  Eos 0.4 /  IgE  57 RAST neg  - marked improvement 12/20/2017 on gerd rx - worse 07/05/2019 off gerd rx  - 11/22/2019 restart gerd diet/ 1st gen H1 blockers per guidelines  / stop mints and use delsym otc > some better but still globus   Of the three most common causes of  Sub-acute / recurrent or chronic cough, only one (GERD)  can actually contribute to/ trigger  the other two (asthma and post nasal drip syndrome)  and perpetuate the cylce of cough.  While not intuitively obvious, many patients with chronic low grade reflux do not cough until there is a primary insult that disturbs the protective epithelial barrier and exposes sensitive nerve endings.   This is typically viral but can due to PNDS and  either may apply here.   The point is that once this occurs, it is difficult to eliminate the cycle  using anything but a maximally effective acid suppression regimen at least in the short run, accompanied by an appropriate diet to address non acid GERD and control / eliminate the pnds with 1st gen H1 blockers per guidelines  And if not better then start gabapentin trial slowly titrating up to 100 mg qid   Advised: The standardized cough guidelines published in Chest by Lissa Morales in 2006 are still the best available and consist of a multiple step process (up to 12!) , not a single office visit,  and are intended  to address this problem logically,  with an alogrithm dependent on response to empiric treatment at  each progressive step  to determine a specific diagnosis with  minimal addtional testing needed. Therefore if adherence is an issue or can't be accurately verified,  it's very unlikely the standard evaluation and treatment will be successful here.    Furthermore, response to therapy (other than acute cough suppression, which should only be used short term with avoidance of narcotic containing cough syrups  if possible), can be a gradual process for which the patient is not likely to  perceive immediate benefit.  Unlike going to an eye doctor where the best perscription is almost always the first one and is immediately effective, this is almost never the case in the management of chronic cough syndromes. Therefore the patient needs to commit up front to consistently adhere to recommendations  for up to 6 weeks of therapy directed at the likely underlying problem(s) before the response can be reasonably evaluated.   >> f/u in 6 weeks with all meds in hand using a trust but verify approach to confirm accurate Medication  Reconciliation The principal here is that until we are certain that the  patients are doing what we've asked, it makes no sense to ask them to do more.

## 2020-01-03 ENCOUNTER — Telehealth: Payer: Self-pay | Admitting: *Deleted

## 2020-01-03 NOTE — Telephone Encounter (Signed)
Spoke with the pt and notified of recs per Dr Melvyn Novas  He verbalized understanding  He repeated the instructions back to me  He says he already stopped the protonix  Nothing further needed

## 2020-01-03 NOTE — Telephone Encounter (Signed)
-----   Message from Tanda Rockers, MD sent at 01/01/2020  3:55 PM EDT ----- I told him to stop protonix (pantoprazole) but forgot to include it on avs   He gets very easily confused with names of meds.

## 2020-01-12 ENCOUNTER — Other Ambulatory Visit: Payer: Self-pay | Admitting: Internal Medicine

## 2020-02-03 ENCOUNTER — Other Ambulatory Visit: Payer: Self-pay

## 2020-02-03 MED ORDER — METOPROLOL SUCCINATE ER 25 MG PO TB24: 25.0000 mg | ORAL_TABLET | Freq: Every day | ORAL | 3 refills | Status: DC

## 2020-02-14 ENCOUNTER — Encounter: Payer: Self-pay | Admitting: Internal Medicine

## 2020-02-14 ENCOUNTER — Ambulatory Visit: Payer: Medicare HMO | Admitting: Internal Medicine

## 2020-02-14 ENCOUNTER — Other Ambulatory Visit: Payer: Self-pay

## 2020-02-14 DIAGNOSIS — R911 Solitary pulmonary nodule: Secondary | ICD-10-CM | POA: Diagnosis not present

## 2020-02-14 DIAGNOSIS — R058 Other specified cough: Secondary | ICD-10-CM

## 2020-02-14 NOTE — Assessment & Plan Note (Signed)
Onset Fall 2017  FENO 11/21/2017  =   Machine malfunctioned - Allergy profile 11/21/2017 >  Eos 0.4 /  IgE  57 RAST neg  - marked improvement 12/20/2017 on gerd rx - worse 07/05/2019 off gerd rx  - 11/22/2019 restart gerd diet/ 1st gen H1 blockers per guidelines  / stop mints and use delsym otc > some better but still globus > rec trial of gabapentin but never took it   He is satisfied with control on pepcid 20 mg bid/ zyrtec q am and prn 1st gen H1 blockers per guidelines and not interested in referral or trial of rx for irritable larynx syndrome with gagapentin so will just rx with otcs for now and f/u prn flare   Patient is unvaccinated and was informed of the seriousness of COVID 19 infection as a direct risk to lung health as well as safety to close contacts and should continue to wear a facemask in public and minimize exposure to public locations but especially avoid any area or activity where non-close contacts are not observing distancing or wearing an appropriate face mask.  I strongly recommended pt take either of the vaccines available through local drugstores based on updated information on millions of Americans treated with the [place] products  which have proven both safe and  effective even against the new delta variant.

## 2020-02-14 NOTE — Progress Notes (Signed)
[name], male    DOB: 11/26/1950,   MRN: [id number]     Brief patient profile:  89  yowm never smoker from [place] perfectly healthy until new cough around fall 2017 - symptoms daily then sob/ wheeze 2018 no better with inhalers or steroids  So self-referred 11/21/2017     History of Present Illness  11/21/2017  Pulmonary / 1st office eval  Chief Complaint  Patient presents with  . Pulmonary Consult    Self referral.  Pt c/o cough for the past 2 yrs, worse with wheezing over the past 2 months. He occ will produce some clear sputum.   Dyspnea:  Not limited by breathing from desired activities   Cough: see below  Sleep: cough better when sleeping on 2 pillows / cpap started 11/10/17  SABA use: no better Some am excess mucus runny out of nose   [name] v Neurogenic Cough Differentiator Reflux Comments  Do you awaken from a sound sleep coughing violently?                            With trouble breathing? no   Do you have choking episodes when you cannot  Get enough air, gasping for air ?              Sometimes like gagging but no vomit   Do you usually cough when you lie down into  The bed, or when you just lie down to rest ?                          no   Do you usually cough after meals or eating?         No    Do you cough when (or after) you bend over?    no   GERD SCORE     [name] v Neurogenic Cough Differentiator Neurogenic   Do you more-or-less cough all day long? Pretty constant   Does change of temperature make you cough? no   Does laughing or chuckling cause you to cough? yes   Do fumes (perfume, automobile fumes, burned  Toast, etc.,) cause you to cough ?      No    Does speaking, singing, or talking on the phone cause you to cough   ?               no   Neurogenic/Airway score      rec Stop loratadine and start zyrtec 10 mg one at bedtime instead  Pantoprazole (protonix) 40 mg   Take  30-60 min before first meal of the day and Pepcid (famotidine)  20  mg one after supper   until return to office - this is the best way to tell whether stomach acid is contributing to your problem. GERD diet  Please remember to go to the lab and x-ray department downstairs in the basement  for your tests - we will call you with the results when they are available. Please schedule a follow up office visit in 4 weeks, sooner if needed  with all medications /inhalers/ solutions in hand so we can verify exactly what you are taking. This includes all medications from all doctors and over the counters    12/20/2017  f/u ov/[name] re: uacs/ marked improvement with rx for gerd  Chief Complaint  Patient presents with  . Follow-up    Cough is  much improved. He states he has slight PND on the am's.   Dyspnea:  Not limited by breathing from desired activities   Cough: resolved, min am sense of pnds Sleeping: ok for about 3h then cpap comes off  "don't know why masks come off in sleep as no recollection of removing it"  SABA use: no 02: no   rec No change    07/05/2019  Consultation [name] re:     CT abd c/w 9 mm  RLL  Chief Complaint  Patient presents with  . Consult  Dyspnea:  Not limited by breathing from desired activities   Cough: day > noct, dry worse off gerd rx Sleeping: fine flat / 2 pillows  SABA use: none 02: none  rec Pantoprazole (protonix) 40 mg   Take  30-60 min before first meal of the day and Pepcid (famotidine)  20 mg after supper    GERD  Diet  We will call you to set up your follow up CT  prior to your next visit in 3 months    11/22/2019  f/u ov/[name] re:  Cough x 2017 ? p cabg/ spn  Chief Complaint  Patient presents with  . Follow-up    Cough is unchanged. He only took pantoprazole and pepcid for 1 month and then stopped.   Dyspnea:  No problem with doe  Cough: mostly dry day> night, note had mostly resolved 12/20/17 p rx for gerd and pnds  Sleeping:  Bed is flat, 2 pillows, does not wake up coughing  SABA use: none  02: none  Using  lots of mints daytime  rec  Pantoprazole (protonix) 40 mg   Take  30-60 min before first meal of the day and Pepcid (famotidine)  20 mg one @  bedtime until return to office - this is the best way to tell whether stomach acid is contributing to your problem.   Zyrtec 10 mg  In am and For drainage / throat tickle try take CHLORPHENIRAMINE  4 mg  (Chlortab 4mg   at [employer] should be easiest to find in the green box)  just  dose or two an hour before bedtime  GERD (REFLUX) For cough >  Delsym 2 tsp every 12 hours as needed  I will have our office contact in January 2022 for the follow up ct Please schedule a follow up office visit in 6 weeks, call sooner if needed with all medications /inhalers/ solutions in hand so we can verify exactly what you are taking. This includes all medications from all doctors and over the counters    01/01/2020  f/u ov/[name] re: cough  2017 better on reflux/ pnds / did not bring meds and confused with names  Chief Complaint  Patient presents with  . Follow-up    Cough is some better but not resolved yet. He occ produces some clear sputum. He has runny nose in the am.   Dyspnea:  Not limited by breathing from desired activities   Cough: mostly dry worse p supper but pt not aware of coughing noct (wife says he  Coughs some)  Sleeping: on side/ 2 pillows  No bed blocks SABA use: none  02: none ls or change in urine, dysuria, hematuria,  rash, arthralgias, visual complaints, headache, numbness, weakness or ataxia or problems with walking or coordination,  change in mood or  Memory. rec For drainage / throat tickle try take CHLORPHENIRAMINE  4 mg  (Chlortab 4mg   at [employer] should be  easiest to find in the green box)  take one every 4 hours as needed - available over the counter- may cause drowsiness so start with just a bedtime dose or two and see how you tolerate it before trying in daytime  Change pepcid (famotidine)  One twice daily after bfast and  after supper  If not improving start gabapentin 100 mg four times day build it up  - this should gradually improve your sensation of something stuck in your throat.   I very strongly recommend you get the moderna or pfizer vaccine   Please schedule a follow up office visit in 6 weeks, call sooner if needed with all medications Add: stop protonix (pantoprazole)       02/14/2020  f/u ov/Demarea Lorey re: cough x 2017  Chief Complaint  Patient presents with  . Follow-up    Cough is much improved "90% better"- non prod at this point. He never started the gabapentin.   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   sleeping without nocturnal  or early am exacerbation  of respiratory  c/o's or need for noct saba. Also denies any obvious fluctuation of symptoms with weather or environmental changes or other aggravating or alleviating factors except as outlined above   No unusual exposure hx or h/o childhood pna/ asthma or knowledge of premature birth.  Current Allergies, Complete Past Medical History, Past Surgical History, Family History, and Social History were reviewed in [facility] record.  ROS  The following are not active complaints unless bolded Hoarseness, sore throat, dysphagia, dental problems, itching, sneezing,  nasal congestion or discharge of excess mucus or purulent secretions, ear ache,   fever, chills, sweats, unintended wt loss or wt gain, classically pleuritic or exertional cp,  orthopnea pnd or arm/hand swelling  or leg swelling, presyncope, palpitations, abdominal pain, anorexia, nausea, vomiting, diarrhea  or change in bowel habits or change  in bladder habits, change in stools or change in urine, dysuria, hematuria,  rash, arthralgias, visual complaints, headache, numbness, weakness or ataxia or problems with walking or coordination,  change in mood or  Memory. tinnitis        Current Meds  Medication Sig  . aspirin 81 MG tablet Take 81 mg by mouth daily.  . cetirizine (ZYRTEC) 10 MG tablet Take 10 mg by mouth daily.  . chlorpheniramine (CHLOR-TRIMETON) 4 MG tablet Take 4 mg by mouth every 4 (four) hours as needed for allergies.  Marland Kitchen dicyclomine (BENTYL) 10 MG capsule Take 1 capsule (10 mg total) by mouth 4 (four) times daily -  before meals and at bedtime.  . famotidine (PEPCID) 20 MG tablet One after breakfast and after supper  . Homeopathic Products (ALLERGY MEDICINE PO) Take by mouth.  . metoprolol succinate (TOPROL-XL) 25 MG 24 hr tablet Take 1 tablet (25 mg total) by mouth daily.                     Objective:     02/14/2020     197    11/22/2019       198  07/05/2019  200 12/20/2017     200   11/21/17 205 lb (93 kg)  09/27/17 200 lb (90.7 kg)  08/31/17 200 lb (90.7 kg)    Vital signs reviewed  02/14/2020  - Note at rest 02 sats  95% on RA      amb pleasant wm nad, occ throat clearing    HEENT : pt wearing mask not removed for exam due to covid -19 concerns.    NECK :  without JVD/Nodes/TM/ nl carotid upstrokes bilaterally   LUNGS: no acc muscle use,  Nl contour chest which is clear to A and P bilaterally without cough on insp or exp maneuvers   CV:  RRR  no s3 or murmur or increase in P2, and no edema   ABD:  soft and nontender with nl inspiratory excursion in the supine position. No bruits or organomegaly appreciated, bowel sounds nl  MS:  Nl gait/ ext warm without deformities, calf tenderness, cyanosis or clubbing No obvious joint restrictions   SKIN: warm and dry without lesions    NEURO:  alert, approp, nl sensorium with  no motor or cerebellar deficits apparent.     .            Assessment

## 2020-02-14 NOTE — Assessment & Plan Note (Signed)
Never smoker - incidental finding on CT abd 06/17/19  10 x 8 mm RLL  - CT 10/02/2019  1. 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   nodule remains indeterminate.  - f/u CT 04/03/20 placed in reminder file    Discussed in detail all the  indications, usual  risks and alternatives  relative to the benefits with patient who agrees to proceed with w/u as outlined.            Each maintenance medication was reviewed in detail including emphasizing most importantly the difference between maintenance and prns and under what circumstances the prns are to be triggered using an action plan format where appropriate.  Total time for H and P, chart review, counseling,  and generating customized AVS unique to this office visit / charting = 28 min

## 2020-02-14 NOTE — Patient Instructions (Addendum)
No change in medications   If you want to try the medication for the throat clearing please call for trial  = gabapentin     I very strongly recommend you get the moderna or pfizer vaccine as soon as possible based on your risk of dying from the virus  and the proven safety and benefit of these vaccines against even the delta variant.  This can save your life as well as  those of your loved ones,  especially if they are also not vaccinated.     We will call you to schedule a follow up ct in January 2022    Tinnitus is very common problem typically evaluated by ENT  Pulmonary follow up is as needed

## 2020-03-20 ENCOUNTER — Other Ambulatory Visit: Payer: Self-pay | Admitting: Internal Medicine

## 2020-03-20 DIAGNOSIS — R911 Solitary pulmonary nodule: Secondary | ICD-10-CM

## 2020-03-24 ENCOUNTER — Other Ambulatory Visit: Payer: Self-pay | Admitting: Internal Medicine

## 2020-03-24 ENCOUNTER — Inpatient Hospital Stay: Admission: RE | Admit: 2020-03-24 | Payer: Medicare HMO | Source: Ambulatory Visit

## 2020-03-24 MED ORDER — DICYCLOMINE HCL 10 MG PO CAPS: 10.0000 mg | ORAL_CAPSULE | Freq: Three times a day (TID) | ORAL | 2 refills | Status: DC

## 2020-03-27 ENCOUNTER — Inpatient Hospital Stay: Admission: RE | Admit: 2020-03-27 | Payer: Medicare HMO | Source: Ambulatory Visit

## 2020-04-02 ENCOUNTER — Other Ambulatory Visit: Payer: Medicare HMO

## 2020-04-07 ENCOUNTER — Other Ambulatory Visit: Payer: Self-pay | Admitting: Internal Medicine

## 2020-05-15 ENCOUNTER — Encounter (INDEPENDENT_AMBULATORY_CARE_PROVIDER_SITE_OTHER): Payer: Self-pay

## 2020-05-15 ENCOUNTER — Ambulatory Visit (INDEPENDENT_AMBULATORY_CARE_PROVIDER_SITE_OTHER)
Admission: RE | Admit: 2020-05-15 | Discharge: 2020-05-15 | Disposition: A | Discharge status: Home / Self Care | Payer: Medicare HMO | Source: Ambulatory Visit | Attending: Internal Medicine | Admitting: Internal Medicine

## 2020-05-15 ENCOUNTER — Other Ambulatory Visit: Payer: Self-pay

## 2020-05-15 DIAGNOSIS — R911 Solitary pulmonary nodule: Secondary | ICD-10-CM | POA: Diagnosis not present

## 2020-05-18 NOTE — Progress Notes (Signed)
Spoke with pt and notified of results per Dr. Wert. Pt verbalized understanding and denied any questions. 

## 2020-06-23 ENCOUNTER — Telehealth: Payer: Self-pay | Admitting: Cardiology

## 2020-06-23 NOTE — Telephone Encounter (Signed)
Patient c/o Palpitations:  High priority if patient c/o lightheadedness, shortness of breath, or chest pain  1) How long have you had palpitations/irregular HR/ Afib? Are you having the symptoms now? Has not noticed any symptoms  2) Are you currently experiencing lightheadedness, SOB or CP? no  3) Do you have a history of afib (atrial fibrillation) or irregular heart rhythm? no  4) Have you checked your BP or HR? (document readings if available): states PCP said it was fine  5) Are you experiencing any other symptoms? no  Patient states he saw his PCP today and they said he had an irregular heart rhythm. He states he cannot sleep on his left side, because he can hear his heart beat in his ear. He states he is not having any other symptoms.

## 2020-06-23 NOTE — Telephone Encounter (Signed)
Called patient. He states that he seen his PCP today with [facility] (note in Epic) they did not complete an EKG but states he has an irregular heart beat and palpitations at night- Patient is not having any symptoms at this time (denies chest pain, shortness of breath, and swelling). He states he is feeling fine, but just would like Dr.Hochrein to review the notes- see if he should be seen sooner due to his cardiac history. I did advise patient his appointment is 05/10, but I would ask if it was needed to be sooner after review of the notes from [place].  Patient was advised to monitor his HR at home to see what this was as well.   Patient will await for response from MD.   Patient thankful for call back.

## 2020-06-24 NOTE — Telephone Encounter (Signed)
PT is calling back in regards to the current issue from yesterday.He states that he was told he would get a call back but still has not received one in regards to this matter. Please advise

## 2020-06-24 NOTE — Telephone Encounter (Signed)
Left message for patient that dr hochrein is working in the hospital and has not had a chance to respond. Patient is to call back if he is having problems.

## 2020-06-25 NOTE — Telephone Encounter (Signed)
I don't see any high risk events noted by the primary MD and He can follow as planned in early May.  Thanks.

## 2020-06-25 NOTE — Telephone Encounter (Signed)
Left message for patient of dr hochrein's remarks.

## 2020-07-12 NOTE — Progress Notes (Signed)
Cardiology Office Note   Date:  07/14/2020   ID:  [name], DOB 1950-12-12, MRN [id number]  PCP:  Doyle Askew, PA-C  Cardiologist:   Minus Breeding, MD    Chief Complaint  Patient presents with  . Irregular Heart Beat      History of Present Illness: [name] is a 70 y.o. male who presents for for follow up of lower extremity edema and MR.   Since I last saw him provider and I did review these notes.  She said that he had an irregular heartbeat.  She referred him here.  He did not have an EKG.  He was not feeling this. The patient denies any new symptoms such as chest discomfort, neck or arm discomfort. There has been no new shortness of breath, PND or orthopnea. There have been no reported palpitations, presyncope or syncope.  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  The patient denies any new symptoms such as chest discomfort, neck or arm discomfort. There has been no new shortness of breath, PND or orthopnea. There have been no reported palpitations, presyncope or syncope.  He was to see me in a year but he was referred early for evaluation of these palpitations.   Past Medical History:  Diagnosis Date  . Anxiety   . Atrial fibrillation ([place])    postoperative  . BPH (benign prostatic hypertrophy)   . Chronic otitis media    left, with chronic TM perforation; also with bilateral conductive hearing loss (sees Dr. [name] at [facility])  . CKD (chronic kidney disease), stage II 2015   Borderline stage II/III: CrCl@60  ml/min  . Gout   . History of non anemic vitamin B12 deficiency 2012  . Hypogonadism male 08/2010   Axiron started--?compliance? (repeat value 01/2011 mildly improved).  . Mitral regurgitation 04/30/2012  . Mitral Valve Prolapse with Severe Mitral Regurgitation    a. 01/2006 s/p MV Repair w/ 30 mm Edwards physio ring annuloplasty;  b. 06/2013 Echo: Ef 60-65%, no rwma,  triv AI, dilated Ao root (55mm), mild to mod MS, mild MR, mildly dil LA.  . Obesity   . Sleep apnea 07/04/2016  . Urge incontinence   . Viral URI 02/18/2014    Past Surgical History:  Procedure Laterality Date  . COLONOSCOPY  2003   Normal per pt: Dr. Anthony Sar in [place]  . [place]   doing fine since  . MITRAL VALVE REPAIR  2007   Dr. [name]  . TRANSTHORACIC ECHOCARDIOGRAM  06/2012; 06/2013   moderate MR (MV repair)--stable compared to prior echo and pt is asymptomatic.  Repeat echo 06/2013: Stable MV repair.  Mild regurg.  Mild AO root dilatation. No change in therapy.     Current Outpatient Medications  Medication Sig Dispense Refill  . allopurinol (ZYLOPRIM) 100 MG tablet Take 2 tablets by mouth daily.    Marland Kitchen aspirin 81 MG tablet Take 81 mg by mouth daily.    . cetirizine (ZYRTEC) 10 MG tablet Take 10 mg by mouth daily.    . chlorpheniramine (CHLOR-TRIMETON) 4 MG tablet Take 4 mg by mouth every 4 (four) hours as needed for allergies.    . Colchicine (MITIGARE) 0.6 MG CAPS Take 0.6 mg by mouth daily.    Marland Kitchen dicyclomine (BENTYL) 10 MG capsule Take 1 capsule (10 mg total) by mouth 4 (four) times daily -  before meals and at bedtime. 60 capsule 2  . famotidine (PEPCID) 20 MG tablet One after breakfast and after supper 60 tablet 11  . gabapentin (NEURONTIN) 100 MG capsule Take 1 capsule (100 mg total) by mouth 4 (four) times daily. One three times daily 120 capsule 2  . Homeopathic Products (ALLERGY MEDICINE PO) Take by mouth.    . levothyroxine (SYNTHROID) 25 MCG tablet TAKE 1 TABLET DAILY EVERY MORNING ON AND EMPTY STOMACH 30 TO 60 MIN BEFORE FOOD    . metoprolol succinate (TOPROL-XL) 25 MG 24 hr tablet Take 1 tablet (25 mg total) by mouth daily. 90 tablet 3  . pantoprazole (PROTONIX) 40 MG tablet      No current facility-administered medications for this visit.    Allergies:   Ivp dye [iodinated diagnostic agents] and Prednisone     ROS:  Please see the history of  present illness.   Otherwise, review of systems are positive for NONE.   All other systems are reviewed and negative.    PHYSICAL EXAM: VS:  BP 120/68   Pulse 81   Ht 5\' 6"  (1.676 m)   Wt 202 lb (91.6 kg)   SpO2 95%   BMI 32.60 kg/m  , BMI Body mass index is 32.6 kg/m.  GENERAL:  Well appearing NECK:  No jugular venous distention, waveform within normal limits, carotid upstroke brisk and symmetric, no bruits, no thyromegaly LUNGS:  Clear to auscultation bilaterally CHEST:  Unremarkable HEART:  PMI not displaced or sustained,S1 and S2 within normal limits, no S3, no S4, no clicks, no rubs, noe murmurs ABD:  Flat, positive bowel sounds normal in frequency in pitch, no bruits, no rebound, no guarding, no midline pulsatile mass, no hepatomegaly, no splenomegaly EXT:  2 plus pulses throughout, no edema, no cyanosis no clubbing  EKG:  EKG is  ordered today. Sinus rhythm, rate 81, axis within normal limits, intervals within normal limits, incomplete right bundle branch block, leftward axis, nonspecific anterior T wave changes.  Recent Labs: No results found for requested labs within last 8760 hours.    Lipid Panel    Component Value Date/Time   CHOL 224 (H) 06/01/2012 0834   TRIG 108.0 06/01/2012 0834   HDL 28.10 (L) 06/01/2012 0834   CHOLHDL 8 06/01/2012 0834   VLDL 21.6 06/01/2012 0834   LDLDIRECT 180.9 06/01/2012 0834      Wt Readings from Last 3 Encounters:  07/14/20 202 lb (91.6 kg)  02/14/20 197 lb 3.2 oz (89.4 kg)  01/01/20 196 lb (88.9 kg)      Other studies Reviewed: Additional studies/ records that were reviewed today include: Primary care office records. Review of the above records demonstrates:  None   ASSESSMENT AND PLAN:   ARRHYTHMIA: This was noted by his primary provider but he does not feel this.  There was some past history of atrial fibrillation postop so I will check a 2-week event monitor.  However, in the absence of any arrhythmias and not  suspecting any other therapy will be needed.  MV REPAIR:    MV repair is stable in the fall.  No further imaging at this point.  SLEEP APNEA:  He could not tolerate CPAP.    Current medicines are reviewed at length with the patient today.  The patient does not have concerns regarding medicines.  The following changes have been made:  None  Labs/ tests ordered today include:   Orders Placed This Encounter  Procedures  . LONG TERM MONITOR (  3-14 DAYS)  . EKG 12-Lead     Disposition:   FU with me in 12 months.     Signed, Minus Breeding, MD  07/14/2020 5:25 PM    [facility]

## 2020-07-14 ENCOUNTER — Ambulatory Visit: Payer: Medicare HMO | Admitting: Cardiology

## 2020-07-14 ENCOUNTER — Encounter: Payer: Self-pay | Admitting: Cardiology

## 2020-07-14 ENCOUNTER — Ambulatory Visit (INDEPENDENT_AMBULATORY_CARE_PROVIDER_SITE_OTHER): Payer: Medicare HMO

## 2020-07-14 ENCOUNTER — Other Ambulatory Visit: Payer: Self-pay

## 2020-07-14 VITALS — BP 120/68 | HR 81 | Ht 66.0 in | Wt 202.0 lb

## 2020-07-14 DIAGNOSIS — R002 Palpitations: Secondary | ICD-10-CM

## 2020-07-14 DIAGNOSIS — Z9889 Other specified postprocedural states: Secondary | ICD-10-CM

## 2020-07-14 DIAGNOSIS — G4730 Sleep apnea, unspecified: Secondary | ICD-10-CM

## 2020-07-14 NOTE — Patient Instructions (Signed)
Testing/Procedures:  [facility]ryn [facility]ull[facility]- Long Term Monitor Instructions   Your physician has requested you wear a ZIO patch monitor for _14__ days.  This is a single patch monitor.   [employer] supplies one patch monitor per enrollment. Additional stickers are not available. Please do not apply patch if you will be having a Nuclear Stress Test, Echocardiogram, Cardiac CT, MRI, or Chest Xray during the period you would be wearing the monitor. The patch cannot be worn during these tests. You cannot remove and re-apply the ZIO XT patch monitor.  Your ZIO patch monitor will be sent Fed Ex from [employer] directly to your home address. It may take 3-5 days to receive your monitor after you have been enrolled.  Once you have received your monitor, please review the enclosed instructions. Your monitor has already been registered assigning a specific monitor serial # to you.  Billing and Patient Assistance Program Information   We have supplied [employer] with any of your insurance information on file for billing purposes. [employer] offers a sliding scale Patient Assistance Program for patients that do not have insurance, or whose insurance does not completely cover the cost of the ZIO monitor.   You must apply for the Patient Assistance Program to qualify for this discounted rate.     To apply, please call [employer] at [phone], select option 4, then select option 2, and ask to apply for Patient Assistance Program.  Theodore Demark will ask your household income, and how many people are in your household.  They will quote your out-of-pocket cost based on that information.  [employer] will also be able to set up a 27-month, interest-free payment plan if needed.  Applying the monitor   Shave hair from upper left chest.  Hold abrader disc by orange tab. Rub abrader in 40 strokes over the upper left chest as indicated in your monitor instructions.  Clean area with 4 enclosed alcohol pads. Let dry.  Apply patch as indicated  in monitor instructions. Patch will be placed under collarbone on left side of chest with arrow pointing upward.  Rub patch adhesive wings for 2 minutes. Remove white label marked "1". Remove the white label marked "2". Rub patch adhesive wings for 2 additional minutes.  While looking in a mirror, press and release button in center of patch. A small green light will flash 3-4 times. This will be your only indicator that the monitor has been turned on. ?  Do not shower for the first 24 hours. You may shower after the first 24 hours.  Press the button if you feel a symptom. You will hear a small click. Record Date, Time and Symptom in the Patient Logbook.  When you are ready to remove the patch, follow instructions on the last 2 pages of the Patient Logbook. Stick patch monitor onto the last page of Patient Logbook.  Place Patient Logbook in the blue and white box.  Use locking tab on box and tape box closed securely.  The blue and white box has prepaid postage on it. Please place it in the mailbox as soon as possible. Your physician should have your test results approximately 7 days after the monitor has been mailed back to [facility].  Call [place] at [phone] if you have questions regarding your ZIO XT patch monitor. Call them immediately if you see an orange light blinking on your monitor.  If your monitor falls off in less than 4 days, contact our Monitor department at [phone]. ?If your  monitor becomes loose or falls off after 4 days call [employer] at [phone] for suggestions on securing your monitor.?    Follow-Up: At [facility], you and your health needs are our priority.  As part of our continuing mission to provide you with exceptional heart care, we have created designated Provider Care Teams.  These Care Teams include your primary Cardiologist (physician) and Advanced Practice Providers (APPs -  Physician Assistants and Nurse Practitioners) who all work  together to provide you with the care you need, when you need it.  We recommend signing up for the patient portal called "MyChart".  Sign up information is provided on this After Visit Summary.  MyChart is used to connect with patients for Virtual Visits (Telemedicine).  Patients are able to view lab/test results, encounter notes, upcoming appointments, etc.  Non-urgent messages can be sent to your provider as well.   To learn more about what you can do with MyChart, go to [email or web address].    Your next appointment:   12 month(s)  The format for your next appointment:   In Person  Provider:   Minus Breeding, MD

## 2020-07-18 ENCOUNTER — Other Ambulatory Visit: Payer: Self-pay | Admitting: Internal Medicine

## 2020-07-18 DIAGNOSIS — R002 Palpitations: Secondary | ICD-10-CM

## 2020-07-20 ENCOUNTER — Telehealth: Payer: Self-pay | Admitting: Cardiology

## 2020-07-20 NOTE — Telephone Encounter (Signed)
[name] is calling stating his wife placed his heart monitor on him this Saturday on 07/18/20. He states he feels it may be too far to the left of his chest and is wanting a nurse to check if the monitor is responding correctly due to this. Please advise.

## 2020-07-20 NOTE — Telephone Encounter (Signed)
Spoke with pt's wife (ok per DPR) regarding zio monitor. Pt is concerned about the location of the monitor and wanted [place] to check to see if we could see monitor readings. Explained to wife that the monitor that pt currently has is not a live monitor and we are unable to see anything readings at this time. Pt will continue to wear monitor and send it back after wearing for 14 days.  Wife verbalizes understanding.

## 2020-08-18 ENCOUNTER — Other Ambulatory Visit: Payer: Self-pay

## 2020-08-18 MED ORDER — DICYCLOMINE HCL 10 MG PO CAPS: 10.0000 mg | ORAL_CAPSULE | Freq: Three times a day (TID) | ORAL | 2 refills | Status: DC

## 2020-09-05 ENCOUNTER — Other Ambulatory Visit: Payer: Self-pay | Admitting: Internal Medicine

## 2020-12-13 ENCOUNTER — Other Ambulatory Visit: Payer: Self-pay | Admitting: Internal Medicine

## 2020-12-25 ENCOUNTER — Encounter: Payer: Self-pay | Admitting: Gastroenterology

## 2020-12-25 ENCOUNTER — Telehealth: Payer: Self-pay | Admitting: *Deleted

## 2020-12-25 ENCOUNTER — Ambulatory Visit: Payer: Medicare HMO | Admitting: Gastroenterology

## 2020-12-25 VITALS — BP 120/78 | HR 68 | Ht 66.0 in | Wt 201.4 lb

## 2020-12-25 DIAGNOSIS — R053 Chronic cough: Secondary | ICD-10-CM | POA: Diagnosis not present

## 2020-12-25 DIAGNOSIS — Z86010 Personal history of colonic polyps: Secondary | ICD-10-CM | POA: Diagnosis not present

## 2020-12-25 NOTE — Telephone Encounter (Signed)
   [name] 1950-05-11 [id number]  Dear Dr Melvyn Novas:  We have scheduled the above named patient for a(n) endoscopy/colonoscopy procedure, however, the patient presented to our office today exhibiting wheezing and cough. He has been scheduled for an appointment with you on 01/01/21.  Please advise as to whether the patient can be cleared from a pulmonary standpoint for his procedure which is scheduled for 01/18/21.  Please route your response to [place] Bo[place], [place] or fax response to [phone].  Sincerely,    [facility]

## 2020-12-25 NOTE — Patient Instructions (Addendum)
You have been scheduled for an endoscopy and colonoscopy. Please follow the written instructions given to you at your visit today. Please pick up your prep supplies at the pharmacy within the next 1-3 days. If you use inhalers (even only as needed), please bring them with you on the day of your procedure. ________________________________________________________  You have been scheduled to see Dr Melvyn Novas on 01/01/21 at 4:30 pm.  ________________________________________________________  If you are age 87 or older, your body mass index should be between 23-30. Your Body mass index is 32.51 kg/m. If this is out of the aforementioned range listed, please consider follow up with your Primary Care Provider.  If you are age 23 or younger, your body mass index should be between 19-25. Your Body mass index is 32.51 kg/m. If this is out of the aformentioned range listed, please consider follow up with your Primary Care Provider.   ________________________________________________________  The [facility] providers would like to encourage you to use [facility] to communicate with providers for non-urgent requests or questions.  Due to long hold times on the telephone, sending your provider a message by [facility] may be a faster and more efficient way to get a response.  Please allow 48 business hours for a response.  Please remember that this is for non-urgent requests.  _______________________________________________________ Due to recent changes in healthcare laws, you may see the results of your imaging and laboratory studies on MyChart before your provider has had a chance to review them.  We understand that in some cases there may be results that are confusing or concerning to you. Not all laboratory results come back in the same time frame and the provider may be waiting for multiple results in order to interpret others.  Please give [place] 48 hours in order for your provider to thoroughly review all the results  before contacting the office for clarification of your results.

## 2020-12-25 NOTE — Progress Notes (Signed)
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  EGD recommended for further evaluation.  However, I do not think dilation will help a chronic cough. Will obtain records from ENT to confirm their recent evaluation and findings.   Given his wheezing and that he not using any medications for treatment, I have asked him to follow-up with [name] prior to scheduling the procedure to maximize his pulmonary status prior to monitored anesthesia care.   History of colon polyp. Tubular adenoma on colonoscopy 2017. Outside gastroenterologist recommended surveillance in 5 years.   PLAN: - Consider increasing pantoprazole to 40 mg twice daily and Pepcid 20 mg twice daily -Reviewed GERD lifestyle modifications - Obtain records from [facility] in [place] - EGD and Colonoscopy - Plan endoscopic evaluation after pulmonary clearance given the wheezing  I spent over 30 minutes, including in depth chart review, independent review of results, communicating results with the patient directly, face-to-face time with the patient, coordinating care, and ordering studies and medications as appropriate, and documentation.    HPI: [name] is a 70 y.o. male who returns in follow-up. He is unaccompanied. He does not remember his appointment here in 06/2019 with Alonza Bogus for abdominal pain, bloating, and gas. Returns today for evaluation of reflux. He also thinks it's time for a surveillance colonoscopy.  He has a history of anxiety, atrial fibrillation, stage III chronic kidney disease, vitamin B12 deficiency, and prior mitral valve repair.  He has a history of obstructive sleep apnea but did not tolerate CPAP.  Recently seen by ENT for chronic cough x >1 year. I do not have access to those records. But, apparently the ENT recommended a sinus CT but  his wife was concerned about reflux and suggested that he needed an EGD. She feels he might need dilation to make the cough better.  No anorexia, unexplained weight loss, dysphagia, odynophagia, dysphonia, neck pain, sore throat, persistent vomiting, or gastrointestinal cancer in a first-degree relative.   Followed by [name] for chronic cough and shortness of breath.  Last seen 01/01/2020.  He is currently having wheezing at night - "every time I sit on the bed."  He takes no medications for his wheezing.    Does not feel that PPI therapy has helped in the past, but, the cough is worse if he stops his pantoprazole 40 mg QAM and famotidine 20 mg QHS.   He would like to reduce some of his medications. In particular, he asks about the allopurinol.  Prior abdominal imaging: CT of the abdomen and pelvis without contrast 06/17/2019 showed a right lower lobe pulmonary nodule.  There was a suspected kidney cyst.  Most recent CT chest without contrast to follow-up on the pulmonary nodule was performed 05/15/2020 showed a stable nodule.  Repeat imaging recommended in 2 years.  Prior endoscopic evaluation: EGD and colonoscopy with Dr. [name] 11/2015.  EGD was normal.  No biopsies were obtained.  Colonoscopy showed one 3 mm polyp in the distal sigmoid colon that was a tubular adenoma and grade 1 internal hemorrhoids.  Past Medical History:  Diagnosis Date   Anxiety    Atrial fibrillation ([facility])    postoperative   BPH (benign prostatic hypertrophy)    Chronic otitis media    left, with chronic TM perforation; also with bilateral conductive hearing loss (sees Dr.  [name] at [facility])   CKD (chronic kidney disease), stage II 2015   Borderline stage II/III: CrCl@60  ml/min   Gout    History of non anemic vitamin B12 deficiency 2012   Hypogonadism male 08/2010   Axiron started--?compliance? (repeat value 01/2011 mildly improved).   Mitral regurgitation 04/30/2012   Mitral Valve Prolapse with Severe Mitral  Regurgitation    a. 01/2006 s/p MV Repair w/ 30 mm Edwards physio ring annuloplasty;  b. 06/2013 Echo: Ef 60-65%, no rwma, triv AI, dilated Ao root (72mm), mild to mod MS, mild MR, mildly dil LA.   Obesity    Sleep apnea 07/04/2016   Urge incontinence    Viral URI 02/18/2014    Past Surgical History:  Procedure Laterality Date   COLONOSCOPY  2003   Normal per pt: Dr. Anthony Sar in [place]  2000   doing fine since   MITRAL VALVE REPAIR  2007   Dr. [name]   TRANSTHORACIC ECHOCARDIOGRAM  06/2012; 06/2013   moderate MR (MV repair)--stable compared to prior echo and pt is asymptomatic.  Repeat echo 06/2013: Stable MV repair.  Mild regurg.  Mild AO root dilatation. No change in therapy.     Current Outpatient Medications  Medication Sig Dispense Refill   allopurinol (ZYLOPRIM) 100 MG tablet Take 2 tablets by mouth daily.     aspirin 81 MG tablet Take 81 mg by mouth daily.     chlorpheniramine (CHLOR-TRIMETON) 4 MG tablet Take 4 mg by mouth every 4 (four) hours as needed for allergies.     Colchicine 0.6 MG CAPS Take 0.6 mg by mouth daily. As needed     dicyclomine (BENTYL) 10 MG capsule TAKE 1 CAPSULE (10 MG TOTAL) BY MOUTH 4 (FOUR) TIMES DAILY - BEFORE MEALS AND AT BEDTIME. 60 capsule 2   famotidine (PEPCID) 20 MG tablet One after breakfast and after supper 60 tablet 11   Homeopathic Products (ALLERGY MEDICINE PO) Take by mouth.     levothyroxine (SYNTHROID) 25 MCG tablet TAKE 1 TABLET DAILY EVERY MORNING ON AND EMPTY STOMACH 30 TO 60 MIN BEFORE FOOD     metoprolol succinate (TOPROL-XL) 25 MG 24 hr tablet Take 1 tablet (25 mg total) by mouth daily. 90 tablet 3   pantoprazole (PROTONIX) 40 MG tablet TAKE 1 TABLET (40 MG TOTAL) BY MOUTH DAILY. TAKE 30-60 MIN BEFORE FIRST MEAL OF THE DAY 90 tablet 1   No current facility-administered medications for this visit.    Allergies as of 12/25/2020 - Review Complete 12/25/2020  Allergen Reaction Noted   Ivp dye [iodinated diagnostic  agents] Anaphylaxis 07/03/2014   Prednisone Other (See Comments) 09/25/2014    Family History  Problem Relation Age of Onset   Alcohol abuse Mother    Alcohol abuse Father    Prostate cancer Father        prostate   Bladder Cancer Father    Throat cancer Maternal Uncle    Stomach cancer Neg Hx    Colon cancer Neg Hx    Esophageal cancer Neg Hx    Pancreatic cancer Neg Hx     Physical Exam: General:   Alert,  well-nourished, pleasant and cooperative in NAD Head:  Normocephalic and atraumatic. Eyes:  Sclera clear, no icterus.   Conjunctiva pink. Ears:  Normal auditory acuity. Nose:  No deformity, discharge,  or lesions. Mouth:  No deformity or lesions.   Neck:  Supple; no masses or thyromegaly. Lungs:  Clear throughout to auscultation.  Some expiratory wheezing. Heart:  Regular rate and rhythm; no murmurs. Abdomen:  Soft,nontender, nondistended, normal bowel sounds, no rebound or guarding. No hepatosplenomegaly.   Rectal:  Deferred  Msk:  Symmetrical. No boney deformities LAD: No inguinal or umbilical LAD Extremities:  No clubbing or edema. Neurologic:  Alert and  oriented x4;  grossly nonfocal Skin:  Intact without significant lesions or rashes. Psych:  Alert and cooperative. Normal mood and affect.     Dene Landsberg L. Tarri Glenn, MD, MPH 12/25/2020, 1:34 PM

## 2020-12-26 ENCOUNTER — Other Ambulatory Visit: Payer: Self-pay | Admitting: Internal Medicine

## 2020-12-29 NOTE — Telephone Encounter (Signed)
Please advise if you are okay with us refilling med. 

## 2021-01-01 ENCOUNTER — Other Ambulatory Visit: Payer: Self-pay

## 2021-01-01 ENCOUNTER — Ambulatory Visit: Payer: Medicare HMO | Admitting: Internal Medicine

## 2021-01-01 ENCOUNTER — Encounter: Payer: Self-pay | Admitting: Internal Medicine

## 2021-01-01 VITALS — BP 126/76 | HR 78 | Temp 98.2°F | Ht 66.0 in | Wt 201.4 lb

## 2021-01-01 DIAGNOSIS — R911 Solitary pulmonary nodule: Secondary | ICD-10-CM

## 2021-01-01 DIAGNOSIS — R058 Other specified cough: Secondary | ICD-10-CM

## 2021-01-01 MED ORDER — FAMOTIDINE 20 MG PO TABS: ORAL_TABLET | ORAL | 11 refills | Status: DC

## 2021-01-01 MED ORDER — TRAMADOL HCL 50 MG PO TABS: 50.0000 mg | ORAL_TABLET | ORAL | 0 refills | Status: DC | PRN

## 2021-01-01 MED ORDER — PANTOPRAZOLE SODIUM 40 MG PO TBEC: DELAYED_RELEASE_TABLET | ORAL | 1 refills | Status: DC

## 2021-01-01 MED ORDER — METHYLPREDNISOLONE ACETATE 80 MG/ML IJ SUSP
120.0000 mg | Freq: Once | INTRAMUSCULAR | Status: AC
  Administered 2021-01-01: 120 mg via INTRAMUSCULAR

## 2021-01-01 NOTE — Progress Notes (Signed)
[name], male    DOB: 25-Jan-1951,   MRN: [id number]     Brief patient profile:  31 yowm never smoker from [place] perfectly healthy until new cough around fall 2017 - symptoms daily then sob/ wheeze 2018 no better with inhalers or steroids  So self-referred 11/21/2017     History of Present Illness  11/21/2017  Pulmonary / 1st office eval  Chief Complaint  Patient presents with   Pulmonary Consult    Self referral.  Pt c/o cough for the past 2 yrs, worse with wheezing over the past 2 months. He occ will produce some clear sputum.   Dyspnea:  Not limited by breathing from desired activities   Cough: see below  Sleep: cough better when sleeping on 2 pillows / cpap started 11/10/17  SABA use: no better Some am excess mucus runny out of nose   [name] v Neurogenic Cough Differentiator Reflux Comments  Do you awaken from a sound sleep coughing violently?                            With trouble breathing? no   Do you have choking episodes when you cannot  Get enough air, gasping for air ?              Sometimes like gagging but no vomit   Do you usually cough when you lie down into  The bed, or when you just lie down to rest ?                          no   Do you usually cough after meals or eating?         No    Do you cough when (or after) you bend over?    no   GERD SCORE     [name] v Neurogenic Cough Differentiator Neurogenic   Do you more-or-less cough all day long? Pretty constant   Does change of temperature make you cough? no   Does laughing or chuckling cause you to cough? yes   Do fumes (perfume, automobile fumes, burned  Toast, etc.,) cause you to cough ?      No    Does speaking, singing, or talking on the phone cause you to cough   ?               no   Neurogenic/Airway score      rec Stop loratadine and start zyrtec 10 mg one at bedtime instead  Pantoprazole (protonix) 40 mg   Take  30-60 min before first meal of the day and Pepcid (famotidine)  20 mg  one after supper   until return to office - this is the best way to tell whether stomach acid is contributing to your problem. GERD diet  Please schedule a follow up office visit in 4 weeks, sooner if needed  with all medications /inhalers/ solutions in hand so we can verify exactly what you are taking. This includes all medications from all doctors and over the counters    12/20/2017  f/u ov/[name] re: uacs/ marked improvement with rx for gerd  Chief Complaint  Patient presents with   Follow-up    Cough is much improved. He states he has slight PND on the am's.   Dyspnea:  Not limited by breathing from desired activities   Cough: resolved, min am sense of pnds  Sleeping: ok for about 3h then cpap comes off  "don't know why masks come off in sleep as no recollection of removing it"  SABA use: no 02: no   rec No change   11/22/2019  f/u ov/[name] re:  Cough x 2017 ? p cabg/ spn  Chief Complaint  Patient presents with   Follow-up    Cough is unchanged. He only took pantoprazole and pepcid for 1 month and then stopped.   Dyspnea:  No problem with doe  Cough: mostly dry day> night, note had mostly resolved 12/20/17 p rx for gerd and pnds  Sleeping:  Bed is flat, 2 pillows, does not wake up coughing  SABA use: none  02: none  Using lots of mints daytime  rec  Pantoprazole (protonix) 40 mg   Take  30-60 min before first meal of the day and Pepcid (famotidine)  20 mg one @  bedtime until return to office - this is the best way to tell whether stomach acid is contributing to your problem.   Zyrtec 10 mg  In am and For drainage / throat tickle try take CHLORPHENIRAMINE  4 mg  (Chlortab 4mg   at [employer] [facility] should be easiest to find in the green box)  just  dose or two an hour before bedtime  GERD (REFLUX) For cough >  Delsym 2 tsp every 12 hours as needed  I will have our office contact in January 2022 for the follow up ct Please schedule a follow up office visit in 6 weeks, call sooner if  needed with all medications /inhalers/ solutions in hand so we can verify exactly what you are taking. This includes all medications from all doctors and over the counters    01/01/2020  f/u ov/[name] re: cough  2017 better on reflux/ pnds / did not bring meds and confused with names  Chief Complaint  Patient presents with   Follow-up    Cough is some better but not resolved yet. He occ produces some clear sputum. He has runny nose in the am.   Dyspnea:  Not limited by breathing from desired activities   Cough: mostly dry worse p supper but pt not aware of coughing noct (wife says he  Coughs some)  Sleeping: on side/ 2 pillows  No bed blocks SABA use: none  02: none ls or change in urine, dysuria, hematuria,  rash, arthralgias, visual complaints, headache, numbness, weakness or ataxia or problems with walking or coordination,  change in mood or  Memory. rec For drainage / throat tickle try take CHLORPHENIRAMINE  4 mg  (Chlortab 4mg   at [employer] [facility] should be easiest to find in the green box)  take one every 4 hours as needed - available over the counter- may cause drowsiness so start with just a bedtime dose or two and see how you tolerate it before trying in daytime  Change pepcid (famotidine)  One twice daily after bfast and after supper  If not improving start gabapentin 100 mg four times day build it up  - this should gradually improve your sensation of something stuck in your throat.   I very strongly recommend you get the moderna or pfizer vaccine   Please schedule a follow up office visit in 6 weeks, call sooner if needed with all medications Add: stop protonix (pantoprazole)       02/14/2020  f/u ov/[name] re: cough x 2017  Chief Complaint  Patient presents with   Follow-up    Cough  is much improved "90% better"- non prod at this point. He never started the gabapentin.   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  He is also asking for pulmonary clearance for colonoscopy and endoscopy- Dr Tarri Glenn.   Dyspnea:  retired from [employer], [employer] still doing yardwork ok / walks/ steps ok  Cough: seems nose starts running w/in an hour of rising and then starts coughing/ no longer on h1  Sleeping: cough absent during sleep bed flat 2 pillows /  SABA use: no  02: none  Covid status:   vax x never  / never infected    No obvious day to day or daytime variability or assoc excess/ purulent sputum or mucus plugs or hemoptysis or cp or chest tightness, subjective wheeze or overt sinus or hb symptoms.   Sleeping  without nocturnal  or early am exacerbation  of respiratory  c/o's or need for noct saba. Also denies any obvious fluctuation of symptoms with weather or environmental changes or other aggravating or alleviating factors except as outlined above   No unusual exposure hx or h/o childhood pna/ asthma or knowledge of premature birth.  Current Allergies, Complete Past Medical History, Past Surgical History, Family History, and Social History were reviewed in [facility] record.  ROS  The following are not active complaints unless bolded Hoarseness, sore throat, dysphagia, dental problems, itching, sneezing,  nasal congestion or discharge of excess mucus or purulent secretions, ear ache,   fever, chills, sweats, unintended wt loss or wt gain, classically pleuritic or exertional cp,  orthopnea pnd or arm/hand swelling  or leg  swelling, presyncope, palpitations, abdominal pain, anorexia, nausea, vomiting, diarrhea  or change in bowel habits or change in bladder habits, change in stools or change in urine, dysuria, hematuria,  rash, arthralgias, visual complaints, headache, numbness, weakness or ataxia or problems with walking or coordination,  change in mood or  memory.        Current Meds  Medication Sig   allopurinol (ZYLOPRIM) 100 MG tablet Take 2 tablets by mouth daily.   aspirin 81 MG tablet Take 81 mg by mouth daily.   chlorpheniramine (CHLOR-TRIMETON) 4 MG tablet Take 4 mg by mouth every 4 (four) hours as needed for allergies.   Colchicine 0.6 MG CAPS Take 0.6 mg by mouth daily. As needed   dicyclomine (BENTYL) 10 MG capsule TAKE 1 CAPSULE (10 MG TOTAL) BY MOUTH 4 (FOUR) TIMES DAILY - BEFORE MEALS AND AT BEDTIME. (Patient taking differently: Take 10 mg by mouth 4 (four) times daily -  before meals and at bedtime. Pt states only takes once per day)   levothyroxine (SYNTHROID) 25 MCG tablet TAKE 1 TABLET DAILY EVERY MORNING ON AND EMPTY STOMACH 30 TO 60 MIN BEFORE FOOD   metoprolol succinate (TOPROL-XL) 25 MG 24 hr tablet Take 1  tablet (25 mg total) by mouth daily.   pantoprazole (PROTONIX) 40 MG tablet TAKE 1 TABLET (40 MG TOTAL) BY MOUTH DAILY. TAKE 30-60 MIN BEFORE FIRST MEAL OF THE DAY                Objective:    01/01/2021     201  02/14/2020     197    11/22/2019       198  07/05/2019       200 12/20/2017     200   11/21/17 205 lb (93 kg)  09/27/17 200 lb (90.7 kg)  08/31/17 200 lb (90.7 kg)      Vital signs reviewed  01/01/2021  - Note at rest 02 sats  96% on RA   General appearance:    amb wm/ harsh dry hacking cough/ prominent pseudowheeze/ very shaky on details of care   HEENT : minimal cobblestoning of orophx, minimal watery pnd     NECK :  without JVD/Nodes/TM/ nl carotid upstrokes bilaterally   LUNGS: no acc muscle use,  Nl contour chest which is clear to A and P bilaterally  without cough on insp or exp maneuvers   CV:  RRR  no s3 or murmur or increase in P2, and no edema   ABD:  soft and nontender with nl inspiratory excursion in the supine position. No bruits or organomegaly appreciated, bowel sounds nl  MS:  Nl gait/ ext warm without deformities, calf tenderness, cyanosis or clubbing No obvious joint restrictions   SKIN: warm and dry without lesions    NEURO:  alert, approp, nl sensorium with  no motor or cerebellar deficits apparent.        I personally reviewed images and agree with radiology impression as follows:   Chest CT 05/15/20 s contrast 1. Similar right lower lobe pulmonary nodule of mean diameter 8 mm. Per consensus criteria, this should be considered for follow-up chest CT on or after June 16, 2021 in order to confirm 2 years of stability. This recommendation follows the consensus statement: Guidelines for Management of Incidental Pulmonary Nodules Detected on CT Images:From the Fleischner Society 2017; published online before print (10.1148/[id number]). 2.  No acute process in the chest.       Assessment

## 2021-01-01 NOTE — Telephone Encounter (Signed)
His cough is in terrible shape and I gave him an intense cough regimen today.  He does not have true wheeze   When he does have colonoscopy might think about endoscopy too for GERD  if doesn't respond to rx   Thx

## 2021-01-01 NOTE — Patient Instructions (Addendum)
The key to effective treatment for your cough is eliminating the non-stop cycle of cough you're stuck in long enough to let your airway heal completely and then see if there is anything still making you cough once you stop the cough suppression, but this should take no more than 5 days to figure out  First take delsym two tsp every 12 hours and supplement if needed with  tramadol 50 mg up to 2 every 4 hours to suppress the urge to cough at all or even clear your throat. Swallowing water or using ice chips/non mint and menthol containing candies (such as lifesavers or sugarless jolly ranchers) are also effective.  You should rest your voice and avoid activities that you know make you cough.  Once you have eliminated the cough for 3 straight days try reducing the tramadol first,  then the delsym as tolerated.    Depomerol 120 mg IM   Protonix (pantoprazole) Take 30-60 min before first meal of the day and Pepcid 20 mg one after supper plus chlorpheniramine 4 mg x 2 at bedtime (both available over the counter)  until cough is completely gone for at least a week without the need for cough suppression  GERD (REFLUX)  is an extremely common cause of respiratory symptoms, many times with no significant heartburn at all.    It can be treated with medication, but also with lifestyle changes including avoidance of late meals, excessive alcohol, smoking cessation, and avoid fatty foods, chocolate, peppermint, colas, red wine, and acidic juices such as orange juice.  NO MINT OR MENTHOL PRODUCTS SO NO COUGH DROPS  USE HARD CANDY INSTEAD (jolley ranchers or Stover's or Lifesavers (all available in sugarless versions) NO OIL BASED VITAMINS - use powdered substitutes.  For drainage / throat tickle try take CHLORPHENIRAMINE  4 mg should be easiest to find in the green box)  take one every 4 hours as needed - available over the counter- may cause drowsiness so start with just a dose or two an hour before bedtime and  see how you tolerate it before trying in daytime    Please schedule a follow up office visit in 4-6 weeks, call sooner if needed with all medications /inhalers/ solutions in hand so we can verify exactly what you are taking. This includes all medications from all doctors and over the counters

## 2021-01-02 ENCOUNTER — Encounter: Payer: Self-pay | Admitting: Internal Medicine

## 2021-01-02 NOTE — Assessment & Plan Note (Signed)
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  Of the three most common causes of  Sub-acute / recurrent or chronic cough, only one (GERD)  can actually contribute to/ trigger  the other two (asthma and post nasal drip syndrome)  and perpetuate the cylce of cough.  While not intuitively obvious, many patients with chronic low grade reflux do not cough until there is a primary insult that disturbs the protective epithelial barrier and exposes sensitive nerve endings.   This is typically viral but can due to [place] probably applies here.  >>>    The point is that once this occurs, it is difficult to eliminate the cycle  using anything but a maximally effective acid suppression regimen at least in the short run, accompanied by an appropriate diet to address non acid GERD and control / eliminate the cough itself for at least 3 days with tramadol and eliminate pnds with 1st gen H1 blockers per guidelines  p depomedrol 120 mg IM  in case of component of Th-2 driven upper or lower airways inflammation (if cough responds short term only to relapse before return while will on full rx for uacs (as above), then  that would point to allergic rhinitis/ asthma or eos bronchitis as alternative dx)   If not 100% better this time would rec re either see [facility] at [facility] or consider trial of gabapentin.  Reviewed: The standardized cough guidelines published in Chest by Lissa Morales in 2006 are still the best available  and consist of a multiple step process (up to 12!) , not a single office visit,  and are intended  to address this problem logically,  with an alogrithm dependent on response to empiric treatment at  each progressive step  to determine a specific diagnosis with  minimal addtional testing needed. Therefore if adherence is an issue or can't be accurately verified,  it's very unlikely the standard evaluation and treatment will be successful here.    Furthermore, response to therapy (other than acute cough suppression, which should only be used short term with avoidance of narcotic containing cough syrups if possible), can be a gradual process for which the patient is not likely to  perceive immediate benefit.  Unlike going to an eye doctor where the best perscription is almost always the first one and is immediately effective, this is almost never the case in the management of chronic cough syndromes. Therefore the patient needs to commit up front to consistently adhere to recommendations  for up to 6 weeks of therapy directed at the likely underlying problem(s) before the response can be reasonably evaluated.   Therefore f/u in 4-6 weeks with all meds in hand using a trust but verify approach to confirm accurate Medication  Reconciliation The principal here is that until we are certain that the  patients are doing what we've asked, it makes no sense to ask  them to do more.          Each maintenance medication was reviewed in detail including emphasizing most importantly the difference between maintenance and prns and under what circumstances the prns are to be triggered using an action plan format where appropriate.  Total time for H and P, chart review, counseling,  and generating customized AVS unique to this office visit / same day charting =  30 min

## 2021-01-02 NOTE — Assessment & Plan Note (Signed)
Never smoker - incidental finding on CT abd 06/17/19  10 x 8 mm RLL  - CT 10/02/2019  1. 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   nodule remains indeterminate.  - f/u CT 04/03/20 placed in reminder file    Discussed in detail all the  indications, usual  risks and alternatives  relative to the benefits with patient who agrees to proceed with w/u as outlined.

## 2021-01-04 NOTE — Telephone Encounter (Signed)
===  View-only below this line=== ----- Message ----- From: Thornton Park, MD Sent: 01/04/2021   8:42 AM EDT To: Larina Bras, CMA, Tanda Rockers, MD  Thanks for the input. We were planning EGD and colonoscopy. We will get him scheduled.   KLB

## 2021-01-04 NOTE — Telephone Encounter (Signed)
Patient has already been instructed and scheduled for endoscopy and colonoscopy on 01/18/21. I have also left him a message that we have received a note from Dr Melvyn Novas and will proceed with the previously scheduled appointment.

## 2021-01-08 ENCOUNTER — Telehealth: Payer: Self-pay | Admitting: *Deleted

## 2021-01-08 ENCOUNTER — Ambulatory Visit (AMBULATORY_SURGERY_CENTER): Payer: Medicare HMO | Admitting: *Deleted

## 2021-01-08 VITALS — Ht 66.0 in

## 2021-01-08 DIAGNOSIS — K219 Gastro-esophageal reflux disease without esophagitis: Secondary | ICD-10-CM

## 2021-01-08 DIAGNOSIS — Z86010 Personal history of colonic polyps: Secondary | ICD-10-CM

## 2021-01-08 NOTE — Telephone Encounter (Signed)
Patient no showed PV today- Called patient and left message to return call by 5 pm today- If no call by 5 pm, PV and procedure will be canceled - no call at 5 pm -  PV and Procedure both canceled- No Show letter mailed to patient  

## 2021-01-08 NOTE — Progress Notes (Signed)
Called pt 825 am- no answer- LM to return call   Called again 831 am- no answer- LM to return call- Called cell number listed- It rang a few times then fast busy signal, called work number- again, rang a few times and fast busy signal   Called again 840 am- no answer- LM to return call by 5 pm today to RS PV- If no call by 5 pm would cancel Procedures on 12-9- and he could CB and RS all-   Lelan Pons PV

## 2021-01-08 NOTE — Telephone Encounter (Signed)
Called pt 825 am- no answer- LM to return call    Called again 831 am- no answer- LM to return call- Called cell number listed- It rang a few times then fast busy signal, called work number- again, rang a few times and fast busy signal    Called again 840 am- no answer- LM to return call by 5 pm today to RS PV- If no call by 5 pm would cancel Procedures on 12-9- and he could CB and RS all-   Lelan Pons PV

## 2021-01-18 ENCOUNTER — Encounter: Payer: Medicare HMO | Admitting: Gastroenterology

## 2021-02-08 ENCOUNTER — Other Ambulatory Visit: Payer: Self-pay | Admitting: Cardiovascular Disease

## 2021-02-10 ENCOUNTER — Encounter: Payer: Self-pay | Admitting: Internal Medicine

## 2021-02-10 ENCOUNTER — Ambulatory Visit (INDEPENDENT_AMBULATORY_CARE_PROVIDER_SITE_OTHER): Payer: Medicare HMO | Admitting: Internal Medicine

## 2021-02-10 ENCOUNTER — Other Ambulatory Visit: Payer: Self-pay

## 2021-02-10 DIAGNOSIS — R1030 Lower abdominal pain, unspecified: Secondary | ICD-10-CM

## 2021-02-10 DIAGNOSIS — R058 Other specified cough: Secondary | ICD-10-CM

## 2021-02-10 NOTE — Patient Instructions (Signed)
Treatment consists of avoiding foods that cause gas (especially boiled eggs, [place] food but especially  beans and undercooked vegetables like  spinach and some salads) .   If you are satisfied with your treatment plan,  let your doctor know and he/she can either refill your medications or you can return here when your prescription runs out.     If in any way you are not 100% satisfied,  please tell us.  If 100% better, tell your friends!  Pulmonary follow up is as needed

## 2021-02-10 NOTE — Assessment & Plan Note (Signed)
Onset Fall 2017  FENO 11/21/2017  =   Machine malfunctioned - Allergy profile 11/21/2017 >  Eos 0.4 /  IgE  57 RAST neg  - marked improvement 12/20/2017 on gerd rx - worse 07/05/2019 off gerd rx  - 11/22/2019 restart gerd diet/ 1st gen H1 blockers per guidelines  / stop mints and use delsym otc > some better but still globus > rec trial of gabapentin but never took it - cyclical cough protocol 01/01/2021 >>> resolved as of 02/10/2021   No assoc change in bowel/ bladder habits or changes with eating/ flatulence or bm/urination > GI eval in progress and I have no further insight as to cause but hopefully just represents some form  Of IBS/ advised on diet pending GI eval          Each maintenance medication was reviewed in detail including emphasizing most importantly the difference between maintenance and prns and under what circumstances the prns are to be triggered using an action plan format where appropriate.  Total time for H and P, chart review, counseling, reviewing  and generating customized AVS unique to this office visit / same day charting = 25 min

## 2021-02-10 NOTE — Progress Notes (Signed)
[name], male    DOB: 1950-03-21    MRN: [id number]    Brief patient profile:  6 yowm never smoker from [place] perfectly healthy until new cough around fall 2017 - symptoms daily then sob/ wheeze 2018 no better with inhalers or steroids  So self-referred 11/21/2017     History of Present Illness  11/21/2017  Pulmonary / 1st office eval  Chief Complaint  Patient presents with   Pulmonary Consult    Self referral.  Pt c/o cough for the past 2 yrs, worse with wheezing over the past 2 months. He occ will produce some clear sputum.   Dyspnea:  Not limited by breathing from desired activities   Cough: see below  Sleep: cough better when sleeping on 2 pillows / cpap started 11/10/17  SABA use: no better Some am excess mucus runny out of nose   [name] v Neurogenic Cough Differentiator Reflux Comments  Do you awaken from a sound sleep coughing violently?                            With trouble breathing? no   Do you have choking episodes when you cannot  Get enough air, gasping for air ?              Sometimes like gagging but no vomit   Do you usually cough when you lie down into  The bed, or when you just lie down to rest ?                          no   Do you usually cough after meals or eating?         No    Do you cough when (or after) you bend over?    no   GERD SCORE     [name] v Neurogenic Cough Differentiator Neurogenic   Do you more-or-less cough all day long? Pretty constant   Does change of temperature make you cough? no   Does laughing or chuckling cause you to cough? yes   Do fumes (perfume, automobile fumes, burned  Toast, etc.,) cause you to cough ?      No    Does speaking, singing, or talking on the phone cause you to cough   ?               no   Neurogenic/Airway score      rec Stop loratadine and start zyrtec 10 mg one at bedtime instead  Pantoprazole (protonix) 40 mg   Take  30-60 min before first meal of the day and Pepcid (famotidine)  20 mg  one after supper   until return to office - this is the best way to tell whether stomach acid is contributing to your problem. GERD diet  Please schedule a follow up office visit in 4 weeks, sooner if needed  with all medications /inhalers/ solutions in hand so we can verify exactly what you are taking. This includes all medications from all doctors and over the counters    12/20/2017  f/u ov/Kimori Tartaglia re: uacs/ marked improvement with rx for gerd  Chief Complaint  Patient presents with   Follow-up    Cough is much improved. He states he has slight PND on the am's.   Dyspnea:  Not limited by breathing from desired activities   Cough: resolved, min am sense of pnds  Sleeping: ok for about 3h then cpap comes off  "don't know why masks come off in sleep as no recollection of removing it"  SABA use: no 02: no   rec No change   11/22/2019  f/u ov/Nakeita Styles re:  Cough x 2017 ? p cabg/ spn  Chief Complaint  Patient presents with   Follow-up    Cough is unchanged. He only took pantoprazole and pepcid for 1 month and then stopped.   Dyspnea:  No problem with doe  Cough: mostly dry day> night, note had mostly resolved 12/20/17 p rx for gerd and pnds  Sleeping:  Bed is flat, 2 pillows, does not wake up coughing  SABA use: none  02: none  Using lots of mints daytime  rec  Pantoprazole (protonix) 40 mg   Take  30-60 min before first meal of the day and Pepcid (famotidine)  20 mg one @  bedtime until return to office - this is the best way to tell whether stomach acid is contributing to your problem.   Zyrtec 10 mg  In am and For drainage / throat tickle try take CHLORPHENIRAMINE  4 mg  (Chlortab 4mg   at [employer] should be easiest to find in the green box)  just  dose or two an hour before bedtime  GERD (REFLUX) For cough >  Delsym 2 tsp every 12 hours as needed  I will have our office contact in January 2022 for the follow up ct Please schedule a follow up office visit in 6 weeks, call sooner if  needed with all medications /inhalers/ solutions in hand so we can verify exactly what you are taking. This includes all medications from all doctors and over the counters    01/01/2020  f/u ov/Colt Martelle re: cough  2017 better on reflux/ pnds / did not bring meds and confused with names  Chief Complaint  Patient presents with   Follow-up    Cough is some better but not resolved yet. He occ produces some clear sputum. He has runny nose in the am.   Dyspnea:  Not limited by breathing from desired activities   Cough: mostly dry worse p supper but pt not aware of coughing noct (wife says he  Coughs some)  Sleeping: on side/ 2 pillows  No bed blocks SABA use: none  02: none ls or change in urine, dysuria, hematuria,  rash, arthralgias, visual complaints, headache, numbness, weakness or ataxia or problems with walking or coordination,  change in mood or  Memory. rec For drainage / throat tickle try take CHLORPHENIRAMINE  4 mg  (Chlortab 4mg   at [employer] should be easiest to find in the green box)  take one every 4 hours as needed - available over the counter- may cause drowsiness so start with just a bedtime dose or two and see how you tolerate it before trying in daytime  Change pepcid (famotidine)  One twice daily after bfast and after supper  If not improving start gabapentin 100 mg four times day build it up  - this should gradually improve your sensation of something stuck in your throat.   I very strongly recommend you get the moderna or pfizer vaccine   Please schedule a follow up office visit in 6 weeks, call sooner if needed with all medications Add: stop protonix (pantoprazole)       02/14/2020  f/u ov/Nadene Witherspoon re: cough x 2017  Chief Complaint  Patient presents with   Follow-up    Cough  is much improved "90% better"- non prod at this point. He never started the gabapentin.   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  Cough: 90% gone / min throat  clearing Sleeping: sleeps on R side bed is flat, 2 pillows  SABA use: none 02: none  Rec No change in medications  If you want to try the medication for the throat clearing please call for trial  = gabapentin      02/10/2021  f/u ov/Evora Schechter re: cough since 2017  maint on gerd rx   Chief Complaint  Patient presents with   Follow-up   Dyspnea:  none  Cough: none now  Sleeping: flat bed/ 2 pillows  SABA use: none  02: none  Covid status:   never vax/ never infected  New lower abd pain  waxes and wanes x one month can resolve for days for up to a week Never wakes up with pain    No obvious day to day or daytime variability or assoc excess/ purulent sputum or mucus plugs or hemoptysis or cp or chest tightness, subjective wheeze or overt sinus or hb symptoms.   Sleeping  without nocturnal  or early am exacerbation  of respiratory  c/o's or need for noct saba. Also denies any obvious fluctuation of symptoms with weather or environmental changes or other aggravating or alleviating factors except as outlined above   No unusual exposure hx or h/o childhood pna/ asthma or knowledge of premature birth.  Current Allergies, Complete Past Medical History, Past Surgical History, Family History, and Social History were reviewed in [employer] record.  ROS  The following are not active complaints unless bolded Hoarseness, sore throat, dysphagia, dental problems, itching, sneezing,  nasal congestion or discharge of excess mucus or purulent secretions, ear ache,   fever, chills, sweats, unintended wt loss or wt gain, classically pleuritic or exertional cp,  orthopnea pnd or arm/hand swelling  or leg swelling, presyncope, palpitations, abdominal pain, anorexia, nausea, vomiting, diarrhea  or change in bowel habits or change in bladder habits, change in stools or change in urine, dysuria, hematuria,  rash, arthralgias, visual complaints, headache, numbness, weakness or ataxia or  problems with walking or coordination,  change in mood or  memory.        Current Meds  Medication Sig   allopurinol (ZYLOPRIM) 100 MG tablet Take 2 tablets by mouth daily.   aspirin 81 MG tablet Take 81 mg by mouth daily.   chlorpheniramine (CHLOR-TRIMETON) 4 MG tablet Take 4 mg by mouth every 4 (four) hours as needed for allergies.   Colchicine 0.6 MG CAPS Take 0.6 mg by mouth daily. As needed   dicyclomine (BENTYL) 10 MG capsule TAKE 1 CAPSULE (10 MG TOTAL) BY MOUTH 4 (FOUR) TIMES DAILY - BEFORE MEALS AND AT BEDTIME. (Patient taking differently: Take 10 mg by mouth 4 (four) times daily -  before meals and at bedtime. Pt states only takes once per day)   famotidine (PEPCID) 20 MG tablet One after supper   levothyroxine (SYNTHROID) 25 MCG tablet TAKE 1 TABLET DAILY EVERY MORNING ON AND EMPTY STOMACH 30 TO 60 MIN BEFORE FOOD   metoprolol succinate (TOPROL-XL) 25 MG 24 hr tablet TAKE 1 TABLET BY MOUTH EVERY DAY   pantoprazole (PROTONIX) 40 MG tablet Take 30-60 min before first meal of the day   traMADol (ULTRAM) 50 MG tablet Take 1 tablet (50 mg total) by mouth every 4 (four) hours  as needed (for cough or pain).                  Objective:   Wts 02/10/2021       191   01/01/2021     201  02/14/2020     197    11/22/2019       198  07/05/2019       200 12/20/2017     200   11/21/17 205 lb (93 kg)  09/27/17 200 lb (90.7 kg)  08/31/17 200 lb (90.7 kg)      Vital signs reviewed  02/10/2021  - Note at rest 02 sats  94% on RA   General appearance:    pleasant amb wm nad   HEENT : pt wearing mask not removed for exam due to covid -19 concerns.    NECK :  without JVD/Nodes/TM/ nl carotid upstrokes bilaterally   LUNGS: no acc muscle use,  Nl contour chest which is clear to A and P bilaterally without cough on insp or exp maneuvers   CV:  RRR  no s3 or murmur or increase in P2, and no edema   ABD:  Mod distended, hyperresonant, mild sub umbilical tenderness  s guarding/ rebound  soft and nontender with nl inspiratory excursion in the supine position. No bruits or organomegaly appreciated, bowel sounds nl  MS:  Nl gait/ ext warm without deformities, calf tenderness, cyanosis or clubbing No obvious joint restrictions   SKIN: warm and dry without lesions    NEURO:  alert, approp, nl sensorium with  no motor or cerebellar deficits apparent.         Assessment

## 2021-02-10 NOTE — Assessment & Plan Note (Signed)
Onset Fall 2017  FENO 11/21/2017  =   Machine malfunctioned - Allergy profile 11/21/2017 >  Eos 0.4 /  IgE  57 RAST neg  - marked improvement 12/20/2017 on gerd rx - worse 07/05/2019 off gerd rx  - 11/22/2019 restart gerd diet/ 1st gen H1 blockers per guidelines  / stop mints and use delsym otc > some better but still globus > rec trial of gabapentin but never took it - cyclical cough protocol 01/01/2021 >>> resolved as of 02/10/2021   No  Change in rx/ f/u in 3 m to consider de-escalation of RX

## 2021-02-12 ENCOUNTER — Ambulatory Visit (INDEPENDENT_AMBULATORY_CARE_PROVIDER_SITE_OTHER): Payer: Medicare HMO | Admitting: Gastroenterology

## 2021-02-12 ENCOUNTER — Encounter: Payer: Medicare HMO | Admitting: Gastroenterology

## 2021-02-12 ENCOUNTER — Other Ambulatory Visit: Payer: Self-pay | Admitting: Gastroenterology

## 2021-02-12 ENCOUNTER — Encounter: Payer: Self-pay | Admitting: Gastroenterology

## 2021-02-12 VITALS — BP 120/80 | HR 80 | Ht 65.75 in | Wt 192.5 lb

## 2021-02-12 DIAGNOSIS — R053 Chronic cough: Secondary | ICD-10-CM

## 2021-02-12 DIAGNOSIS — Z860100 Personal history of colon polyps, unspecified: Secondary | ICD-10-CM

## 2021-02-12 MED ORDER — PLENVU 140 G PO SOLR: 140.0000 g | ORAL | 0 refills | Status: DC

## 2021-02-12 MED ORDER — DICYCLOMINE HCL 10 MG PO CAPS: 10.0000 mg | ORAL_CAPSULE | Freq: Three times a day (TID) | ORAL | 3 refills | Status: DC

## 2021-02-12 NOTE — Patient Instructions (Addendum)
I have recommended an upper endoscopy and colonoscopy. We will schedule these at your convenience.  Continue to take your pantoprazole 40 mg twice daily and famotidine 20 mg twice daily.  I would start taking the dicyclomine prior to meals.  Hopefully this will provide some improvement in your abdominal symptoms before your endoscopy.   Tips for colonoscopy:  - Stay well hydrated for 3-4 days prior to the exam. This reduces nausea and dehydration.  - To prevent skin/hemorrhoid irritation - prior to wiping, put A&Dointment or vaseline on the toilet paper. - Keep a towel or pad on the bed.  - Drink  64oz of clear liquids in the morning of prep day (prior to starting the prep) to be sure that there is enough fluid to flush the colon and stay hydrated!!!! This is in addition to the fluids required for preparation. - Use of a flavored hard candy, such as grape Anise Salvo, can counteract some of the flavor of the prep and may prevent some nausea.    It was a pleasure to see you today!  Thank you for trusting me with your gastrointestinal care!

## 2021-02-12 NOTE — Progress Notes (Signed)
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  EGD recommended for further evaluation.   Lower abdominal cramping: Some improvement with dicyclomine but incomplete.   History of colon polyp. Tubular adenoma on colonoscopy 2017. Outside gastroenterologist recommended surveillance in 5 years.   PLAN: - Continue pantoprazole to 40 mg twice daily and Pepcid 20 mg twice daily - Increase dicyclomine to 10 mg taken prior to meals - Reviewed GERD lifestyle modifications - EGD and Colonoscopy - If endoscopic evaluation is negative, will try empiric Xifaxan for possible SIBO  I spent over 30 minutes, including in depth chart review, independent review of results, communicating results with the patient directly, face-to-face time with the patient, coordinating care, and ordering studies and medications as appropriate, and documentation.    HPI: [name] is a 70 y.o. male who returns in follow-up ready to proceed with endoscopic evaluation. I met him 12/25/20 when he was under evaluation for chronic cough and surveillance colonoscopy. Pulmonary clearance recommended prior to proceeding with endoscopic evaluation. He returns today reporting that his cough has resolved.   Followed by Dr. Melvyn Wolfe for chronic cough and shortness of breath. Last saw Dr. Melvyn Wolfe 01/01/21. Some improvement in cough since increasing pantoprazole to 40 mg BID and Pepcid to 20 mg BID.   He feels like his cough has completely resolved on PPI and H2B.    Today he reports having lower abdominal cramping with associated diarrhea that improves with dicyclomine. Symptoms are intermittent and unpredictable, although sometimes escalate prior to defecation. Not related to eating or movement.    Prior abdominal imaging: CT of the abdomen and pelvis  without contrast 06/17/2019 for lower abdominal pain showed a right lower lobe pulmonary nodule.  There was a suspected kidney cyst.  Most recent CT chest without contrast to follow-up on the pulmonary nodule was performed 05/15/2020 showed a stable nodule.  Repeat imaging recommended in 2 years.  Prior endoscopic evaluation: EGD and colonoscopy with Dr. Alice Wolfe 11/2015.  EGD was normal.  No biopsies were obtained.  Colonoscopy showed one 3 mm polyp in the distal sigmoid colon that was a tubular adenoma and grade 1 internal hemorrhoids.  Past Medical History:  Diagnosis Date   Anxiety    Atrial fibrillation ([place])    postoperative   BPH (benign prostatic hypertrophy)    Chronic otitis media    left, with chronic TM perforation; also with bilateral conductive hearing loss (sees Dr. Danne Wolfe at [facility])   CKD (chronic kidney disease), stage II 2015   Borderline stage II/III: CrCl'@60'  ml/min   Gout    History of non anemic vitamin B12 deficiency 2012   Hypogonadism male 08/2010   Axiron started--?compliance? (repeat value 01/2011 mildly improved).   Mitral regurgitation 04/30/2012   Mitral Valve Prolapse with Severe Mitral Regurgitation    a. 01/2006 s/p MV Repair w/ 30 mm Edwards physio ring annuloplasty;  b. 06/2013 Echo: Ef 60-65%, no rwma, triv AI, dilated Ao root (43m), mild to mod MS, mild MR, mildly dil LA.   Obesity    Sleep apnea 07/04/2016   Urge incontinence    Viral URI 02/18/2014    Past Surgical History:  Procedure Laterality Date   COLONOSCOPY  2003   Normal per pt: Dr. DAnthony Wolfe [facility]  doing fine  since   MITRAL VALVE REPAIR  2007   Dr. Roxy Wolfe   TRANSTHORACIC ECHOCARDIOGRAM  06/2012; 06/2013   moderate MR (MV repair)--stable compared to prior echo and pt is asymptomatic.  Repeat echo 06/2013: Stable MV repair.  Mild regurg.  Mild AO root dilatation. No change in therapy.     Current Outpatient Medications  Medication Sig Dispense Refill    allopurinol (ZYLOPRIM) 100 MG tablet Take 2 tablets by mouth daily.     aspirin 81 MG tablet Take 81 mg by mouth daily.     dicyclomine (BENTYL) 10 MG capsule TAKE 1 CAPSULE (10 MG TOTAL) BY MOUTH 4 (FOUR) TIMES DAILY - BEFORE MEALS AND AT BEDTIME. (Patient taking differently: Take 10 mg by mouth 4 (four) times daily -  before meals and at bedtime. Pt states only takes once per day) 60 capsule 2   famotidine (PEPCID) 20 MG tablet One after supper 30 tablet 11   levothyroxine (SYNTHROID) 25 MCG tablet TAKE 1 TABLET DAILY EVERY MORNING ON AND EMPTY STOMACH 30 TO 60 MIN BEFORE FOOD     metoprolol succinate (TOPROL-XL) 25 MG 24 hr tablet TAKE 1 TABLET BY MOUTH EVERY DAY 90 tablet 3   pantoprazole (PROTONIX) 40 MG tablet Take 30-60 min before first meal of the day 90 tablet 1   Colchicine 0.6 MG CAPS Take 0.6 mg by mouth daily. As needed (Patient not taking: Reported on 02/12/2021)     traMADol (ULTRAM) 50 MG tablet Take 1 tablet (50 mg total) by mouth every 4 (four) hours as needed (for cough or pain). (Patient not taking: Reported on 02/12/2021) 30 tablet 0   No current facility-administered medications for this visit.    Allergies as of 02/12/2021 - Review Complete 02/12/2021  Allergen Reaction Noted   Ivp dye [iodinated diagnostic agents] Anaphylaxis 07/03/2014   Prednisone Other (See Comments) 09/25/2014     Physical Exam: General:   Alert,  well-nourished, pleasant and cooperative in NAD Head:  Normocephalic and atraumatic. Eyes:  Sclera clear, no icterus.   Conjunctiva pink. Abdomen:  Soft, nontender, nondistended, normal bowel sounds, no rebound or guarding. No hepatosplenomegaly.   Neurologic:  Alert and  oriented x4;  grossly nonfocal Skin:  Intact without significant lesions or rashes. Psych:  Alert and cooperative. Normal mood and affect.     [name] L. Tarri Glenn, MD, MPH 02/12/2021, 9:34 AM

## 2021-03-03 ENCOUNTER — Telehealth: Payer: Self-pay | Admitting: Gastroenterology

## 2021-03-03 NOTE — Telephone Encounter (Signed)
Per Covid policy: if a pt is unvaccinated and is exposed to Covid-19 and is asymptomatic. They should test 5 days after exposure and if negative they can have the procedure 7 days after exposure. RN verified that pt is unvaccinated. RN made pt aware of policy. Friday 03/05/21 will be 5 days after exposure. Pt will test then and if negative, he will call back to reschedule. Dr. Tarri Glenn made aware.

## 2021-03-03 NOTE — Telephone Encounter (Signed)
Patient has endo/colon tomorrow but wanted to call and let you know he was exposed to someone who had COVID on Saturday night.  He says he is not exhibiting any symptoms.  Please call patient and let him know how he should proceed.  Thank you.

## 2021-03-03 NOTE — Telephone Encounter (Signed)
Dr. Tarri Glenn please advise.

## 2021-03-04 ENCOUNTER — Encounter: Payer: Medicare HMO | Admitting: Gastroenterology

## 2021-03-10 NOTE — Telephone Encounter (Signed)
Inbound call from patient called to reschedule procedures. Have a negative test. Scheduled for 03/24/2021

## 2021-03-24 ENCOUNTER — Encounter: Payer: Self-pay | Admitting: Gastroenterology

## 2021-03-24 ENCOUNTER — Other Ambulatory Visit: Payer: Self-pay

## 2021-03-24 ENCOUNTER — Ambulatory Visit (AMBULATORY_SURGERY_CENTER): Payer: Medicare HMO | Admitting: Gastroenterology

## 2021-03-24 VITALS — BP 127/84 | HR 81 | Temp 96.6°F | Resp 14 | Ht 65.75 in | Wt 192.0 lb

## 2021-03-24 DIAGNOSIS — K2970 Gastritis, unspecified, without bleeding: Secondary | ICD-10-CM

## 2021-03-24 DIAGNOSIS — D125 Benign neoplasm of sigmoid colon: Secondary | ICD-10-CM

## 2021-03-24 DIAGNOSIS — D122 Benign neoplasm of ascending colon: Secondary | ICD-10-CM

## 2021-03-24 DIAGNOSIS — Z86010 Personal history of colonic polyps: Secondary | ICD-10-CM

## 2021-03-24 DIAGNOSIS — R053 Chronic cough: Secondary | ICD-10-CM

## 2021-03-24 DIAGNOSIS — D123 Benign neoplasm of transverse colon: Secondary | ICD-10-CM | POA: Diagnosis not present

## 2021-03-24 DIAGNOSIS — K219 Gastro-esophageal reflux disease without esophagitis: Secondary | ICD-10-CM | POA: Diagnosis not present

## 2021-03-24 DIAGNOSIS — K3189 Other diseases of stomach and duodenum: Secondary | ICD-10-CM | POA: Diagnosis not present

## 2021-03-24 DIAGNOSIS — K317 Polyp of stomach and duodenum: Secondary | ICD-10-CM

## 2021-03-24 MED ORDER — SODIUM CHLORIDE 0.9 % IV SOLN: 500.0000 mL | Freq: Once | INTRAVENOUS | Status: DC

## 2021-03-24 NOTE — Progress Notes (Signed)
Called to room to assist during endoscopic procedure.  Patient ID and intended procedure confirmed with present staff. Received instructions for my participation in the procedure from the performing physician.  

## 2021-03-24 NOTE — Op Note (Signed)
[facility] Patient Name: [name] Procedure Date: 03/24/2021 11:11 AM MRN: [id number] Endoscopist: Thornton Park MD, MD Age: 71 Referring MD:  Date of Birth: 04-10-1950 Gender: Male Account #: [id number] Procedure:                Upper GI endoscopy Indications:              Chronic cough Medicines:                Monitored Anesthesia Care Procedure:                Pre-Anesthesia Assessment:                           - Prior to the procedure, a History and Physical                            was performed, and patient medications and                            allergies were reviewed. The patient's tolerance of                            previous anesthesia was also reviewed. The risks                            and benefits of the procedure and the sedation                            options and risks were discussed with the patient.                            All questions were answered, and informed consent                            was obtained. Prior Anticoagulants: The patient has                            taken no previous anticoagulant or antiplatelet                            agents. ASA Grade Assessment: III - A patient with                            severe systemic disease. After reviewing the risks                            and benefits, the patient was deemed in                            satisfactory condition to undergo the procedure.                           After obtaining informed consent, the endoscope was  passed under direct vision. Throughout the                            procedure, the patient's blood pressure, pulse, and                            oxygen saturations were monitored continuously. The                            GIF [id number] #[id number] was introduced through the                            mouth, and advanced to the third part of duodenum.                            The upper GI endoscopy was accomplished  without                            difficulty. The patient tolerated the procedure                            well. Scope In: Scope Out: Findings:                 The examined esophagus was normal. Biopsies were                            obtained from the mid/proximal and distal esophagus                            with cold forceps for histology.                           Diffuse mild inflammation characterized by                            erythema, friability and granularity was found in                            the gastric body. Biopsies were taken from the                            antrum, body, and fundus with a cold forceps for                            histology. Estimated blood loss was minimal.                           A single 5 mm sessile polyp with no bleeding and no                            stigmata of recent bleeding was found in the  gastric body. Biopsies were taken with a cold                            forceps for histology. Estimated blood loss was                            minimal.                           The duodenal bulb was normal except for patchy                            erythema. Biopsies were taken with a cold forceps                            for histology. Estimated blood loss was minimal. Complications:            No immediate complications. Estimated blood loss:                            Minimal. Estimated Blood Loss:     Estimated blood loss was minimal. Impression:               - Normal esophagus. Biopsied.                           - Mild gastritis. Biopsied.                           - A single gastric polyp. Biopsied.                           - Mild patchy duodenal erythema. Biopsied. Recommendation:           - Patient has a contact number available for                            emergencies. The signs and symptoms of potential                            delayed complications were discussed with the                             patient. Return to normal activities tomorrow.                            Written discharge instructions were provided to the                            patient.                           - Resume previous diet.                           - Continue present medications including  pantoprazole 40 mg BID and famotidine 20 mg BID.                           - Await pathology results. Thornton Park MD, MD 03/24/2021 11:59:47 AM This report has been signed electronically.

## 2021-03-24 NOTE — Progress Notes (Signed)
Pt states in RR- "I feel awful."  He will not explain how he feels, but after asking many questions he denies having pain, but c/o feeling weak. VSS while in the RR No c/o chest discomfort and SOB.  He says, "I felt this way before the procedure, nothing has changed." Blood sugar checked- 104.   His wife states, "He had to get up so early and he gets like this sometimes from not eating."  OJ and crackers given to pt prior to DC.  Pt states, "I'll go home now."

## 2021-03-24 NOTE — Op Note (Signed)
[place] Patient Name: [name] Procedure Date: 03/24/2021 11:10 AM MRN: [id number] Endoscopist: Thornton Park MD, MD Age: 71 Referring MD:  Date of Birth: 1950-04-07 Gender: Male Account #: [id number] Procedure:                Colonoscopy Indications:              Surveillance: Personal history of adenomatous                            polyps on last colonoscopy > 5 years ago                           Tubular adenoma with Dr. [name] in 2017 Medicines:                Monitored Anesthesia Care Procedure:                Pre-Anesthesia Assessment:                           - Prior to the procedure, a History and Physical                            was performed, and patient medications and                            allergies were reviewed. The patient's tolerance of                            previous anesthesia was also reviewed. The risks                            and benefits of the procedure and the sedation                            options and risks were discussed with the patient.                            All questions were answered, and informed consent                            was obtained. Prior Anticoagulants: The patient has                            taken no previous anticoagulant or antiplatelet                            agents. ASA Grade Assessment: III - A patient with                            severe systemic disease. After reviewing the risks                            and benefits, the patient was deemed in  satisfactory condition to undergo the procedure.                           After obtaining informed consent, the colonoscope                            was passed under direct vision. Throughout the                            procedure, the patient's blood pressure, pulse, and                            oxygen saturations were monitored continuously. The                            CF [id number] #[id number] was  introduced through the anus                            and advanced to the 3 cm into the ileum. A second                            forward view of the right colon. The colonoscopy                            was performed without difficulty. The patient                            tolerated the procedure well. The quality of the                            bowel preparation was good. The terminal ileum,                            ileocecal valve, appendiceal orifice, and rectum                            were photographed. Scope In: 11:31:46 AM Scope Out: 11:54:23 AM Scope Withdrawal Time: 0 hours 17 minutes 38 seconds  Total Procedure Duration: 0 hours 22 minutes 37 seconds  Findings:                 The perianal and digital rectal examinations were                            normal.                           Non-bleeding internal hemorrhoids were found.                           A 4 mm polyp was found in the sigmoid colon. The                            polyp was sessile. The polyp was removed with a  cold snare. Resection and retrieval were complete.                            Estimated blood loss was minimal.                           Two sessile polyps were found in the splenic                            flexure. The polyps were 2 to 3 mm in size. These                            polyps were removed with a cold snare. Residual                            polyp had to be removed with cold forceps after the                            snare repeatedly slid over the polyps. Resection                            and retrieval were complete. Estimated blood loss                            was minimal.                           A 4 mm polyp was found in the hepatic flexure. The                            polyp was sessile. The polyp was removed with a                            cold snare. Resection and retrieval were complete.                            Estimated  blood loss was minimal.                           A 8 mm polyp was found in the proximal ascending                            colon. The polyp was sessile. The polyp was removed                            with a cold snare. Resection and retrieval were                            complete. Estimated blood loss was minimal.                           The exam was otherwise without abnormality on  direct and retroflexion views. Complications:            No immediate complications. Estimated blood loss:                            Minimal. Estimated Blood Loss:     Estimated blood loss was minimal. Impression:               - Non-bleeding internal hemorrhoids.                           - One 4 mm polyp in the sigmoid colon, removed with                            a cold snare. Resected and retrieved.                           - Two 2 to 3 mm polyps at the splenic flexure,                            removed with a cold snare. Resected and retrieved.                           - One 4 mm polyp at the hepatic flexure, removed                            with a cold snare. Resected and retrieved.                           - One 8 mm polyp in the proximal ascending colon,                            removed with a cold snare. Resected and retrieved.                           - The examination was otherwise normal on direct                            and retroflexion views. Recommendation:           - Patient has a contact number available for                            emergencies. The signs and symptoms of potential                            delayed complications were discussed with the                            patient. Return to normal activities tomorrow.                            Written discharge instructions were provided to the  patient.                           - Resume previous diet.                           - Continue present  medications.                           - Await pathology results.                           - Repeat colonoscopy date to be determined after                            pending pathology results are reviewed for                            surveillance.                           - Emerging evidence supports eating a diet of                            fruits, vegetables, grains, calcium, and yogurt                            while reducing red meat and alcohol may reduce the                            risk of colon cancer.                           - Thank you for allowing me to be involved in your                            colon cancer prevention. Thornton Park MD, MD 03/24/2021 12:04:40 PM This report has been signed electronically.

## 2021-03-24 NOTE — Progress Notes (Signed)
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  EGD recommended for further evaluation.    Lower abdominal cramping: Some improvement with dicyclomine but incomplete.    History of colon polyp. Tubular adenoma on colonoscopy 2017. Outside gastroenterologist recommended surveillance in 5 years.   Appropriate candidate for monitored anesthesia care  PLAN: Colonoscopy in the [place] today   HPI: [name] is a 71 y.o. male presents for endoscopic evaluation of chronic cough, reflux, and colon cancer surveillance. Please see my 02/12/21 office note for complete details. There has been no significant change in history of physical exam since that time.   Followed by [name] for chronic cough and shortness of breath. Last saw [name] 01/01/21. Some improvement in cough since increasing pantoprazole to 40 mg BID and Pepcid to 20 mg BID.   Prior endoscopic evaluation: EGD and colonoscopy with Dr. [name] 11/2015.  EGD was normal.  No biopsies were obtained.  Colonoscopy showed one 3 mm polyp in the distal sigmoid colon that was a tubular adenoma and grade 1 internal hemorrhoids.    Past Medical History:  Diagnosis Date   Anxiety    Atrial fibrillation ([place])    postoperative   BPH (benign prostatic hypertrophy)    Chronic otitis media    left, with chronic TM perforation; also with bilateral conductive hearing loss (sees Dr. [name] at [facility])   CKD (chronic kidney disease), stage II 2015   Borderline stage II/III: CrCl@60  ml/min   Gout    History of non anemic vitamin B12 deficiency 2012   Hypogonadism male 08/2010   Axiron started--?compliance? (repeat value 01/2011 mildly improved).   Mitral regurgitation 04/30/2012   Mitral Valve Prolapse with Severe Mitral Regurgitation    a. 01/2006  s/p MV Repair w/ 30 mm Edwards physio ring annuloplasty;  b. 06/2013 Echo: Ef 60-65%, no rwma, triv AI, dilated Ao root (68mm), mild to mod MS, mild MR, mildly dil LA.   Obesity    Sleep apnea 07/04/2016   Urge incontinence    Viral URI 02/18/2014    Past Surgical History:  Procedure Laterality Date   COLONOSCOPY  2003   Normal per pt: Dr. Anthony Sar in [place]  2000   doing fine since   MITRAL VALVE REPAIR  2007   Dr. [name]   TRANSTHORACIC ECHOCARDIOGRAM  06/2012; 06/2013   moderate MR (MV repair)--stable compared to prior echo and pt is asymptomatic.  Repeat echo 06/2013: Stable MV repair.  Mild regurg.  Mild AO root dilatation. No change in therapy.    Current Outpatient Medications  Medication Sig Dispense Refill   aspirin 81 MG tablet Take 81 mg by mouth daily.     famotidine (PEPCID) 20 MG tablet One after supper 30 tablet 11   levothyroxine (SYNTHROID) 25 MCG tablet TAKE 1 TABLET DAILY EVERY MORNING ON AND EMPTY STOMACH 30 TO 60 MIN BEFORE FOOD     metoprolol succinate (TOPROL-XL) 25 MG 24 hr tablet TAKE 1 TABLET BY MOUTH EVERY DAY 90 tablet 3   pantoprazole (PROTONIX) 40 MG tablet Take 30-60 min before first meal of the day 90 tablet 1   allopurinol (ZYLOPRIM) 100 MG tablet Take 2 tablets by mouth daily.     Colchicine 0.6 MG CAPS Take 0.6 mg by mouth daily. As needed (  Patient not taking: Reported on 02/12/2021)     dicyclomine (BENTYL) 10 MG capsule Take 1 capsule (10 mg total) by mouth 4 (four) times daily -  before meals and at bedtime. 120 capsule 3   traMADol (ULTRAM) 50 MG tablet Take 1 tablet (50 mg total) by mouth every 4 (four) hours as needed (for cough or pain). (Patient not taking: Reported on 02/12/2021) 30 tablet 0   Current Facility-Administered Medications  Medication Dose Route Frequency Provider Last Rate Last Admin   0.9 %  sodium chloride infusion  500 mL Intravenous Once Thornton Park, MD        Allergies as of 03/24/2021 - Review Complete  03/24/2021  Allergen Reaction Noted   Ivp dye [iodinated contrast media] Anaphylaxis 07/03/2014   Prednisone Other (See Comments) 09/25/2014    Family History  Problem Relation Age of Onset   Alcohol abuse Mother    Alcohol abuse Father    Prostate cancer Father        prostate   Bladder Cancer Father    Throat cancer Maternal Uncle    Stomach cancer Neg Hx    Colon cancer Neg Hx    Esophageal cancer Neg Hx    Pancreatic cancer Neg Hx    Rectal cancer Neg Hx      Physical Exam: General:   Alert,  well-nourished, pleasant and cooperative in NAD Head:  Normocephalic and atraumatic. Eyes:  Sclera clear, no icterus.   Conjunctiva pink. Mouth:  No deformity or lesions.   Neck:  Supple; no masses or thyromegaly. Lungs:  Clear throughout to auscultation.   No wheezes. Heart:  Regular rate and rhythm; no murmurs. Abdomen:  Soft, non-tender, nondistended, normal bowel sounds, no rebound or guarding.  Msk:  Symmetrical. No boney deformities LAD: No inguinal or umbilical LAD Extremities:  No clubbing or edema. Neurologic:  Alert and  oriented x4;  grossly nonfocal Skin:  No obvious rash or bruise. Psych:  Alert and cooperative. Normal mood and affect.     Studies/Results: No results found.    Tavares Levinson L. Tarri Glenn, MD, MPH 03/24/2021, 11:05 AM

## 2021-03-24 NOTE — Progress Notes (Signed)
To pacu, VSS. Report to [email or web address] 

## 2021-03-24 NOTE — Patient Instructions (Signed)
Await pathology- 5 polyps removed and biopsies taken from the duodenum, stomach and esophagus  Please read over handouts about gastritis, polyps, and hemorrhoids  Continue your normal medications including Pantoprazole 40 mg twice daily and Famotidine 20 mg twice daily  Emerging evidence supports eating a diet of fruits, vegetables, grains, calcium, and yogurt while reducing red meat and alcohol may reduce the risk of colon cancer  YOU HAD AN ENDOSCOPIC PROCEDURE TODAY AT [facility]:   Refer to the procedure report that was given to you for any specific questions about what was found during the examination.  If the procedure report does not answer your questions, please call your gastroenterologist to clarify.  If you requested that your care partner not be given the details of your procedure findings, then the procedure report has been included in a sealed envelope for you to review at your convenience later.  YOU SHOULD EXPECT: Some feelings of bloating in the abdomen. Passage of more gas than usual.  Walking can help get rid of the air that was put into your GI tract during the procedure and reduce the bloating. If you had a lower endoscopy (such as a colonoscopy or flexible sigmoidoscopy) you may notice spotting of blood in your stool or on the toilet paper. If you underwent a bowel prep for your procedure, you may not have a normal bowel movement for a few days.  Please Note:  You might notice some irritation and congestion in your nose or some drainage.  This is from the oxygen used during your procedure.  There is no need for concern and it should clear up in a day or so.  SYMPTOMS TO REPORT IMMEDIATELY:  Following lower endoscopy (colonoscopy or flexible sigmoidoscopy):  Excessive amounts of blood in the stool  Significant tenderness or worsening of abdominal pains  Swelling of the abdomen that is new, acute  Fever of 100F or higher  Following upper endoscopy  (EGD)  Vomiting of blood or coffee ground material  New chest pain or pain under the shoulder blades  Painful or persistently difficult swallowing  New shortness of breath  Fever of 100F or higher  Black, tarry-looking stools  For urgent or emergent issues, a gastroenterologist can be reached at any hour by calling [phone]. Do not use MyChart messaging for urgent concerns.    DIET:  We do recommend a small meal at first, but then you may proceed to your regular diet.  Drink plenty of fluids but you should avoid alcoholic beverages for 24 hours.  ACTIVITY:  You should plan to take it easy for the rest of today and you should NOT DRIVE or use heavy machinery until tomorrow (because of the sedation medicines used during the test).    FOLLOW UP: Our staff will call the number listed on your records 48-72 hours following your procedure to check on you and address any questions or concerns that you may have regarding the information given to you following your procedure. If we do not reach you, we will leave a message.  We will attempt to reach you two times.  During this call, we will ask if you have developed any symptoms of COVID 19. If you develop any symptoms (ie: fever, flu-like symptoms, shortness of breath, cough etc.) before then, please call [phone].  If you test positive for Covid 19 in the 2 weeks post procedure, please call and report this information to [place].    If any biopsies were  taken you will be contacted by phone or by letter within the next 1-3 weeks.  Please call us at [phone] if you have not heard about the biopsies in 3 weeks.    SIGNATURES/CONFIDENTIALITY: You and/or your care partner have signed paperwork which will be entered into your electronic medical record.  These signatures attest to the fact that that the information above on your After Visit Summary has been reviewed and is understood.  Full responsibility of the confidentiality of this discharge  information lies with you and/or your care-partner.

## 2021-03-24 NOTE — Progress Notes (Signed)
VS by DT  Pt's states no medical or surgical changes since previsit or office visit.  

## 2021-03-25 ENCOUNTER — Other Ambulatory Visit: Payer: Self-pay

## 2021-03-25 DIAGNOSIS — K219 Gastro-esophageal reflux disease without esophagitis: Secondary | ICD-10-CM

## 2021-03-25 MED ORDER — PANTOPRAZOLE SODIUM 40 MG PO TBEC: 40.0000 mg | DELAYED_RELEASE_TABLET | Freq: Two times a day (BID) | ORAL | 0 refills | Status: DC

## 2021-03-25 MED ORDER — FAMOTIDINE 20 MG PO TABS: 20.0000 mg | ORAL_TABLET | Freq: Two times a day (BID) | ORAL | 0 refills | Status: DC

## 2021-03-26 ENCOUNTER — Telehealth: Payer: Self-pay | Admitting: *Deleted

## 2021-03-26 ENCOUNTER — Encounter: Payer: Self-pay | Admitting: Gastroenterology

## 2021-03-26 NOTE — Telephone Encounter (Signed)
°  Follow up Call-  Call back number 03/24/2021  Post procedure Call Back phone  # [phone]- wifes number, okay to talk to wife  Permission to leave phone message Yes  Some recent data might be hidden     Patient questions:  Do you have a fever, pain , or abdominal swelling? No. Pain Score  0 *  Have you tolerated food without any problems? Yes.    Have you been able to return to your normal activities? Yes.    Do you have any questions about your discharge instructions: Diet   No. Medications  No. Follow up visit  No.  Do you have questions or concerns about your Care? No.  Actions: * If pain score is 4 or above: No action needed, pain <4.  Have you developed a fever since your procedure? no  2.   Have you had an respiratory symptoms (SOB or cough) since your procedure? no  3.   Have you tested positive for COVID 19 since your procedure no  4.   Have you had any family members/close contacts diagnosed with the COVID 19 since your procedure?  no   If yes to any of these questions please route to Joylene John, RN and Joella Prince, RN

## 2021-06-17 ENCOUNTER — Other Ambulatory Visit: Payer: Self-pay | Admitting: Internal Medicine

## 2021-06-17 DIAGNOSIS — R911 Solitary pulmonary nodule: Secondary | ICD-10-CM

## 2021-06-18 ENCOUNTER — Other Ambulatory Visit: Payer: Self-pay | Admitting: Gastroenterology

## 2021-06-30 ENCOUNTER — Ambulatory Visit (INDEPENDENT_AMBULATORY_CARE_PROVIDER_SITE_OTHER)
Admission: RE | Admit: 2021-06-30 | Discharge: 2021-06-30 | Disposition: A | Discharge status: Home / Self Care | Payer: Medicare HMO | Source: Ambulatory Visit | Attending: Internal Medicine | Admitting: Internal Medicine

## 2021-06-30 DIAGNOSIS — R911 Solitary pulmonary nodule: Secondary | ICD-10-CM

## 2021-07-01 NOTE — Progress Notes (Signed)
Spoke with pt's spouse, Freda Munro, [place] per [facility] and notified of results per Dr Melvyn Novas . Pt verbalized understanding and denied any questions.   ?

## 2021-07-10 ENCOUNTER — Other Ambulatory Visit: Payer: Self-pay | Admitting: Gastroenterology

## 2021-07-10 DIAGNOSIS — K219 Gastro-esophageal reflux disease without esophagitis: Secondary | ICD-10-CM

## 2021-08-23 ENCOUNTER — Other Ambulatory Visit: Payer: Self-pay | Admitting: Gastroenterology

## 2021-08-23 DIAGNOSIS — K219 Gastro-esophageal reflux disease without esophagitis: Secondary | ICD-10-CM

## 2021-08-26 ENCOUNTER — Other Ambulatory Visit: Payer: Self-pay | Admitting: Gastroenterology

## 2021-08-26 ENCOUNTER — Ambulatory Visit: Payer: Medicare HMO | Admitting: Gastroenterology

## 2021-08-26 ENCOUNTER — Encounter: Payer: Self-pay | Admitting: Gastroenterology

## 2021-08-26 VITALS — BP 122/80 | HR 82 | Ht 66.0 in | Wt 193.5 lb

## 2021-08-26 DIAGNOSIS — K2100 Gastro-esophageal reflux disease with esophagitis, without bleeding: Secondary | ICD-10-CM

## 2021-08-26 MED ORDER — DEXLANSOPRAZOLE 60 MG PO CPDR: 60.0000 mg | DELAYED_RELEASE_CAPSULE | Freq: Every day | ORAL | 3 refills | Status: DC

## 2021-08-26 NOTE — Patient Instructions (Addendum)
If you are age 71 or older, your body mass index should be between 23-30. Your Body mass index is 31.23 kg/m. If this is out of the aforementioned range listed, please consider follow up with your Primary Care Provider.  If you are age 12 or younger, your body mass index should be between 19-25. Your Body mass index is 31.23 kg/m. If this is out of the aformentioned range listed, please consider follow up with your Primary Care Provider.   The [facility] providers would like to encourage you to use [facility] to communicate with providers for non-urgent requests or questions.  Due to long hold times on the telephone, sending your provider a message by [facility] may be a faster and more efficient way to get a response.  Please allow 48 business hours for a response.  Please remember that this is for non-urgent requests.   Your cough may be related to reflux. However, please follow-up with your primary care doctor as the cough and phlegm and runny nose may be due to other things.  Will try to switch your pantoprazole to Dexilant. If approved by your insurance, you would take this medicine instead of the pantoprazole and just once a day.   Modifying diet and lifestyle remains the foundation for treating the symptoms of reflux.   The following strategies help you prevent heartburn and other symptoms by avoiding foods that reduce the effectiveness of the bottom of the esophagus from protecting the esophagus from from acid injury and keeping stomach contents where they belong.  Eat smaller meals. A large meal remains in the stomach for several hours, increasing the chances for gastroesophageal reflux. Try distributing your daily food intake over three, four, or five smaller meals.  Relax when you eat. Stress increases the production of stomach acid, so make meals a pleasant, relaxing experience. Sit down. Eat slowly. Chew completely. Play soothing music.  Relax between meals. Relaxation therapies such as  deep breathing, meditation, massage, tai chi, or yoga may help prevent and relieve heartburn.   Remain upright after eating. You should maintain postures that reduce the risk for reflux for at least three hours after eating. For example, don't bend over or strain to lift heavy objects.  Avoid eating within three hours of going to bed. Lying down after eating will increase chances of reflux.  Lose weight. Excess pounds increase pressure on the stomach and can push acid into the esophagus.  Loosen up. Avoid tight belts, waistbands, and other clothing that puts pressure on your stomach.  Avoid foods that burn. Abstain from food or drink that increases gastric acid secretion, decreases the valve at the bottom of the esophagus, or slows the emptying of the stomach. Known offenders include high-fat foods, spicy dishes, tomatoes and tomato products, citrus fruits, garlic, onions, milk, carbonated drinks, coffee (including decaf), tea, chocolate, mints, and alcohol.  Avoid medications that can predispose you to reflux including aspirin and other NSAIDs, oral contraceptives, hormone therapy drugs, and certain antidepressants.  Sleep at an angle. If you're bothered by nighttime heartburn, place a wedge (available in medical supply stores or a wedge pillow through [employer]) under your upper body. But don't elevate your head with extra pillows. That makes reflux worse by bending you at the waist and compressing your stomach. You might also try sleeping on your left side, as studies have shown this can help--perhaps because the stomach is on the left side of the body, so lying on your left positions most of the  stomach below the bottom of the esophagus.  It was my pleasure to provide care to you today. Based on our discussion, I am providing you with my recommendations below:  RECOMMENDATION(S):    BMI:  If you are age 58 or older, your body mass index should be between 23-30. Your Body mass index is 31.23  kg/m. If this is out of the aforementioned range listed, please consider follow up with your Primary Care Provider.  If you are age 64 or younger, your body mass index should be between 19-25. Your Body mass index is 31.23 kg/m. If this is out of the aformentioned range listed, please consider follow up with your Primary Care Provider.   MY CHART:  The  GI providers would like to encourage you to use [facility] to communicate with providers for non-urgent requests or questions.  Due to long hold times on the telephone, sending your provider a message by [facility] may be a faster and more efficient way to get a response.  Please allow 48 business hours for a response.  Please remember that this is for non-urgent requests.   Thank you for trusting me with your gastrointestinal care!    Thornton Park, MD, MPH

## 2021-08-26 NOTE — Progress Notes (Signed)
Referring Provider: Medicine, [place]* Primary Care Physician:  Medicine, [facility]   Chief complaint:  Chronic cough and history of colon polyp  IMPRESSION:  Chronic cough and reflux:  Initially responded to PPI BID and H2B. Now with recurrent symptoms. I suspect there is a component of LPR.  However, other etiologies must be considered. Esophageal biopsies showed changes of reflux but no EOE. Gastric biopsies were essentially normal.    History of colon polyps. Tubular adenoma on colonoscopy 2017.  Four tubular adenomas on colonoscopy 2023. Surveillance due in 3 years.   PLAN: - Switch pantoprazole to 40 mg BID to Dexilant 60 mg daily - Consider follow-up with your ENT doctor for further evaluation - Surveillance colonoscopy 2026  I spent over 30 minutes, including in depth chart review, independent review of results, communicating results with the patient directly, face-to-face time with the patient, coordinating care, and ordering studies and medications as appropriate, and documentation.    HPI: [name] is a 71 y.o. male who returns in follow-up. His wife accompanies him to this appointment and contributes to the history.   Followed by [name] for chronic cough and shortness of breath. Last saw [name] 01/01/21. Cough initially improved after increasing pantoprazole to 40 mg BID and Pepcid to 20 mg BID. However, the frequency seems to be recurring.   He has early morning runny nose like a faucet and feels this triggers his cough. He was treated with allergy medicine but he doesn't take it daily.   He drinks Coke every night and his wife notes that his symptoms are worsened the mornings after he drinks a soda.  No other identified exacerbating or relieving features. No anorexia, unexplained weight loss, dysphagia, odynophagia, persistent vomiting, or gastrointestinal cancer in a first-degree relative.   No further diarrhea.   EGD 03/24/21 showed  a normal esophagitis, gastritis, and a 67m gastric polyp. Esophageal biopsies show reflux effects. Gastric biopsies show reactive gastropathy. Duodenal biopsies were normal. There was no EOE or H pylori.   Colonoscopy 03/24/21 showed 5 colon polyps. Four polyps were adenomas, one was a sigmoid  hyperplastic polyp. He did not feel well with the bowel prep and was unhappy with the amount of diarrhea that occurred both before and after the procedure.   Prior abdominal imaging: CT of the abdomen and pelvis without contrast 06/17/2019 for lower abdominal pain showed a right lower lobe pulmonary nodule.  There was a suspected kidney cyst.  Most recent CT chest without contrast to follow-up on the pulmonary nodule was performed 06/30/21 showed two small right pulmonary nodules. No further follow-up recommended.   Prior endoscopic evaluation: - EGD and colonoscopy with Dr. TAlice Reichert9/2017.  EGD was normal.  No biopsies were obtained.  Colonoscopy showed one 3 mm polyp in the distal sigmoid colon that was a tubular adenoma and grade 1 internal hemorrhoids. - EGD 03/24/21 showed a normal esophagitis, gastritis, and a 560mgastric polyp. Esophageal biopsies show reflux effects. Gastric biopsies show reactive gastropathy. Duodenal biopsies were normal. There was no EOE or H pylori.  - Colonoscopy 03/24/21 showed 5 colon polyps. Four polyps were adenomas, one was a sigmoid  hyperplastic polyp. He did not feel well with the bowel prep and was unhappy with the amount of diarrhea that occurred both before and after the procedure.   Past Medical History:  Diagnosis Date   Anxiety    Atrial fibrillation (HCC)    postoperative   BPH (benign  prostatic hypertrophy)    Chronic otitis media    left, with chronic TM perforation; also with bilateral conductive hearing loss (sees Dr. [name] at [facility])   CKD (chronic kidney disease), stage II 2015   Borderline stage II/III: CrCl'@60'$  ml/min   Gout    History of non anemic  vitamin B12 deficiency 2012   Hypogonadism male 08/2010   Axiron started--?compliance? (repeat value 01/2011 mildly improved).   Mitral regurgitation 04/30/2012   Mitral Valve Prolapse with Severe Mitral Regurgitation    a. 01/2006 s/p MV Repair w/ 30 mm Edwards physio ring annuloplasty;  b. 06/2013 Echo: Ef 60-65%, no rwma, triv AI, dilated Ao root (61m), mild to mod MS, mild MR, mildly dil LA.   Obesity    Sleep apnea 07/04/2016   Urge incontinence    Viral URI 02/18/2014    Past Surgical History:  Procedure Laterality Date   COLONOSCOPY  2003   Normal per pt: Dr. DAnthony Sarin ERamos 2000   doing fine since   MITRAL VALVE REPAIR  2007   Dr. ORoxy Manns  TRANSTHORACIC ECHOCARDIOGRAM  06/2012; 06/2013   moderate MR (MV repair)--stable compared to prior echo and pt is asymptomatic.  Repeat echo 06/2013: Stable MV repair.  Mild regurg.  Mild AO root dilatation. No change in therapy.     Current Outpatient Medications  Medication Sig Dispense Refill   allopurinol (ZYLOPRIM) 100 MG tablet Take 2 tablets by mouth daily.     aspirin 81 MG tablet Take 81 mg by mouth daily.     dicyclomine (BENTYL) 10 MG capsule TAKE 1 CAPSULE (10 MG TOTAL) BY MOUTH 4 TIMES A DAY BEFORE MEALS AND AT BEDTIME 360 capsule 1   famotidine (PEPCID) 20 MG tablet TAKE 1 TABLET (20 MG TOTAL) BY MOUTH 2 (TWO) TIMES DAILY. ONE AFTER SUPPER 180 tablet 0   levothyroxine (SYNTHROID) 25 MCG tablet TAKE 1 TABLET DAILY EVERY MORNING ON AND EMPTY STOMACH 30 TO 60 MIN BEFORE FOOD     metoprolol succinate (TOPROL-XL) 25 MG 24 hr tablet TAKE 1 TABLET BY MOUTH EVERY DAY 90 tablet 3   pantoprazole (PROTONIX) 40 MG tablet TAKE 1 TABLET BY MOUTH TWICE A DAY 180 tablet 3   No current facility-administered medications for this visit.       Physical Exam: General:   Alert,  well-nourished, pleasant and cooperative in NAD Head:  Normocephalic and atraumatic. Eyes:  Sclera clear, no icterus.   Conjunctiva pink. Abdomen:   Soft, nontender, nondistended, normal bowel sounds, no rebound or guarding. No hepatosplenomegaly.   Neurologic:  Alert and  oriented x4;  grossly nonfocal Skin:  Intact without significant lesions or rashes. Psych:  Alert and cooperative. Normal mood and affect.     Leisl Spurrier L. BTarri Glenn MD, MPH 08/26/2021, 10:49 AM

## 2021-08-27 ENCOUNTER — Telehealth: Payer: Self-pay | Admitting: Pharmacy Technician

## 2021-08-27 ENCOUNTER — Other Ambulatory Visit (HOSPITAL_COMMUNITY): Payer: Self-pay

## 2021-09-09 NOTE — Progress Notes (Addendum)
Cardiology Office Note   Date:  09/10/2021   ID:  [name], DOB September 21, 1950, MRN [id number]  PCP:  Medicine, [facility]  Cardiologist:   Minus Breeding, MD    Chief Complaint  Patient presents with   MV Repair      History of Present Illness: [name] is a 71 y.o. male who presents for for follow up of lower extremity edema and MR.   He had palpitations and had a monitor last year.  This demonstrated no significant arrhythmias.  He has had no new cardiovascular complaints since I saw him.  He has had no further palpitations.  His wife complains that he is very sedentary watching TV all the time.  The patient denies any new symptoms such as chest discomfort, neck or arm discomfort. There has been no new shortness of breath, PND or orthopnea. There have been no reported palpitations, presyncope or syncope.    Past Medical History:  Diagnosis Date   Anxiety    Atrial fibrillation ([place])    postoperative   BPH (benign prostatic hypertrophy)    Chronic otitis media    left, with chronic TM perforation; also with bilateral conductive hearing loss (sees Dr. [name] at [facility])   CKD (chronic kidney disease), stage II 2015   Borderline stage II/III: CrCl'@60'$  ml/min   Gout    History of non anemic vitamin B12 deficiency 2012   Hypogonadism male 08/2010   Axiron started--?compliance? (repeat value 01/2011 mildly improved).   Mitral regurgitation 04/30/2012   Mitral Valve Prolapse with Severe Mitral Regurgitation    a. 01/2006 s/p MV Repair w/ 30 mm Edwards physio ring annuloplasty;  b. 06/2013 Echo: Ef 60-65%, no rwma, triv AI, dilated Ao root (53m), mild to mod MS, mild MR, mildly dil LA.   Obesity    Sleep apnea 07/04/2016   Urge incontinence    Viral URI 02/18/2014    Past Surgical History:  Procedure Laterality Date   COLONOSCOPY  2003   Normal per pt: Dr. DAnthony Sarin ELaurie 2000   doing fine since   MITRAL VALVE REPAIR  2007    Dr. ORoxy Manns  TRANSTHORACIC ECHOCARDIOGRAM  06/2012; 06/2013   moderate MR (MV repair)--stable compared to prior echo and pt is asymptomatic.  Repeat echo 06/2013: Stable MV repair.  Mild regurg.  Mild AO root dilatation. No change in therapy.     Current Outpatient Medications  Medication Sig Dispense Refill   allopurinol (ZYLOPRIM) 100 MG tablet Take 2 tablets by mouth daily.     aspirin 81 MG tablet Take 81 mg by mouth daily.     colchicine 0.6 MG tablet Take 0.6 mg by mouth as needed.     dexlansoprazole (DEXILANT) 60 MG capsule TAKE 1 CAPSULE BY MOUTH EVERY DAY 90 capsule 3   dicyclomine (BENTYL) 10 MG capsule TAKE 1 CAPSULE (10 MG TOTAL) BY MOUTH 4 TIMES A DAY BEFORE MEALS AND AT BEDTIME 360 capsule 1   famotidine (PEPCID) 20 MG tablet TAKE 1 TABLET (20 MG TOTAL) BY MOUTH 2 (TWO) TIMES DAILY. ONE AFTER SUPPER 180 tablet 0   levothyroxine (SYNTHROID) 25 MCG tablet TAKE 1 TABLET DAILY EVERY MORNING ON AND EMPTY STOMACH 30 TO 60 MIN BEFORE FOOD     metoprolol succinate (TOPROL-XL) 25 MG 24 hr tablet TAKE 1 TABLET BY MOUTH EVERY DAY 90 tablet 3   pantoprazole (PROTONIX) 40 MG tablet TAKE 1 TABLET BY  MOUTH TWICE A DAY 180 tablet 3   No current facility-administered medications for this visit.    Allergies:   Ivp dye [iodinated contrast media] and Prednisone     ROS:  Please see the history of present illness.   Otherwise, review of systems are positive for gout and orthostatic symptoms.   All other systems are reviewed and negative.    PHYSICAL EXAM: VS:  BP 115/68   Pulse 74   Ht '5\' 6"'$  (1.676 m)   Wt 192 lb 9.6 oz (87.4 kg)   SpO2 96%   BMI 31.09 kg/m  , BMI Body mass index is 31.09 kg/m.  GENERAL:  Well appearing NECK:  No jugular venous distention, waveform within normal limits, carotid upstroke brisk and symmetric, no bruits, no thyromegaly LUNGS:  Clear to auscultation bilaterally CHEST: Well-healed surgical scars HEART:  PMI not displaced or sustained,S1 and S2 within normal  limits, no S3, no S4, no clicks, no rubs, no murmurs ABD:  Flat, positive bowel sounds normal in frequency in pitch, no bruits, no rebound, no guarding, no midline pulsatile mass, no hepatomegaly, no splenomegaly EXT:  2 plus pulses throughout, no edema, no cyanosis no clubbing  EKG:  EKG is  ordered today. Sinus rhythm, rate 74, axis within normal limits, intervals within normal limits, incomplete right bundle branch block, leftward axis, nonspecific anterior T wave changes.  Recent Labs: No results found for requested labs within last 365 days.    Lipid Panel    Component Value Date/Time   CHOL 224 (H) 06/01/2012 0834   TRIG 108.0 06/01/2012 0834   HDL 28.10 (L) 06/01/2012 0834   CHOLHDL 8 06/01/2012 0834   VLDL 21.6 06/01/2012 0834   LDLDIRECT 180.9 06/01/2012 0834      Wt Readings from Last 3 Encounters:  09/10/21 192 lb 9.6 oz (87.4 kg)  08/26/21 193 lb 8 oz (87.8 kg)  03/24/21 192 lb (87.1 kg)      Other studies Reviewed: Additional studies/ records that were reviewed today include: Labs in Care Everywhere Review of the above records demonstrates:  See elsewhere   ASSESSMENT AND PLAN:   MV REPAIR:    I will check an echocardiogram as its been 2 years.  SLEEP APNEA: He has not been able to tolerate CPAP.  DYSLIPIDEMIA:  Reviewing the results from his primary care office I see that his LDL was 149 with an HDL of 30.  He is currently not on a statin.  I will defer to the PCP.  However, if he cannot make any changes to his diet that leads to a reduced LDL I would suggest starting a statin.   OBESITY: I will had a long discussion with him about exercise.  Current medicines are reviewed at length with the patient today.  The patient does not have concerns regarding medicines.  The following changes have been made:  None  Labs/ tests ordered today include:   None  Orders Placed This Encounter  Procedures   EKG 12-Lead   ECHOCARDIOGRAM COMPLETE      Disposition:   FU with me in 12 months.     Signed, Minus Breeding, MD  09/10/2021 10:48 AM    [facility]

## 2021-09-10 ENCOUNTER — Encounter: Payer: Self-pay | Admitting: Cardiology

## 2021-09-10 ENCOUNTER — Ambulatory Visit: Payer: Medicare HMO | Admitting: Cardiology

## 2021-09-10 VITALS — BP 115/68 | HR 74 | Ht 66.0 in | Wt 192.6 lb

## 2021-09-10 DIAGNOSIS — G4730 Sleep apnea, unspecified: Secondary | ICD-10-CM | POA: Diagnosis not present

## 2021-09-10 DIAGNOSIS — Z9889 Other specified postprocedural states: Secondary | ICD-10-CM | POA: Diagnosis not present

## 2021-09-10 DIAGNOSIS — I499 Cardiac arrhythmia, unspecified: Secondary | ICD-10-CM | POA: Diagnosis not present

## 2021-09-10 NOTE — Patient Instructions (Signed)
  Testing/Procedures:  Your physician has requested that you have an echocardiogram. Echocardiography is a painless test that uses sound waves to create images of your heart. It provides your doctor with information about the size and shape of your heart and how well your heart's chambers and valves are working. This procedure takes approximately one hour. There are no restrictions for this procedure. [place]   Follow-Up: At [facility], you and your health needs are our priority.  As part of our continuing mission to provide you with exceptional heart care, we have created designated Provider Care Teams.  These Care Teams include your primary Cardiologist (physician) and Advanced Practice Providers (APPs -  Physician Assistants and Nurse Practitioners) who all work together to provide you with the care you need, when you need it.  We recommend signing up for the patient portal called "MyChart".  Sign up information is provided on this After Visit Summary.  MyChart is used to connect with patients for Virtual Visits (Telemedicine).  Patients are able to view lab/test results, encounter notes, upcoming appointments, etc.  Non-urgent messages can be sent to your provider as well.   To learn more about what you can do with MyChart, go to [email or web address].    Your next appointment:   12 month(s)  The format for your next appointment:   In Person  Provider:   Minus Breeding MD      Important Information About Sugar

## 2021-09-27 ENCOUNTER — Ambulatory Visit (HOSPITAL_COMMUNITY): Payer: Medicare HMO | Attending: Cardiology

## 2021-09-27 DIAGNOSIS — Z9889 Other specified postprocedural states: Secondary | ICD-10-CM | POA: Insufficient documentation

## 2021-09-27 DIAGNOSIS — I4891 Unspecified atrial fibrillation: Secondary | ICD-10-CM | POA: Insufficient documentation

## 2021-09-27 DIAGNOSIS — I340 Nonrheumatic mitral (valve) insufficiency: Secondary | ICD-10-CM | POA: Diagnosis not present

## 2021-09-27 LAB — ECHOCARDIOGRAM COMPLETE
Area-P 1/2: 2.2 cm2
MV M vel: 5.46 m/s
MV Peak grad: 119.2 mmHg
MV VTI: 1.28 cm2
P 1/2 time: 503 msec
Radius: 0.7 cm
S' Lateral: 2.9 cm

## 2021-10-04 ENCOUNTER — Encounter: Payer: Self-pay | Admitting: *Deleted

## 2021-11-18 ENCOUNTER — Other Ambulatory Visit: Payer: Self-pay | Admitting: Gastroenterology

## 2021-11-18 DIAGNOSIS — K219 Gastro-esophageal reflux disease without esophagitis: Secondary | ICD-10-CM

## 2022-02-21 ENCOUNTER — Other Ambulatory Visit: Payer: Self-pay | Admitting: Cardiology

## 2022-05-29 ENCOUNTER — Other Ambulatory Visit: Payer: Self-pay | Admitting: Gastroenterology

## 2022-05-29 DIAGNOSIS — K219 Gastro-esophageal reflux disease without esophagitis: Secondary | ICD-10-CM

## 2022-10-09 NOTE — Progress Notes (Deleted)
  Cardiology Office Note:   Date:  10/09/2022  ID:  [name], DOB 1950-05-11, MRN [id number] PCP: Medicine, [facility] Providers Cardiologist:  Rollene Rotunda, MD {  History of Present Illness:   [name] is a 72 y.o. male who presents for for follow up of lower extremity edema and MR.   He had mild mitral regurgitation on echo July 2023.  ***     ***  He had palpitations and had a monitor last year.  This demonstrated no significant arrhythmias.  He has had no new cardiovascular complaints since I saw him.  He has had no further palpitations.  His wife complains that he is very sedentary watching TV all the time.  The patient denies any new symptoms such as chest discomfort, neck or arm discomfort. There has been no new shortness of breath, PND or orthopnea. There have been no reported palpitations, presyncope or syncope.   ROS: ***  Studies Reviewed:    EKG:       ***  Risk Assessment/Calculations:   {Does this patient have ATRIAL FIBRILLATION?:[phone]} No BP recorded.  {Refresh Note OR Click here to enter BP  :1}***        Physical Exam:   VS:  There were no vitals taken for this visit.   Wt Readings from Last 3 Encounters:  09/10/21 192 lb 9.6 oz (87.4 kg)  08/26/21 193 lb 8 oz (87.8 kg)  03/24/21 192 lb (87.1 kg)     GEN: Well nourished, well developed in no acute distress NECK: No JVD; No carotid bruits CARDIAC: ***RRR, no murmurs, rubs, gallops RESPIRATORY:  Clear to auscultation without rales, wheezing or rhonchi  ABDOMEN: Soft, non-tender, non-distended EXTREMITIES:  No edema; No deformity   ASSESSMENT AND PLAN:   MV REPAIR:   ***   I will check an echocardiogram as its been 2 years.   SLEEP APNEA:   ***  He has not been able to tolerate CPAP.   DYSLIPIDEMIA:  ***  Reviewing the results from his primary care office I see that his LDL was 149 with an HDL of 30.  He is currently not on a statin.  I will defer to  the PCP.  However, if he cannot make any changes to his diet that leads to a reduced LDL I would suggest starting a statin.    OBESITY:  ***   I will had a long discussion with him about exercise.    {Are you ordering a CV Procedure (e.g. stress test, cath, DCCV, TEE, etc)?   Press F2        :[id number]}  Follow up ***  Signed, Rollene Rotunda, MD

## 2022-10-10 ENCOUNTER — Ambulatory Visit: Payer: Medicare HMO | Attending: Cardiology | Admitting: Cardiology

## 2022-10-10 ENCOUNTER — Encounter: Payer: Self-pay | Admitting: Cardiology

## 2022-10-10 DIAGNOSIS — E785 Hyperlipidemia, unspecified: Secondary | ICD-10-CM

## 2022-10-10 DIAGNOSIS — Z9889 Other specified postprocedural states: Secondary | ICD-10-CM

## 2022-11-16 ENCOUNTER — Encounter: Payer: Self-pay | Admitting: Cardiology

## 2022-12-02 ENCOUNTER — Ambulatory Visit: Payer: Medicare HMO | Admitting: Cardiology

## 2023-03-11 NOTE — Progress Notes (Signed)
  Cardiology Office Note:   Date:  03/13/2023  ID:  [name], DOB June 09, 1950, MRN [id number] PCP: Medicine, [facility] Providers Cardiologist:  [name] Schilling, MD {  History of Present Illness:   [name] is a 73 y.o. male  who presents for for follow up of lower extremity edema and MR.  She had repair of the mitral valve with a ring.  An echo in July 2023 demonstrated mild to moderate MR     Since I last saw him he still working part-time for [employer].  The patient denies any new symptoms such as chest discomfort, neck or arm discomfort. There has been no new shortness of breath, PND or orthopnea. There have been no reported palpitations, presyncope or syncope.    He has fatigue but he has sleep apnea untreated.  He does not sleep well because he gets up to go to the bathroom a couple times a night.   ROS: Tinnitus, erectile dysfunction  Studies Reviewed:    EKG:   EKG Interpretation Date/Time:  Monday March 13 2023 11:15:47 EST Ventricular Rate:  78 PR Interval:  200 QRS Duration:  90 QT Interval:  366 QTC Calculation: 417 R Axis:   -23  Text Interpretation: Normal sinus rhythm ST & T wave changes, artifact RSR' or QR pattern in V1 suggests right ventricular conduction delay No significant change since last tracing Confirmed by Wolfe [name] ([id number]) on 03/13/2023 11:27:28 AM  .  Risk Assessment/Calculations:              Physical Exam:   VS:  BP 110/80 (BP Location: Left Arm, Patient Position: Sitting, Cuff Size: Normal)   Pulse 78   Ht 5' 6 (1.676 m)   Wt 189 lb 8 oz (86 kg)   SpO2 96%   BMI 30.59 kg/m    Wt Readings from Last 3 Encounters:  03/13/23 189 lb 8 oz (86 kg)  09/10/21 192 lb 9.6 oz (87.4 kg)  08/26/21 193 lb 8 oz (87.8 kg)     GEN: Well nourished, well developed in no acute distress NECK: No JVD; No carotid bruits CARDIAC: See oRRR, n murmurs, rubs, gallops RESPIRATORY:  Clear to auscultation without  rales, wheezing or rhonchi  ABDOMEN: Soft, non-tender, non-distended EXTREMITIES:  No edema; No deformity   ASSESSMENT AND PLAN:   MV REPAIR:    I will check an echocardiogram in July as it would have been 2 years.  He understands endocarditis prophylaxis.   SLEEP APNEA:   He has not been able to use CPAP and does not want to consider further management.   DYSLIPIDEMIA: I see an LDL of 129.  He has some coronary calcium  on a previous CT.  I like his goal LDL probably to be at least less than 100.  I am getting give him pravastatin  40 mg daily because he has lots of muscle aches and pains and I do not want to confuse the issue.  I do not think pravastatin  would cause any myositis.  We will check a lipid profile again in 3 months.  TINNITUS: I have offered to let him see ENT but he does not want to do this.    Follow up with me in one year.   Signed, [name] Schilling, MD

## 2023-03-13 ENCOUNTER — Ambulatory Visit: Payer: Medicare HMO | Attending: Cardiology | Admitting: Cardiology

## 2023-03-13 ENCOUNTER — Encounter: Payer: Self-pay | Admitting: Cardiology

## 2023-03-13 VITALS — BP 110/80 | HR 78 | Ht 66.0 in | Wt 189.5 lb

## 2023-03-13 DIAGNOSIS — E785 Hyperlipidemia, unspecified: Secondary | ICD-10-CM | POA: Diagnosis not present

## 2023-03-13 DIAGNOSIS — Z9889 Other specified postprocedural states: Secondary | ICD-10-CM | POA: Diagnosis not present

## 2023-03-13 DIAGNOSIS — G4730 Sleep apnea, unspecified: Secondary | ICD-10-CM

## 2023-03-13 MED ORDER — PRAVASTATIN SODIUM 40 MG PO TABS: 40.0000 mg | ORAL_TABLET | Freq: Every evening | ORAL | 3 refills | Status: AC

## 2023-03-13 NOTE — Patient Instructions (Addendum)
 Medication Instructions:  Start Pravachol  40 mg by mouth daily. New script sent to [facility] *If you need a refill on your cardiac medications before your next appointment, please call your pharmacy*   Lab Work: Fasting Lipid profile in 3 months. Due April 2025. If you have labs (blood work) drawn today and your tests are completely normal, you will receive your results only by: MyChart Message (if you have MyChart) OR A paper copy in the mail If you have any lab test that is abnormal or we need to change your treatment, we will call you to review the results.   Testing/Procedures: Your physician has requested that you have an echocardiogram in July 2025. Echocardiography is a painless test that uses sound waves to create images of your heart. It provides your doctor with information about the size and shape of your heart and how well your heart's chambers and valves are working. This procedure takes approximately one hour. There are no restrictions for this procedure.  [address] Please do NOT wear cologne, perfume, aftershave, or lotions (deodorant is allowed). Please arrive 15 minutes prior to your appointment time.  Please note: We ask at that you not bring children with you during ultrasound (echo/ vascular) testing. Due to room size and safety concerns, children are not allowed in the ultrasound rooms during exams. Our front office staff cannot provide observation of children in our lobby area while testing is being conducted. An adult accompanying a patient to their appointment will only be allowed in the ultrasound room at the discretion of the ultrasound technician under special circumstances. We apologize for any inconvenience.    Follow-Up: At [facility], you and your health needs are our priority.  As part of our continuing mission to provide you with exceptional heart care, we have created designated Provider Care Teams.  These Care Teams include your primary  Cardiologist (physician) and Advanced Practice Providers (APPs -  Physician Assistants and Nurse Practitioners) who all work together to provide you with the care you need, when you need it.  We recommend signing up for the patient portal called MyChart.  Sign up information is provided on this After Visit Summary.  MyChart is used to connect with patients for Virtual Visits (Telemedicine).  Patients are able to view lab/test results, encounter notes, upcoming appointments, etc.  Non-urgent messages can be sent to your provider as well.   To learn more about what you can do with MyChart, go to [email or web address].    Your next appointment:   12 month(s)  Provider:   Lynwood Schilling, MD

## 2023-09-11 ENCOUNTER — Ambulatory Visit (HOSPITAL_COMMUNITY)
Admission: RE | Admit: 2023-09-11 | Discharge: 2023-09-11 | Disposition: A | Discharge status: Home / Self Care | Payer: Medicare HMO | Source: Ambulatory Visit | Attending: Cardiology | Admitting: Cardiology

## 2023-09-11 DIAGNOSIS — I080 Rheumatic disorders of both mitral and aortic valves: Secondary | ICD-10-CM | POA: Diagnosis not present

## 2023-09-11 DIAGNOSIS — Z9889 Other specified postprocedural states: Secondary | ICD-10-CM | POA: Diagnosis present

## 2023-09-11 DIAGNOSIS — I4891 Unspecified atrial fibrillation: Secondary | ICD-10-CM | POA: Diagnosis not present

## 2023-09-11 DIAGNOSIS — G4730 Sleep apnea, unspecified: Secondary | ICD-10-CM | POA: Insufficient documentation

## 2023-09-11 LAB — ECHOCARDIOGRAM COMPLETE
Area-P 1/2: 2.68 cm2
MV M vel: 5.42 m/s
MV Peak grad: 117.5 mmHg
MV VTI: 1.15 cm2
P 1/2 time: 538 ms
S' Lateral: 3.3 cm

## 2023-09-16 ENCOUNTER — Ambulatory Visit: Payer: Self-pay | Admitting: Cardiology

## 2024-04-04 ENCOUNTER — Emergency Department (HOSPITAL_COMMUNITY)

## 2024-04-04 ENCOUNTER — Other Ambulatory Visit: Payer: Self-pay

## 2024-04-04 ENCOUNTER — Emergency Department (HOSPITAL_COMMUNITY): Admission: EM | Admit: 2024-04-04 | Discharge: 2024-04-04 | Disposition: A | Discharge status: Home / Self Care

## 2024-04-04 DIAGNOSIS — S065X0A Traumatic subdural hemorrhage without loss of consciousness, initial encounter: Secondary | ICD-10-CM | POA: Insufficient documentation

## 2024-04-04 DIAGNOSIS — W01198A Fall on same level from slipping, tripping and stumbling with subsequent striking against other object, initial encounter: Secondary | ICD-10-CM | POA: Diagnosis not present

## 2024-04-04 DIAGNOSIS — Z79899 Other long term (current) drug therapy: Secondary | ICD-10-CM | POA: Insufficient documentation

## 2024-04-04 DIAGNOSIS — S065XAA Traumatic subdural hemorrhage with loss of consciousness status unknown, initial encounter: Secondary | ICD-10-CM

## 2024-04-04 DIAGNOSIS — Z7982 Long term (current) use of aspirin: Secondary | ICD-10-CM | POA: Insufficient documentation

## 2024-04-04 DIAGNOSIS — S0990XA Unspecified injury of head, initial encounter: Secondary | ICD-10-CM | POA: Diagnosis present

## 2024-04-04 MED ORDER — ACETAMINOPHEN 325 MG PO TABS
650.0000 mg | ORAL_TABLET | Freq: Once | ORAL | Status: AC
  Administered 2024-04-04: 650 mg via ORAL
  Filled 2024-04-04: qty 2

## 2024-04-04 NOTE — ED Provider Notes (Signed)
 " [place] EMERGENCY DEPARTMENT AT [facility] Provider Note   CSN: [id number] Arrival date & time: 04/04/24  1616     Patient presents with: [name] is a 74 y.o. male patient who presents to the emergency department today for further evaluation of head injury that occurred just prior to arrival.  Patient slipped on some ice and fell backward hitting his head on a block of ice.  Questionably lost consciousness.  He denies any nausea, vomiting, focal weakness, focal numbness.  Takes aspirin daily.  No other anticoagulants.    Fall       Prior to Admission medications  Medication Sig Start Date End Date Taking? Authorizing Provider  allopurinol  (ZYLOPRIM ) 100 MG tablet Take 2 tablets by mouth daily. 05/26/20   [provider]  aspirin 81 MG tablet Take 81 mg by mouth daily.    [provider]  colchicine 0.6 MG tablet Take 0.6 mg by mouth as needed.    [provider]  dexlansoprazole  (DEXILANT ) 60 MG capsule TAKE 1 CAPSULE BY MOUTH EVERY DAY 08/27/21   Eda Iha, MD  dicyclomine  (BENTYL ) 10 MG capsule TAKE 1 CAPSULE (10 MG TOTAL) BY MOUTH 4 TIMES A DAY BEFORE MEALS AND AT BEDTIME 06/18/21   Eda Iha, MD  famotidine  (PEPCID ) 20 MG tablet TAKE 1 TABLET (20 MG TOTAL) BY MOUTH 2 (TWO) TIMES DAILY. ONE AFTER SUPPER 05/29/22   Eda Iha, MD  levothyroxine (SYNTHROID) 25 MCG tablet TAKE 1 TABLET DAILY EVERY MORNING ON AND EMPTY STOMACH 30 TO 60 MIN BEFORE FOOD 07/13/20   [provider]  metoprolol  succinate (TOPROL -XL) 25 MG 24 hr tablet TAKE 1 TABLET BY MOUTH EVERY DAY 02/23/22   Lavona Lynwood, MD  pantoprazole  (PROTONIX ) 40 MG tablet TAKE 1 TABLET BY MOUTH TWICE A DAY 07/12/21   Kerman Vina HERO, NP  pravastatin  (PRAVACHOL ) 40 MG tablet Take 1 tablet (40 mg total) by mouth every evening. 03/13/23 06/11/23  Lavona Lynwood, MD  tiZANidine (ZANAFLEX) 4 MG tablet Take by mouth as needed. 02/28/23   [provider]    Allergies: Ivp dye [iodinated contrast media] and Prednisone     Review of Systems  All other systems reviewed and are negative.   Updated Vital Signs BP 115/67   Pulse 70   Temp 98.5 F (36.9 C) (Oral)   Resp 16   Ht 5' 6 (1.676 m)   Wt 82.1 kg   SpO2 95%   BMI 29.21 kg/m   Physical Exam Vitals and nursing note reviewed.  Constitutional:      Appearance: Normal appearance.  HENT:     Head: Normocephalic and atraumatic.     Comments: No bruising or open wounds or hematoma to the occipital scalp. Eyes:     General:        Right eye: No discharge.        Left eye: No discharge.     Conjunctiva/sclera: Conjunctivae normal.  Pulmonary:     Effort: Pulmonary effort is normal.  Skin:    General: Skin is warm and dry.     Findings: No rash.  Neurological:     General: No focal deficit present.     Mental Status: He is alert.     Comments: Cranial nerves II to XII are intact.  5/5 strength to the upper and lower extremities.  Normal sensation of the upper and lower extremities.  Speech is normal.  Psychiatric:  Mood and Affect: Mood normal.        Behavior: Behavior normal.     (all labs ordered are listed, but only abnormal results are displayed) Labs Reviewed - No data to display  EKG: None  Radiology: CT Head Wo Contrast Result Date: 04/04/2024 EXAM: CT HEAD WITHOUT CONTRAST 04/04/2024 10:12:15 PM TECHNIQUE: CT of the head was performed without the administration of intravenous contrast. Automated exposure control, iterative reconstruction, and/or weight based adjustment of the mA/kV was utilized to reduce the radiation dose to as low as reasonably achievable. COMPARISON: CT from earlier in the same day. CLINICAL HISTORY: Right tentorial subdural hematoma. FINDINGS: BRAIN AND VENTRICLES: Persistent right tentorial subdural hematoma is noted without significant mass effect. No new areas of acute hemorrhage are seen. No evidence of acute  infarct. No mass effect or midline shift. ORBITS: No acute abnormality. SINUSES: No acute abnormality. SOFT TISSUES AND SKULL: No acute soft tissue abnormality. No skull fracture. IMPRESSION: 1. Persistent right tentorial subdural hematoma without significant mass effect. 2. No new areas of acute hemorrhage. Electronically signed by: Oneil Devonshire MD 04/04/2024 10:15 PM EST RP Workstation: [id number]   CT Cervical Spine Wo Contrast Result Date: 04/04/2024 EXAM: CT CERVICAL SPINE WITHOUT CONTRAST 04/04/2024 05:02:25 PM TECHNIQUE: CT of the cervical spine was performed without the administration of intravenous contrast. Multiplanar reformatted images are provided for review. Automated exposure control, iterative reconstruction, and/or weight based adjustment of the mA/kV was utilized to reduce the radiation dose to as low as reasonably achievable. COMPARISON: None available. CLINICAL HISTORY: Recent slip and fall with neck pain. FINDINGS: BONES AND ALIGNMENT: 7 cervical segments are well visualized. Vertebral body heights and alignment are well maintained. No acute fracture or acute facet abnormality is noted. Posterior fusion defect of C1 is noted, congenital in nature. DEGENERATIVE CHANGES: Mild osteophytic changes are seen. Facet hypertrophic changes are noted. SOFT TISSUES: No soft tissue abnormalities noted. The visualized lung apices are within normal limits. IMPRESSION: 1. Mild degenerative changes of the cervical spine without acute abnormality. Electronically signed by: Oneil Devonshire MD 04/04/2024 05:59 PM EST RP Workstation: [id number]   CT Head Wo Contrast Result Date: 04/04/2024 EXAM: CT HEAD WITHOUT CONTRAST 04/04/2024 05:02:25 PM TECHNIQUE: CT of the head was performed without the administration of intravenous contrast. Automated exposure control, iterative reconstruction, and/or weight based adjustment of the mA/kV was utilized to reduce the radiation dose to as low as reasonably achievable.  COMPARISON: 02/14/2010. CLINICAL HISTORY: Head trauma, moderate-severe. FINDINGS: BRAIN AND VENTRICLES: Hyperdense thickening of the right tentorium is noted, likely related to a small subdural hematoma. This measures 3 mm in thickness. No significant mass effect is noted. No midline shift is seen. No evidence of acute infarct. No hydrocephalus. ORBITS: No acute abnormality. SINUSES: No acute abnormality. 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 The fluid is new from the prior exam. No acute soft tissue abnormality. No skull fracture. IMPRESSION: 1. Small right tentorial subdural hematoma without significant mass effect or midline shift. 2. New fluid within the bony defect of the left mastoidectomy. Critical findings were discussed with PA Theotis at 5:54 pm EST on  04/04/24 Electronically signed by: Oneil Devonshire MD 04/04/2024 05:57 PM EST RP Workstation: [id number]     Procedures   Medications Ordered in the ED  acetaminophen  (TYLENOL ) tablet 650 mg (650 mg Oral Given 04/04/24 1729)    Clinical Course as of 04/04/24 2244  Thu Apr 04, 2024  1850 Patient  is GCS 15 at this time.  Repeat neuroexam is normal.  Will plan to continue with 6-hour observation and repeat CT scan given mBIG 1 criteria. [CF]  2228 CT Head Wo Contrast There is evidence of subdural hematoma.  I spoke with radiology about this.  It is small and according to mBIG criteria he is 1. [CF]  2229 CT Cervical Spine Wo Contrast No evidence of spinal fracture.  I do agree with radiologist interpretation. [CF]  2229 CT Head Wo Contrast Repeat scan 6 hours later remains unchanged.  Still small subdural hematoma. I do agree with the radiologist interpretation.  [CF]  2237 Repeat neuroexam is completely normal.  He answers all questions appropriately.  Wife is at bedside.  We discussed red flag symptoms including when to return to the emergency department this included severe headache,  vision changes, trouble speaking, expressive aphasia, focal weakness, focal numbness, and of course any other concerns they might have.  All questions or concerns addressed.  Again patient is currently at baseline GCS 15 and safe for discharge.  He will follow-up with neurosurgery in 1 to 2 weeks. [CF]    Clinical Course User Index [CF] Theotis Cameron HERO, PA-C    Medical Decision Making [name] is a 74 y.o. male patient who presents to the emergency department today for further evaluation of a mechanical slip and fall on some ice.  Obviously given the patient's age and mechanism of injury looking for signs of intracranial hemorrhage, cervical spinal pathology.  Patient is not anticoagulated.  Will get a CT head Noncon and CT cervical spine.  Initial CT scan did reveal small subdural hematoma 3 mm.  I spoke with radiology about this.  As highlighted in the ED course, we reviewed the mBIG criteria and he meets criteria for category 1.  Will plan to observe for 6 hours here in the emergency department.  I will also get a repeat CT scan at the 6-hour mark to look for any interval changes.  Patient had repeat neurological exams at the 3 and 6-hour mark which were both normal.  He is acting appropriately and at baseline.  Wife is at bedside and agrees.  Repeat CT scan did not show any new bleeding in the brain.  Subdural remains unchanged.  At this time, we will plan to have him follow-up with neurosurgery in the outpatient setting.  Patient and wife at bedside agreeable with plan.  We discussed red flag symptoms at length.  All questions and concerns addressed.  Strict return precautions were discussed.  Patient is overall safe for discharge at this time.   Amount and/or Complexity of Data Reviewed Radiology: ordered. Decision-making details documented in ED Course.  Risk OTC drugs.    Final diagnoses:  Subdural hematoma [facility])    ED Discharge Orders     None          Theotis Cameron HERO, [place] 04/04/24 2244    Gennaro Duwaine CROME, DO 04/06/24 1449  "

## 2024-04-04 NOTE — Discharge Instructions (Signed)
 I would like for you to follow-up with neurosurgery in 1 week with regards to the bleeding in your brain.  The repeat scan as we discussed after 6-hour observation was unchanged which is great news.  Please return to the emergency department for any worsening symptoms including severe headaches, vision changes, weakness or numbness in your arms or legs, or any other concerns you may have.

## 2024-04-04 NOTE — ED Triage Notes (Signed)
 Pt fell backwards onto his head on ice in the yard. Pt only takes ASA. No bleeding , abrasions or bruising noted at this time.

## 2024-04-04 NOTE — ED Notes (Signed)
 Patient transported to CT

## 2024-04-11 ENCOUNTER — Other Ambulatory Visit: Payer: Self-pay

## 2024-04-11 DIAGNOSIS — S065XAA Traumatic subdural hemorrhage with loss of consciousness status unknown, initial encounter: Secondary | ICD-10-CM

## 2024-04-15 ENCOUNTER — Other Ambulatory Visit
